# Patient Record
Sex: Female | Born: 1965 | Race: White | Hispanic: No | Marital: Married | Smoking: Current every day smoker
Health system: Southern US, Community
[De-identification: ages and names within clinical notes are randomized; demographics above are authoritative.]

## PROBLEM LIST (undated history)

## (undated) DIAGNOSIS — R52 Pain, unspecified: Secondary | ICD-10-CM

## (undated) DIAGNOSIS — F4310 Post-traumatic stress disorder, unspecified: Secondary | ICD-10-CM

## (undated) DIAGNOSIS — I444 Left anterior fascicular block: Secondary | ICD-10-CM

## (undated) DIAGNOSIS — G4730 Sleep apnea, unspecified: Secondary | ICD-10-CM

## (undated) DIAGNOSIS — I2510 Atherosclerotic heart disease of native coronary artery without angina pectoris: Secondary | ICD-10-CM

## (undated) DIAGNOSIS — F32A Depression, unspecified: Secondary | ICD-10-CM

## (undated) DIAGNOSIS — R519 Headache, unspecified: Secondary | ICD-10-CM

## (undated) DIAGNOSIS — I10 Essential (primary) hypertension: Secondary | ICD-10-CM

## (undated) DIAGNOSIS — I350 Nonrheumatic aortic (valve) stenosis: Secondary | ICD-10-CM

## (undated) DIAGNOSIS — F329 Major depressive disorder, single episode, unspecified: Secondary | ICD-10-CM

## (undated) DIAGNOSIS — R51 Headache: Secondary | ICD-10-CM

## (undated) DIAGNOSIS — E119 Type 2 diabetes mellitus without complications: Secondary | ICD-10-CM

## (undated) DIAGNOSIS — I509 Heart failure, unspecified: Secondary | ICD-10-CM

## (undated) HISTORY — DX: Post-traumatic stress disorder, unspecified: F43.10

## (undated) HISTORY — DX: Nonrheumatic aortic (valve) stenosis: I35.0

## (undated) HISTORY — DX: Sleep apnea, unspecified: G47.30

## (undated) HISTORY — PX: ADENOIDECTOMY: SUR15

## (undated) HISTORY — DX: Atherosclerotic heart disease of native coronary artery without angina pectoris: I25.10

## (undated) HISTORY — PX: TONSILLECTOMY: SUR1361

## (undated) HISTORY — DX: Left anterior fascicular block: I44.4

---

## 2004-01-01 ENCOUNTER — Emergency Department (HOSPITAL_COMMUNITY): Admission: EM | Admit: 2004-01-01 | Discharge: 2004-01-01 | Payer: Self-pay | Admitting: Family Medicine

## 2004-10-16 ENCOUNTER — Emergency Department (HOSPITAL_COMMUNITY): Admission: EM | Admit: 2004-10-16 | Discharge: 2004-10-16 | Payer: Self-pay | Admitting: Family Medicine

## 2004-12-10 ENCOUNTER — Emergency Department (HOSPITAL_COMMUNITY): Admission: EM | Admit: 2004-12-10 | Discharge: 2004-12-10 | Payer: Self-pay | Admitting: Emergency Medicine

## 2004-12-21 ENCOUNTER — Emergency Department (HOSPITAL_COMMUNITY): Admission: EM | Admit: 2004-12-21 | Discharge: 2004-12-21 | Payer: Self-pay | Admitting: Emergency Medicine

## 2004-12-21 ENCOUNTER — Emergency Department (HOSPITAL_COMMUNITY): Admission: EM | Admit: 2004-12-21 | Discharge: 2004-12-21 | Payer: Self-pay | Admitting: Family Medicine

## 2007-07-10 ENCOUNTER — Emergency Department (HOSPITAL_COMMUNITY): Admission: EM | Admit: 2007-07-10 | Discharge: 2007-07-10 | Payer: Self-pay | Admitting: Emergency Medicine

## 2008-03-20 ENCOUNTER — Emergency Department (HOSPITAL_COMMUNITY): Admission: EM | Admit: 2008-03-20 | Discharge: 2008-03-20 | Payer: Self-pay | Admitting: Emergency Medicine

## 2008-04-02 ENCOUNTER — Emergency Department (HOSPITAL_COMMUNITY): Admission: EM | Admit: 2008-04-02 | Discharge: 2008-04-02 | Payer: Self-pay | Admitting: Emergency Medicine

## 2008-06-22 ENCOUNTER — Emergency Department (HOSPITAL_COMMUNITY): Admission: EM | Admit: 2008-06-22 | Discharge: 2008-06-22 | Payer: Self-pay | Admitting: Emergency Medicine

## 2008-11-08 ENCOUNTER — Emergency Department (HOSPITAL_COMMUNITY): Admission: EM | Admit: 2008-11-08 | Discharge: 2008-11-08 | Payer: Self-pay | Admitting: Emergency Medicine

## 2010-12-08 LAB — DIFFERENTIAL
Basophils Absolute: 0.1
Basophils Relative: 0
Eosinophils Absolute: 0.4
Lymphs Abs: 1.8
Neutro Abs: 14 — ABNORMAL HIGH
Neutrophils Relative %: 82 — ABNORMAL HIGH

## 2010-12-08 LAB — CBC
HCT: 42.2
MCHC: 33.9
Platelets: 262
RDW: 13.1

## 2011-01-19 ENCOUNTER — Encounter: Payer: Self-pay | Admitting: *Deleted

## 2011-01-19 ENCOUNTER — Emergency Department (HOSPITAL_COMMUNITY)
Admission: EM | Admit: 2011-01-19 | Discharge: 2011-01-19 | Disposition: A | Discharge status: Home / Self Care | Payer: Self-pay | Attending: Emergency Medicine | Admitting: Emergency Medicine

## 2011-01-19 ENCOUNTER — Emergency Department (HOSPITAL_COMMUNITY): Payer: Self-pay

## 2011-01-19 DIAGNOSIS — R0602 Shortness of breath: Secondary | ICD-10-CM | POA: Insufficient documentation

## 2011-01-19 DIAGNOSIS — J40 Bronchitis, not specified as acute or chronic: Secondary | ICD-10-CM | POA: Insufficient documentation

## 2011-01-19 DIAGNOSIS — R059 Cough, unspecified: Secondary | ICD-10-CM | POA: Insufficient documentation

## 2011-01-19 DIAGNOSIS — R5381 Other malaise: Secondary | ICD-10-CM | POA: Insufficient documentation

## 2011-01-19 DIAGNOSIS — J45909 Unspecified asthma, uncomplicated: Secondary | ICD-10-CM | POA: Insufficient documentation

## 2011-01-19 DIAGNOSIS — R05 Cough: Secondary | ICD-10-CM | POA: Insufficient documentation

## 2011-01-19 DIAGNOSIS — R5383 Other fatigue: Secondary | ICD-10-CM | POA: Insufficient documentation

## 2011-01-19 HISTORY — DX: Pain, unspecified: R52

## 2011-01-19 MED ORDER — PREDNISONE 10 MG PO TABS: 20.0000 mg | ORAL_TABLET | Freq: Every day | ORAL | Status: AC

## 2011-01-19 MED ORDER — AZITHROMYCIN 250 MG PO TABS: 250.0000 mg | ORAL_TABLET | Freq: Every day | ORAL | Status: AC

## 2011-01-19 MED ORDER — ALBUTEROL SULFATE (5 MG/ML) 0.5% IN NEBU
5.0000 mg | INHALATION_SOLUTION | RESPIRATORY_TRACT | Status: AC
  Administered 2011-01-19: 5 mg via RESPIRATORY_TRACT
  Filled 2011-01-19 (×2): qty 0.5

## 2011-01-19 MED ORDER — IPRATROPIUM BROMIDE 0.02 % IN SOLN
0.5000 mg | RESPIRATORY_TRACT | Status: DC
  Administered 2011-01-19: 0.5 mg via RESPIRATORY_TRACT
  Filled 2011-01-19: qty 2.5

## 2011-01-19 MED ORDER — ALBUTEROL SULFATE HFA 108 (90 BASE) MCG/ACT IN AERS
2.0000 | INHALATION_SPRAY | Freq: Once | RESPIRATORY_TRACT | Status: AC
  Administered 2011-01-19: 2 via RESPIRATORY_TRACT
  Filled 2011-01-19: qty 6.7

## 2011-01-19 MED ORDER — ALBUTEROL SULFATE (5 MG/ML) 0.5% IN NEBU
2.5000 mg | INHALATION_SOLUTION | RESPIRATORY_TRACT | Status: DC
  Administered 2011-01-19: 2.5 mg via RESPIRATORY_TRACT
  Filled 2011-01-19: qty 0.5

## 2011-01-19 NOTE — ED Notes (Signed)
Patient states she noted onset of feeling like she had the flu on yesterday.  She states she noted her chest getting tight,  She is having increased shortness of breath.  Patient reports cough as well.  Patient denies fever.  Patient states she has not needed inhaler for over 1 year

## 2011-01-19 NOTE — ED Provider Notes (Signed)
History     CSN: [id number] Arrival date & time: 01/19/2011 12:54 PM   First MD Initiated Contact with Patient 01/19/11 1516      Chief Complaint  Patient presents with  . Shortness of Breath    (Consider location/radiation/quality/duration/timing/severity/associated sxs/prior treatment) Patient is a 45 y.o. female presenting with shortness of breath. The history is provided by the patient.  Shortness of Breath  The current episode started 2 days ago. The problem occurs occasionally. The problem has been gradually worsening. The problem is moderate. The symptoms are relieved by nothing. The symptoms are aggravated by activity. Associated symptoms include cough, shortness of breath and wheezing. Pertinent negatives include no chest pain, no chest pressure and no fever. She has had no prior steroid use. Her past medical history is significant for asthma and past wheezing. She has been behaving normally.    Past Medical History  Diagnosis Date  . Asthma   . Pain disorder     Past Surgical History  Procedure Date  . Tonsillectomy   . Adenoidectomy     No family history on file.  History  Substance Use Topics  . Smoking status: Current Everyday Smoker  . Smokeless tobacco: Not on file  . Alcohol Use: Yes    OB History    Grav Para Term Preterm Abortions TAB SAB Ect Mult Living                  Review of Systems  Constitutional: Positive for fatigue. Negative for fever and chills.  HENT: Negative for neck pain and neck stiffness.   Respiratory: Positive for cough, shortness of breath and wheezing.   Cardiovascular: Negative for chest pain.  Gastrointestinal: Negative for abdominal pain, diarrhea, constipation and abdominal distention.  All other systems reviewed and are negative.    Allergies  Review of patient's allergies indicates no known allergies.  Home Medications   Current Outpatient Rx  Name Route Sig Dispense Refill  . BC HEADACHE POWDER PO Oral Take  1 packet by mouth 4 (four) times daily as needed. For pain     . ALBUTEROL SULFATE HFA 108 (90 BASE) MCG/ACT IN AERS Inhalation Inhale 2 puffs into the lungs every 4 (four) hours as needed. For shortness of breath       BP 164/104  Pulse 100  Temp 98.5 F (36.9 C)  Resp 24  SpO2 95%  Physical Exam  Constitutional: She is oriented to person, place, and time. She appears well-developed and well-nourished. No distress.  HENT:  Head: Atraumatic.  Neck: Normal range of motion. Neck supple.  Cardiovascular: Normal rate and regular rhythm.  Exam reveals no gallop and no friction rub.   No murmur heard. Pulmonary/Chest: No respiratory distress. She has wheezes. She has no rales.  Abdominal: Soft. Bowel sounds are normal. She exhibits no distension. There is no tenderness.  Musculoskeletal: Normal range of motion.  Neurological: She is alert and oriented to person, place, and time.  Skin: Skin is warm and dry. She is not diaphoretic.    ED Course  Procedures (including critical care time)  Labs Reviewed - No data to display No results found.   No diagnosis found.    MDM  Bronchitis, will treat with antibiotics, mdi, steroids.        Geoffery Lyons, MD 01/19/11 [phone]

## 2011-01-19 NOTE — ED Notes (Signed)
Pt taken to xray without notifying ED staff prior to pt being marked ready for transport.  Due to that a Margo Aye ass was not generated prior to pt leaving dept.

## 2014-06-17 ENCOUNTER — Inpatient Hospital Stay (HOSPITAL_COMMUNITY): Payer: Self-pay

## 2014-06-17 ENCOUNTER — Emergency Department (HOSPITAL_COMMUNITY): Payer: Self-pay

## 2014-06-17 ENCOUNTER — Inpatient Hospital Stay (HOSPITAL_COMMUNITY)
Admission: EM | Admit: 2014-06-17 | Discharge: 2014-06-20 | DRG: 292 | Disposition: A | Discharge status: Home / Self Care | Payer: Self-pay | Attending: Internal Medicine | Admitting: Internal Medicine

## 2014-06-17 ENCOUNTER — Encounter (HOSPITAL_COMMUNITY): Payer: Self-pay

## 2014-06-17 DIAGNOSIS — I5031 Acute diastolic (congestive) heart failure: Secondary | ICD-10-CM | POA: Diagnosis present

## 2014-06-17 DIAGNOSIS — Z720 Tobacco use: Secondary | ICD-10-CM | POA: Diagnosis present

## 2014-06-17 DIAGNOSIS — G4733 Obstructive sleep apnea (adult) (pediatric): Secondary | ICD-10-CM | POA: Diagnosis present

## 2014-06-17 DIAGNOSIS — M549 Dorsalgia, unspecified: Secondary | ICD-10-CM | POA: Insufficient documentation

## 2014-06-17 DIAGNOSIS — E662 Morbid (severe) obesity with alveolar hypoventilation: Secondary | ICD-10-CM | POA: Diagnosis present

## 2014-06-17 DIAGNOSIS — Z6841 Body Mass Index (BMI) 40.0 and over, adult: Secondary | ICD-10-CM

## 2014-06-17 DIAGNOSIS — I152 Hypertension secondary to endocrine disorders: Secondary | ICD-10-CM | POA: Diagnosis present

## 2014-06-17 DIAGNOSIS — I509 Heart failure, unspecified: Secondary | ICD-10-CM

## 2014-06-17 DIAGNOSIS — I10 Essential (primary) hypertension: Secondary | ICD-10-CM | POA: Diagnosis present

## 2014-06-17 DIAGNOSIS — R0789 Other chest pain: Secondary | ICD-10-CM | POA: Diagnosis present

## 2014-06-17 DIAGNOSIS — E119 Type 2 diabetes mellitus without complications: Secondary | ICD-10-CM | POA: Diagnosis present

## 2014-06-17 DIAGNOSIS — J449 Chronic obstructive pulmonary disease, unspecified: Secondary | ICD-10-CM | POA: Diagnosis present

## 2014-06-17 DIAGNOSIS — J45909 Unspecified asthma, uncomplicated: Secondary | ICD-10-CM | POA: Diagnosis present

## 2014-06-17 DIAGNOSIS — F17210 Nicotine dependence, cigarettes, uncomplicated: Secondary | ICD-10-CM | POA: Diagnosis present

## 2014-06-17 DIAGNOSIS — M545 Low back pain: Secondary | ICD-10-CM | POA: Diagnosis present

## 2014-06-17 DIAGNOSIS — I5033 Acute on chronic diastolic (congestive) heart failure: Principal | ICD-10-CM | POA: Diagnosis present

## 2014-06-17 DIAGNOSIS — G8929 Other chronic pain: Secondary | ICD-10-CM | POA: Diagnosis present

## 2014-06-17 DIAGNOSIS — I248 Other forms of acute ischemic heart disease: Secondary | ICD-10-CM | POA: Diagnosis present

## 2014-06-17 HISTORY — DX: Headache, unspecified: R51.9

## 2014-06-17 HISTORY — DX: Heart failure, unspecified: I50.9

## 2014-06-17 HISTORY — DX: Headache: R51

## 2014-06-17 HISTORY — DX: Depression, unspecified: F32.A

## 2014-06-17 HISTORY — DX: Major depressive disorder, single episode, unspecified: F32.9

## 2014-06-17 HISTORY — DX: Essential (primary) hypertension: I10

## 2014-06-17 LAB — BASIC METABOLIC PANEL
Anion gap: 10 (ref 5–15)
BUN: 9 mg/dL (ref 6–23)
CALCIUM: 9 mg/dL (ref 8.4–10.5)
CO2: 25 mmol/L (ref 19–32)
CREATININE: 0.69 mg/dL (ref 0.50–1.10)
Chloride: 102 mmol/L (ref 96–112)
Glucose, Bld: 108 mg/dL — ABNORMAL HIGH (ref 70–99)
POTASSIUM: 4.1 mmol/L (ref 3.5–5.1)
Sodium: 137 mmol/L (ref 135–145)

## 2014-06-17 LAB — CBC
HEMATOCRIT: 46.8 % — AB (ref 36.0–46.0)
Hemoglobin: 15.1 g/dL — ABNORMAL HIGH (ref 12.0–15.0)
MCH: 31.8 pg (ref 26.0–34.0)
MCHC: 32.3 g/dL (ref 30.0–36.0)
MCV: 98.5 fL (ref 78.0–100.0)
PLATELETS: 209 10*3/uL (ref 150–400)
RBC: 4.75 MIL/uL (ref 3.87–5.11)
RDW: 14.6 % (ref 11.5–15.5)
WBC: 8.9 10*3/uL (ref 4.0–10.5)

## 2014-06-17 LAB — TROPONIN I: Troponin I: 0.06 ng/mL — ABNORMAL HIGH (ref <0.031)

## 2014-06-17 LAB — I-STAT TROPONIN, ED: TROPONIN I, POC: 0.01 ng/mL (ref 0.00–0.08)

## 2014-06-17 LAB — BRAIN NATRIURETIC PEPTIDE: B NATRIURETIC PEPTIDE 5: 212.4 pg/mL — AB (ref 0.0–100.0)

## 2014-06-17 LAB — TSH: TSH: 0.349 u[IU]/mL — ABNORMAL LOW (ref 0.350–4.500)

## 2014-06-17 MED ORDER — SODIUM CHLORIDE 0.9 % IV SOLN: 250.0000 mL | INTRAVENOUS | Status: DC | PRN

## 2014-06-17 MED ORDER — PREDNISONE 20 MG PO TABS
60.0000 mg | ORAL_TABLET | Freq: Once | ORAL | Status: AC
Start: 2014-06-17 — End: 2014-06-17
  Administered 2014-06-17: 60 mg via ORAL
  Filled 2014-06-17: qty 3

## 2014-06-17 MED ORDER — ASPIRIN EC 81 MG PO TBEC
81.0000 mg | DELAYED_RELEASE_TABLET | Freq: Every day | ORAL | Status: DC
  Administered 2014-06-18 – 2014-06-20 (×3): 81 mg via ORAL
  Filled 2014-06-17 (×4): qty 1

## 2014-06-17 MED ORDER — ACETAMINOPHEN 325 MG PO TABS: 650.0000 mg | ORAL_TABLET | ORAL | Status: DC | PRN

## 2014-06-17 MED ORDER — FUROSEMIDE 10 MG/ML IJ SOLN
20.0000 mg | Freq: Once | INTRAMUSCULAR | Status: AC
  Administered 2014-06-17: 20 mg via INTRAVENOUS
  Filled 2014-06-17: qty 2

## 2014-06-17 MED ORDER — ENOXAPARIN SODIUM 40 MG/0.4ML ~~LOC~~ SOLN
40.0000 mg | SUBCUTANEOUS | Status: DC
Start: 2014-06-17 — End: 2014-06-18
  Administered 2014-06-17: 40 mg via SUBCUTANEOUS
  Filled 2014-06-17: qty 0.4

## 2014-06-17 MED ORDER — IPRATROPIUM-ALBUTEROL 0.5-2.5 (3) MG/3ML IN SOLN
3.0000 mL | Freq: Once | RESPIRATORY_TRACT | Status: AC
  Administered 2014-06-17: 3 mL via RESPIRATORY_TRACT
  Filled 2014-06-17: qty 3

## 2014-06-17 MED ORDER — ONDANSETRON HCL 4 MG/2ML IJ SOLN: 4.0000 mg | Freq: Four times a day (QID) | INTRAMUSCULAR | Status: DC | PRN

## 2014-06-17 MED ORDER — NICOTINE 21 MG/24HR TD PT24
21.0000 mg | MEDICATED_PATCH | Freq: Every day | TRANSDERMAL | Status: DC
  Administered 2014-06-17: 21 mg via TRANSDERMAL
  Filled 2014-06-17: qty 1

## 2014-06-17 MED ORDER — SODIUM CHLORIDE 0.9 % IJ SOLN
3.0000 mL | Freq: Two times a day (BID) | INTRAMUSCULAR | Status: DC
  Administered 2014-06-17 – 2014-06-20 (×6): 3 mL via INTRAVENOUS

## 2014-06-17 MED ORDER — FUROSEMIDE 10 MG/ML IJ SOLN
40.0000 mg | Freq: Two times a day (BID) | INTRAMUSCULAR | Status: DC
  Administered 2014-06-17 – 2014-06-19 (×4): 40 mg via INTRAVENOUS
  Filled 2014-06-17 (×4): qty 4

## 2014-06-17 MED ORDER — TRAMADOL HCL 50 MG PO TABS
50.0000 mg | ORAL_TABLET | Freq: Four times a day (QID) | ORAL | Status: DC | PRN
  Administered 2014-06-18 – 2014-06-20 (×9): 50 mg via ORAL
  Filled 2014-06-17 (×9): qty 1

## 2014-06-17 MED ORDER — CARVEDILOL 3.125 MG PO TABS
3.1250 mg | ORAL_TABLET | Freq: Two times a day (BID) | ORAL | Status: DC
  Administered 2014-06-17 – 2014-06-18 (×2): 3.125 mg via ORAL
  Filled 2014-06-17 (×4): qty 1

## 2014-06-17 MED ORDER — SODIUM CHLORIDE 0.9 % IJ SOLN: 3.0000 mL | INTRAMUSCULAR | Status: DC | PRN

## 2014-06-17 MED ORDER — LISINOPRIL 5 MG PO TABS
5.0000 mg | ORAL_TABLET | Freq: Every day | ORAL | Status: DC
  Administered 2014-06-17 – 2014-06-19 (×3): 5 mg via ORAL
  Filled 2014-06-17 (×3): qty 1

## 2014-06-17 NOTE — ED Notes (Signed)
Attempted report 

## 2014-06-17 NOTE — ED Notes (Signed)
Pt. States weight gain is about 34lbs in 2 weeks.

## 2014-06-17 NOTE — ED Notes (Signed)
Pt. Reports SOB with laying flat x1 month, states SOB is progressively getting worse. Reports edema to bilateral legs.   Denise hx of CHF. Reports intermittent chest palpitations. Pt. SOB in triage. Hx of asthma, and family hx of early heart problems.

## 2014-06-17 NOTE — ED Notes (Signed)
Pt is suppose to be on blood pressure medicine for the past 4 years but she doesn't take it because she can't afford it.  She also needs a long term asthma medicine

## 2014-06-17 NOTE — ED Notes (Signed)
PT returned from scans. Monitored by pulse ox, bp cuff, and 5-lead.

## 2014-06-17 NOTE — H&P (Signed)
History and Physical  Tracy Fitzgerald YPP:[id number] DOB: 12-25-1965 DOA: 06/17/2014   PCP: No primary care provider on file.   Chief Complaint: Dyspnea  HPI:  49 year old female with a history of asthma, hypertension, continued tobacco abuse presents with one-month history of progressive shortness of breath. Over the past 2-3 days, her shortness of breath has progressively worsened to the point where she is having much difficulty walking to the bathroom. In addition, the patient has been complaining of symptoms consistent with orthopnea and PND over the past week. She has noted increasing lower extremity edema over the past 2 weeks. She has had some intermittent chest discomfort that occurs randomly without any alleviating or exacerbating factors. The patient denies any fevers, chills, nausea, vomiting, diarrhea. She has had a nonproductive cough. Unfortunately, the patient continues to smoke. She used to smoke 2 packs per day but is down to 1 pack per day at this time as is at this time. The patient states that she has had to take ibuprofen and BC powder on a daily basis for her chronic low back pain. She states that she takes 12-16 ibuprofen daily with 2-4 BC powders daily. She has done this for a number of years secondary to her chronic back pain. The patient states that she did recently have a mechanical fall approximately 2 months ago. Patient denies any dysuria, hematuria, hematochezia, melena, abdominal pain.  In the emergency department, the patient was given albuterol as well as furosemide 20 mg IV 1. She is afebrile. She was hypertensive with blood pressure 187/92. Oxygen saturation was 96-100% on room air. EKG was sinus rhythm without ST-T wave changes. Chest x-ray shows increased tone or vascularity.   Assessment/Plan: Acute CHF -Continue furosemide 40 mg IV twice a day -Echocardiogram -Pending further workup, start carvedilol and lisinopril -Daily weights -Strict I/O--patient has  requested a Foley catheter placed -Patient is clinically fluid overloaded with JVD and S3 on exam Tobacco abuse -Tobacco cessation discussed -NicoDerm patch Chronic low back pain -Discontinue NSAIDs -X-ray lumbar spine -Tramadol for moderate pain Atypical chest pain  -Cycle troponins  -EKG without any concerning ischemic changes  Lower extremity pain and edema -venous duplex HTN -expect improvement with diuresis -lisinopril and carvedilol have been started -stop NSAIDS     Past Medical History  Diagnosis Date  . Asthma   . Pain disorder   . Hypertension    Past Surgical History  Procedure Laterality Date  . Tonsillectomy    . Adenoidectomy     Social History:  reports that she has been smoking Cigarettes.  She has been smoking about 0.50 packs per day. She does not have any smokeless tobacco history on file. She reports that she drinks alcohol. She reports that she does not use illicit drugs.   No family history on file.   No Known Allergies    Prior to Admission medications   Medication Sig Start Date End Date Taking? Authorizing Provider  albuterol (PROVENTIL HFA;VENTOLIN HFA) 108 (90 BASE) MCG/ACT inhaler Inhale 2 puffs into the lungs every 4 (four) hours as needed. For shortness of breath    Yes Historical Provider, MD  Aspirin-Salicylamide-Caffeine (BC HEADACHE POWDER PO) Take 1 packet by mouth 4 (four) times daily as needed. For pain    Yes Historical Provider, MD  ibuprofen (ADVIL,MOTRIN) 200 MG tablet Take 800 mg by mouth every 6 (six) hours as needed for moderate pain.   Yes Historical Provider, MD    Review of Systems:  Constitutional:  No weight loss, night sweats, Fevers, chills, fatigue.  Head&Eyes: No headache.  No vision loss.  No eye pain or scotoma ENT:  No Difficulty swallowing,Tooth/dental problems,Sore throat,   Cardio-vascular:  NoOrthopnea, PND, swelling in lower extremities,  dizziness, palpitations  GI:  No  abdominal pain, nausea,  vomiting, diarrhea, loss of appetite, hematochezia, melena, heartburn, indigestion, Resp:   No cough. No coughing up of blood .No wheezing.No chest wall deformity  Skin:  no rash or lesions.  GU:  no dysuria, change in color of urine, no urgency or frequency. No flank pain.  Musculoskeletal:  No joint pain or swelling. No decreased range of motion.   Psych:  No change in mood or affect.  Neurologic: No headache, no dysesthesia, no focal weakness, no vision loss. No syncope  Physical Exam: Filed Vitals:   06/17/14 1248 06/17/14 1300 06/17/14 1315 06/17/14 1345  BP: 174/93 187/92 168/81 182/102  Pulse:  94 101 103  Temp:      TempSrc:      Resp:  24 19 15   Weight:      SpO2:  96% 99% 98%   General:  A&O x 3, NAD, nontoxic, pleasant/cooperative Head/Eye: No conjunctival hemorrhage, no icterus, [place]/AT, No nystagmus ENT:  No icterus,  No thrush, good dentition, no pharyngeal exudate Neck:  No masses, no lymphadenpathy, no bruits; +JVD CV:  RRR, no rub, no gallop, + S3 Lung:  Bibasilar crackles. No wheeze. Abdomen: soft/NT, +BS, nondistended, no peritoneal signs Ext: No cyanosis, No rashes, No petechiae, No lymphangitis, 2+ LE edema Neuro: CNII-XII intact, strength 4/5 in bilateral upper and lower extremities, no dysmetria  Labs on Admission:  Basic Metabolic Panel:  Recent Labs Lab 06/17/14 1155  NA 137  K 4.1  CL 102  CO2 25  GLUCOSE 108*  BUN 9  CREATININE 0.69  CALCIUM 9.0   Liver Function Tests: No results for input(s): AST, ALT, ALKPHOS, BILITOT, PROT, ALBUMIN in the last 168 hours. No results for input(s): LIPASE, AMYLASE in the last 168 hours. No results for input(s): AMMONIA in the last 168 hours. CBC:  Recent Labs Lab 06/17/14 1155  WBC 8.9  HGB 15.1*  HCT 46.8*  MCV 98.5  PLT 209   Cardiac Enzymes: No results for input(s): CKTOTAL, CKMB, CKMBINDEX, TROPONINI in the last 168 hours. BNP: Invalid input(s): POCBNP CBG: No results for input(s):  GLUCAP in the last 168 hours.  Radiological Exams on Admission: Dg Chest 2 View  06/17/2014   CLINICAL DATA:  One month history of mid chest pain and shortness of breath, lower extremity swelling, history of asthmatic bronchitis, current smoker.  EXAM: CHEST  2 VIEW  COMPARISON:  PA and lateral chest of January 19, 2011  FINDINGS: The lungs are hyperinflated with hemidiaphragm flattening. Interstitial markings are coarse bilaterally and have increased. The cardiac silhouette is normal in size. The central pulmonary vascularity is mildly prominent. There is new minimal cephalization of the vascular pattern. The mediastinum is normal in width. There is no pleural effusion or pneumothorax. The bony thorax is unremarkable.  IMPRESSION: COPD with chronic bronchitic changes. However, increased conspicuity of the pulmonary vascularity suggests low-grade CHF. Follow-up radiographs following anticipated antibiotic and possibly diuretic therapy are recommended to reassess the lung parenchyma.   Electronically Signed   By: Hoover Grewe  [place]   On: 06/17/2014 12:40    EKG: Independently reviewed. Sinus without ST-T changes    Time spent:60 minutes Code Status:  FULL Family Communication:   Husband  updated at bedside   Sevin Langenbach, DO  [facility] Pager [phone]  If 7PM-7AM, please contact night-coverage [email or web address] Password [email or web address] 06/17/2014, 2:56 PM

## 2014-06-17 NOTE — ED Provider Notes (Signed)
CSN: [id number]     Arrival date & time 06/17/14  1043 History   First MD Initiated Contact with Patient 06/17/14 1123     Chief Complaint  Patient presents with  . Shortness of Breath  . Chest Pain     (Consider location/radiation/quality/duration/timing/severity/associated sxs/prior Treatment) HPI   49 year old female with past history of asthma, hypertension, comes in with shortness of breath which has progressively been getting worse for last month. Also endorses intermittent palpitations and increased lower swelling as well as weight gain (34 lb in 2 weeks). No chest pain currently no cough, no fevers. She is a one pack-a-day smoker.  Past Medical History  Diagnosis Date  . Asthma   . Pain disorder   . Hypertension    Past Surgical History  Procedure Laterality Date  . Tonsillectomy    . Adenoidectomy     No family history on file. History  Substance Use Topics  . Smoking status: Current Every Day Smoker -- 0.50 packs/day    Types: Cigarettes  . Smokeless tobacco: Not on file  . Alcohol Use: Yes     Comment: occasionally   OB History    No data available     Review of Systems  Constitutional: Negative for fever and chills.  HENT: Negative for nosebleeds.   Eyes: Negative for visual disturbance.  Respiratory: Positive for shortness of breath. Negative for cough.   Cardiovascular: Positive for leg swelling. Negative for chest pain.  Gastrointestinal: Negative for nausea, vomiting, abdominal pain, diarrhea and constipation.  Genitourinary: Negative for dysuria.  Skin: Negative for rash.  Neurological: Negative for weakness.  All other systems reviewed and are negative.     Allergies  Review of patient's allergies indicates no known allergies.  Home Medications   Prior to Admission medications   Medication Sig Start Date End Date Taking? Authorizing Provider  albuterol (PROVENTIL HFA;VENTOLIN HFA) 108 (90 BASE) MCG/ACT inhaler Inhale 2 puffs into the lungs  every 4 (four) hours as needed. For shortness of breath     Historical Provider, MD  Aspirin-Salicylamide-Caffeine (BC HEADACHE POWDER PO) Take 1 packet by mouth 4 (four) times daily as needed. For pain     Historical Provider, MD   BP 174/112 mmHg  Pulse 103  Temp(Src) 97.7 F (36.5 C) (Oral)  Resp 22  Wt 332 lb 14.3 oz (151 kg)  SpO2 98%  LMP 05/27/2014 Physical Exam  Constitutional: She is oriented to person, place, and time. No distress.  HENT:  Head: Normocephalic and atraumatic.  Eyes: EOM are normal. Pupils are equal, round, and reactive to light.  Neck: Normal range of motion. Neck supple.  Cardiovascular: Normal rate and intact distal pulses.   Pulmonary/Chest: No respiratory distress. She has wheezes (diffuse bilat).  Abdominal: Soft. There is no tenderness.  Musculoskeletal: Normal range of motion. She exhibits tenderness (2+ on BL LE).  Neurological: She is alert and oriented to person, place, and time.  Skin: No rash noted. She is not diaphoretic.  Psychiatric: She has a normal mood and affect.    ED Course  Procedures (including critical care time) Labs Review Labs Reviewed  [place], ED    Imaging Review No results found.   EKG Interpretation None      MDM   Final diagnoses:  None    49 year old female with past history of asthma, hypertension, comes in with shortness of breath which has progressively been getting worse for  last month.  Exam as above satting well on room air, hypertensive at 863 systolic, mild tachycardia. She is diffusely wheezy on exam with pretty good air movement. Normal work of breathing. Mild tachypnea.  Will give albut  Concern for COPD versus asthma versus CHF. Doubt ACS given wheezing on exam and no current chest pain EKG without any ischemic changes will get a trop to screen. Also get a BNP to evaluate for CHF. Will get chest x-ray BASIC labs.  Patient  having improvement with albuterol will give prednisone. Doubt PE given she has improvement with albuterol. Has bilateral LE edema w/o asymmetry, no previous DVT PE, no recent surgeries, no periods of immobilization.  BNP elevated and some mild edema on CXR  Will admit for eval of possible new onset CHF    Jarome Matin, MD 06/17/14 [place], MD 06/17/14 [phone]

## 2014-06-17 NOTE — ED Notes (Signed)
Attempted to start IV in left [facility] but not successful.  Another nurse started IV.

## 2014-06-18 ENCOUNTER — Encounter (HOSPITAL_COMMUNITY): Payer: Self-pay | Admitting: General Practice

## 2014-06-18 DIAGNOSIS — M79609 Pain in unspecified limb: Secondary | ICD-10-CM

## 2014-06-18 DIAGNOSIS — I509 Heart failure, unspecified: Secondary | ICD-10-CM

## 2014-06-18 DIAGNOSIS — R0789 Other chest pain: Secondary | ICD-10-CM

## 2014-06-18 DIAGNOSIS — M7989 Other specified soft tissue disorders: Secondary | ICD-10-CM

## 2014-06-18 LAB — BASIC METABOLIC PANEL
Anion gap: 6 (ref 5–15)
BUN: 10 mg/dL (ref 6–23)
CHLORIDE: 102 mmol/L (ref 96–112)
CO2: 30 mmol/L (ref 19–32)
Calcium: 9.1 mg/dL (ref 8.4–10.5)
Creatinine, Ser: 0.7 mg/dL (ref 0.50–1.10)
GFR calc non Af Amer: >90 mL/min (ref >90)
Glucose, Bld: 108 mg/dL — ABNORMAL HIGH (ref 70–99)
POTASSIUM: 4.2 mmol/L (ref 3.5–5.1)
Sodium: 138 mmol/L (ref 135–145)

## 2014-06-18 LAB — TROPONIN I
TROPONIN I: 0.06 ng/mL — AB (ref <0.031)
Troponin I: 0.03 ng/mL (ref <0.031)

## 2014-06-18 LAB — HIV ANTIBODY (ROUTINE TESTING W REFLEX): HIV Screen 4th Generation wRfx: NONREACTIVE

## 2014-06-18 LAB — MAGNESIUM: Magnesium: 2 mg/dL (ref 1.5–2.5)

## 2014-06-18 MED ORDER — NICOTINE 21 MG/24HR TD PT24
21.0000 mg | MEDICATED_PATCH | TRANSDERMAL | Status: DC
  Administered 2014-06-18 – 2014-06-19 (×2): 21 mg via TRANSDERMAL
  Filled 2014-06-18 (×2): qty 1

## 2014-06-18 MED ORDER — ENOXAPARIN SODIUM 40 MG/0.4ML ~~LOC~~ SOLN
40.0000 mg | SUBCUTANEOUS | Status: DC
Start: 2014-06-18 — End: 2014-06-20
  Administered 2014-06-18 – 2014-06-19 (×2): 40 mg via SUBCUTANEOUS
  Filled 2014-06-18 (×2): qty 0.4

## 2014-06-18 MED ORDER — METOPROLOL TARTRATE 25 MG PO TABS
25.0000 mg | ORAL_TABLET | Freq: Two times a day (BID) | ORAL | Status: DC
  Administered 2014-06-18 – 2014-06-20 (×5): 25 mg via ORAL
  Filled 2014-06-18 (×5): qty 1

## 2014-06-18 NOTE — Progress Notes (Signed)
Heart Failure Navigator Consult Note  Presentation: Tracy Fitzgerald is a 49 year old female with a history of asthma, hypertension, continued tobacco abuse presents with one-month history of progressive shortness of breath. Over the past 2-3 days, her shortness of breath has progressively worsened to the point where she is having much difficulty walking to the bathroom. In addition, the patient has been complaining of symptoms consistent with orthopnea and PND over the past week. She has noted increasing lower extremity edema over the past 2 weeks. She has had some intermittent chest discomfort that occurs randomly without any alleviating or exacerbating factors. The patient denies any fevers, chills, nausea, vomiting, diarrhea. She has had a nonproductive cough. Unfortunately, the patient continues to smoke. She used to smoke 2 packs per day but is down to 1 pack per day at this time as is at this time. The patient states that she has had to take ibuprofen and BC powder on a daily basis for her chronic low back pain. She states that she takes 12-16 ibuprofen daily with 2-4 BC powders daily. She has done this for a number of years secondary to her chronic back pain. The patient states that she did recently have a mechanical fall approximately 2 months ago. Patient denies any dysuria, hematuria, hematochezia, melena, abdominal pain.    Past Medical History  Diagnosis Date  . Asthma   . Pain disorder   . Hypertension   . CHF (congestive heart failure)   . Depression   . Headache     History   Social History  . Marital Status: Married    Spouse Name: N/A  . Number of Children: N/A  . Years of Education: N/A   Social History Main Topics  . Smoking status: Current Every Day Smoker -- 0.50 packs/day for 33 years    Types: Cigarettes  . Smokeless tobacco: Never Used  . Alcohol Use: Yes     Comment: occasionally  . Drug Use: No  . Sexual Activity: Not on file   Other Topics Concern  . None    Social History Narrative    ECHO:results pending  BNP    Component Value Date/Time   BNP 212.4* 06/17/2014 1156    ProBNP No results found for: PROBNP   Education Assessment and Provision:  Detailed education and instructions provided on heart failure disease management including the following:  Signs and symptoms of Heart Failure When to call the physician Importance of daily weights Low sodium diet Fluid restriction Medication management Anticipated future follow-up appointments  Patient education given on each of the above topics.  Patient acknowledges understanding and acceptance of all instructions.  I spoke at length with Tracy Fitzgerald about her HF.  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  She admits that she "eats bad".  She relates that she has "back issues" which prohibit her from being active.  She has 6 children (3 from prior marriage and 3 from current husband) whom live with other caregivers because "we just can't provide for them right now".  She tells me their only form of income currently is his disability check--which "only provides basics".  I stressed the importance in getting and taking all medications going forward to help her stay out of the hospital--she acknowledges understanding.  She was able to teach back about a low sodium diet and we discussed high sodium foods to avoid.  She does not have a functioning scale--I will attempt to provide  her with one before discharge.  She understands the importance of daily weights.   Education Materials:  "Living Better With Heart Failure" Booklet, Daily Weight Tracker Tool .   High Risk Criteria for Readmission and/or Poor Patient Outcomes:  (Recommend Follow-up with [facility])--yes -this patient could potentially benefit from HF team approach and outpatient clinical SW for ongoing financial issues.   EF <30%-pending  2 or more admissions in 6  months-No  Difficult social situation- Yes --severe financial constraints  Demonstrates medication noncompliance- yes secondary to finances   Barriers of Care:  Knowledge, financial, other medical issues and compliance related to prior  Discharge Planning:   Plans to discharge to home with husband.  She will be at high risk for readmission unless she can get medication assistance on an ongoing basis and connected with an outpatient provider.  She will need transportation assistance to and from appts as she cannot walk to the bus stops or afford other transport.

## 2014-06-18 NOTE — Care Management Note (Addendum)
    Page 1 of 2   06/18/2014     1:54:36 PM CARE MANAGEMENT NOTE 06/18/2014  Patient:  Abadi,Iracema   Account Number:  [id number]  Date Initiated:  06/18/2014  Documentation initiated by:  GRAVES-BIGELOW,Janet Humphreys  Subjective/Objective Assessment:   Pt admitted for Dyspnea- Acute CHF. Pt has family support, no insurance and no PCP.     Action/Plan:   CM did speak with pt in regards to PCP at the [facility] for hospital f/u. FC did speak with pt in regards to possible Medicaid. CM to [facility]. [facility] to touch base with pt in regards to CPAP.   Anticipated DC Date:  06/20/2014   Anticipated DC Plan:  HOME/SELF CARE  In-house referral  Financial Counselor      DC Planning Services  CM consult  Follow-up appt scheduled  [facility]  Medication Assistance      [facility] Choice  DURABLE MEDICAL EQUIPMENT   Choice offered to / List presented to:  C-1 Patient   DME arranged  CPAP      DME agency  [place].        Status of service:  In process, will continue to follow Medicare Important Message given?  NO (If response is "NO", the following Medicare IM given date fields will be blank) Date Medicare IM given:   Medicare IM given by:   Date Additional Medicare IM given:   Additional Medicare IM given by:    Discharge Disposition:    Per UR Regulation:  Reviewed for med. necessity/level of care/duration of stay  If discussed at [place] of Stay Meetings, dates discussed:    Comments:   1352 06-18-14 Jacqlyn Krauss, RN,BSN [phone] Per [facility] Liaison- due to pt being self pay. Pt will have to have sleep study done before CPAP can be given to pt. [facility] no longer provides [facility] for pt's without Insurance for CPAP. CM will continue to monitor.  06-18-14 [address], [place] [phone] CM did speak with pt in regards to [facility] for CHF management. Pt declines HH Services at this time. CM did go  over some nutrition facts with pt in reference to sodium intake. CM did call CHF Navigator and she will speak with pt before d/c. Scale will hopefully be provided to pt before d/c. [place] DME Liaison to speak with pt in ref to sliding scale fee for CPAP/02 needs before d/c. Pt will need generic Rx's for medications to be as cheap as possible. Pt is aware of Pharmacy on site at the [facility]. CSW to speak with pt in regards to transportation needs.  No further needs identified by CM at this time.

## 2014-06-18 NOTE — Progress Notes (Signed)
VASCULAR LAB PRELIMINARY  PRELIMINARY  PRELIMINARY  PRELIMINARY  BLEV completed.    Preliminary report:  NEGATIVE DVT BILATERALLY.  August Albino, RVT 06/18/2014, 12:12 PM

## 2014-06-18 NOTE — Progress Notes (Signed)
Tracy Fitzgerald QJF:[id number] DOB: April 10, 1965 DOA: 06/17/2014 PCP: No primary care provider on file.  Brief narrative:  49 y/o ? htn, DM ty 2, asthma-Tob Abuse, Chr LBP admit 06/17/14 with 1 mo prog SOB and dyspneoa. Found to be found to have mildy elevated troponin, ?  BNP, ? LE edema ~ 1 mo  Past medical history-As per Problem list Chart reviewed as below- reviewed  Consultants:  Cardiology  Procedures:  none  Antibiotics:  none   Subjective   SOB better NO n v cp nno blurred or double vision, no fever nor chills Feels overall better   Objective    Interim History:   Telemetry: sinus   Objective: Filed Vitals:   06/18/14 0514 06/18/14 0819 06/18/14 0952 06/18/14 1221  BP: 145/85 154/88 146/92 138/79  Pulse: 99 88  80  Temp: 98.7 F (37.1 C)     TempSrc: Oral     Resp: 18     Height:      Weight: 147.8 kg (325 lb 13.4 oz)     SpO2: 98%       Intake/Output Summary (Last 24 hours) at 06/18/14 1547 Last data filed at 06/18/14 0827  Gross per 24 hour  Intake   1156 ml  Output   4425 ml  Net  -3269 ml    Exam:  General: eoni, ncat Cardiovascular: s1 s2 no m, distant HS, cannot app JVD, no bruit Respiratory:  Clear no added sound Abdomen:  Soft, obese NT, ND Skin no LE edema Neuro intact  Data Reviewed: Basic Metabolic Panel:  Recent Labs Lab 06/17/14 1155 06/18/14 0330  NA 137 138  K 4.1 4.2  CL 102 102  CO2 25 30  GLUCOSE 108* 108*  BUN 9 10  CREATININE 0.69 0.70  CALCIUM 9.0 9.1   Liver Function Tests: No results for input(s): AST, ALT, ALKPHOS, BILITOT, PROT, ALBUMIN in the last 168 hours. No results for input(s): LIPASE, AMYLASE in the last 168 hours. No results for input(s): AMMONIA in the last 168 hours. CBC:  Recent Labs Lab 06/17/14 1155  WBC 8.9  HGB 15.1*  HCT 46.8*  MCV 98.5  PLT 209   Cardiac Enzymes:  Recent Labs Lab 06/17/14 1715 06/17/14 2325 06/18/14 0330  TROPONINI 0.06* 0.03 0.06*   BNP: Invalid  input(s): POCBNP CBG: No results for input(s): GLUCAP in the last 168 hours.  No results found for this or any previous visit (from the past 240 hour(s)).   Studies:              All Imaging reviewed and is as per above notation   Scheduled Meds: . aspirin EC  81 mg Oral Daily  . enoxaparin (LOVENOX) injection  40 mg Subcutaneous Q24H  . furosemide  40 mg Intravenous BID  . lisinopril  5 mg Oral Daily  . metoprolol tartrate  25 mg Oral BID  . nicotine  21 mg Transdermal Q24H  . sodium chloride  3 mL Intravenous Q12H   Continuous Infusions:    Assessment/Plan: 1. Acute diastolic HF--echo poor quality-no overt CP--Wouldn't rpt for wm -Diurese Lasix Iv 40 bid, continue Lisinopril 5 daily-Continue Metoprolol 25 bid--modify risk factors-already -4.7 liters fluid so far--precipitant could also have been NSAIDs--Naprocen usually less risky>other 2. OSA/presumed OHSS-will need sleep eval-being scheduled by CM-Will need OP pulm f/u 3. Demand ty 2 ischemia-no role invasive intervention right now 4. COPD-needs OP PFT's-needs to quit smoking 5. Tob-Continue Nicotine counseling and patch 6. Chr LBP-OP follow  up-continue tramadol on d/c  Code Status: full Family Communication:  None bedside  Disposition Plan: inpatient pending resolution-appreciate Cardiology input   Verneita Griffes, MD  [facility] Pager [phone] 06/18/2014, 3:47 PM    LOS: 1 day

## 2014-06-18 NOTE — Progress Notes (Signed)
  Echocardiogram 2D Echocardiogram has been performed.  Donata Clay 06/18/2014, 1:55 PM

## 2014-06-18 NOTE — Progress Notes (Signed)
Pt and family member counseled on need for strict I/O and cardiac diet when RN noticed soft drink containers and snacks brought from home. Pt and family verbalized understanding.

## 2014-06-18 NOTE — Progress Notes (Signed)
UR Completed Andromeda Poppen Graves-Bigelow, RN,BSN [phone]  

## 2014-06-18 NOTE — Consult Note (Signed)
CARDIOLOGY CONSULT NOTE  Patient ID: Tracy Fitzgerald MRN: [id number] DOB/AGE: 49-Jul-1967 49 y.o.  Admit date: 06/17/2014 Referring Physician:  [facility] Primary Physician:  No primary care provider on file. Reason for Consultation: Shortness of breath, edema  HPI: Tracy Fitzgerald is a 49 year old Caucasian female with past medical history of hypertension and current tobacco use who presented to the emergency department on 06/16/2014 for increasing shortness of breath and lower extremity edema over the past month.  She states over the past 2-3 weeks, she has had increased shortness of breath when laying flat in bed or when doing minimal activities.  When asked specifically, she reports brief, intermittent sharp chest pain that occurs without relation to exertion or time of day, last a few seconds.  She has not experienced this since admission to the hospital.  Since hospital admission, patient also states that her dyspnea has improved significantly. Previous to that she has had episodes of orthopnea and inability to lay down flat along with worsening leg edema. No history of illicit drug use. She has also had increased lower extremity edema over the past 2 weeks.  She has a chronic, nonproductive cough.  She has an approximate 20-30 pack year history of smoking.  She takes NSAIDs daily for chronic low back pain.   Patient has not seen a physician in greater than 3-4 years, has a previous diagnosis of hypertension but discontinued all her medications. She is presently disabled due to chronic back pain and previous work-related back injury as per patient's history. Husband present at the bedside. She is unable to do any activity at home due to back injury and dyspnea on exertion which is chronic but has gotten worse in the past 1 month.  Past Medical History  Diagnosis Date  . Asthma   . Pain disorder   . Hypertension   . CHF (congestive heart failure)   . Depression   . Headache      Past  Surgical History  Procedure Laterality Date  . Tonsillectomy    . Adenoidectomy       History reviewed. No pertinent family history.   Social History: History   Social History  . Marital Status: Married    Spouse Name: N/A  . Number of Children: N/A  . Years of Education: N/A   Occupational History  . Not on file.   Social History Main Topics  . Smoking status: Current Every Day Smoker -- 0.50 packs/day for 33 years    Types: Cigarettes  . Smokeless tobacco: Never Used  . Alcohol Use: Yes     Comment: occasionally  . Drug Use: No  . Sexual Activity: Not on file   Other Topics Concern  . Not on file   Social History Narrative     Prescriptions prior to admission  Medication Sig Dispense Refill Last Dose  . albuterol (PROVENTIL HFA;VENTOLIN HFA) 108 (90 BASE) MCG/ACT inhaler Inhale 2 puffs into the lungs every 4 (four) hours as needed. For shortness of breath    unk at unk  . Aspirin-Salicylamide-Caffeine (BC HEADACHE POWDER PO) Take 1 packet by mouth 4 (four) times daily as needed. For pain    06/17/2014 at Unknown time  . ibuprofen (ADVIL,MOTRIN) 200 MG tablet Take 800 mg by mouth every 6 (six) hours as needed for moderate pain.   06/17/2014 at Unknown time    Scheduled Meds: . aspirin EC  81 mg Oral Daily  . carvedilol  3.125 mg Oral BID WC  . enoxaparin (  LOVENOX) injection  40 mg Subcutaneous Q24H  . furosemide  40 mg Intravenous BID  . lisinopril  5 mg Oral Daily  . nicotine  21 mg Transdermal Daily  . sodium chloride  3 mL Intravenous Q12H   Continuous Infusions:  PRN Meds:.sodium chloride, acetaminophen, ondansetron (ZOFRAN) IV, sodium chloride, traMADol  ROS: General: no fevers/chills/night sweats Eyes: no blurry vision, diplopia, or amaurosis ENT: no sore throat or hearing loss Resp: reports chronic, non-productive cough and wheezing. No hemoptysis CV: reports lower extremity edema, atypical chest pain, and orthopnea. No palpitations GI: no abdominal  pain, nausea, vomiting, diarrhea, or constipation GU: no dysuria, frequency, or hematuria Skin: no rash Neuro: no headache, numbness, tingling, or weakness of extremities Musculoskeletal: reports chronic low back pain Heme: no bleeding, DVT, or easy bruising Endo: no polydipsia or polyuria  Physical Exam: Blood pressure 154/88, pulse 88, temperature 98.7 F (37.1 C), temperature source Oral, resp. rate 18, height 5\' 6"  (1.676 m), weight 147.8 kg (325 lb 13.4 oz), last menstrual period 05/27/2014, SpO2 98 %.   General appearance: alert, cooperative, appears older than stated age, no distress and morbidly obese Neck: JVD - 6 cm above sternal notch and no carotid bruit Lungs: diminished breath sounds bilaterally Chest wall: no tenderness Heart: S1, S2 normal and systolic murmur: systolic ejection 1/6,   at 2nd right intercostal space Abdomen: soft, non-tender; bowel sounds normal; no masses,  no organomegaly Extremities: edema 2-3+ bilateral lower extremities, no calf tenderness. No signs of information. Pulses: difficult to exam due to edema and morbid obesity, 2+ pedal (DP) pulses bilaterally Skin: Skin color, texture, turgor normal. No rashes or lesions  Labs:   Lab Results  Component Value Date   WBC 8.9 06/17/2014   HGB 15.1* 06/17/2014   HCT 46.8* 06/17/2014   MCV 98.5 06/17/2014   PLT 209 06/17/2014    Recent Labs Lab 06/18/14 0330  NA 138  K 4.2  CL 102  CO2 30  BUN 10  CREATININE 0.70  CALCIUM 9.1  GLUCOSE 108*   Lab Results  Component Value Date   TROPONINI 0.06* 06/18/2014    Lipid Panel  No results found for: CHOL, TRIG, HDL, CHOLHDL, VLDL, LDLCALC  EKG 06/17/2014 sinus tachycardia at a rate of 106 bpm, poor R-wave progression, cannot exclude anterior infarct old.   Radiology: Dg Chest 2 View  06/17/2014   CLINICAL DATA:  One month history of mid chest pain and shortness of breath, lower extremity swelling, history of asthmatic bronchitis, current smoker.   EXAM: CHEST  2 VIEW  COMPARISON:  PA and lateral chest of January 19, 2011  FINDINGS: The lungs are hyperinflated with hemidiaphragm flattening. Interstitial markings are coarse bilaterally and have increased. The cardiac silhouette is normal in size. The central pulmonary vascularity is mildly prominent. There is new minimal cephalization of the vascular pattern. The mediastinum is normal in width. There is no pleural effusion or pneumothorax. The bony thorax is unremarkable.  IMPRESSION: COPD with chronic bronchitic changes. However, increased conspicuity of the pulmonary vascularity suggests low-grade CHF. Follow-up radiographs following anticipated antibiotic and possibly diuretic therapy are recommended to reassess the lung parenchyma.   Electronically Signed   By: David  Martinique   On: 06/17/2014 12:40   Dg Lumbar Spine 2-3 Views  06/17/2014   CLINICAL DATA:  [place] 2 months ago. Pain in lower back since the fall.  EXAM: LUMBAR SPINE - 2-3 VIEW  COMPARISON:  None.  FINDINGS: There is no evidence of lumbar  spine fracture. Alignment is normal. There is disc height loss at L4-5. Facet hypertrophy is identified in the lower levels. No suspicious lytic or blastic lesions are identified.  IMPRESSION: 1.  No evidence for acute  abnormality. 2. Mild L4-5 spondylosis.   Electronically Signed   By: Nolon Nations M.D.   On: 06/17/2014 21:56   ASSESSMENT AND PLAN:  1. Shortness of breath due to acute on chronic diastolic heart failure with mildly elevated BNP. Serum troponin leak is flat, do not suspect NSTEMI, suspect secondary from CHF. 2. Edema of the legs bilateral. 3. Atypical chest pain very unlikely to be angina pectoris. 4. HTN 5. Morbid obesity with BMI of 52.7 6. Tobacco use disorder  Recommendation: Shortness of breath likely multifactorial secondary to acute heart failure, COPD, and morbid obesity.  Symptoms have improved with diuresis.  Slight elevation in troponin likely due to demand ischemia  from fluid overload.  Echocardiogram pending. Discontinue carvedilol due to COPD, will use selective beta blocker. I do not think she needs stress testing at this time, we can manage her risk factors aggressively and unless echocardiogram reveals significant abnormalities, specifically LV systolic dysfunction, then we can consider further evaluation. Otherwise continued primary prevention and risk modification is indicated including control of hypertension. Patient appears to be motivated in making lifestyle changes. I have discussed extensively regarding salt restricted diet. Smoking cessation was also discussed.  Continue IV Lasix and lisinopril for now. Thank you for the consultation.  Laverda Page, MD 06/18/2014, 9:14 AM [facility] Pager: [phone] Office: [phone]

## 2014-06-18 NOTE — Progress Notes (Signed)
Patient complained of bilateral lower extremity cramping.  Dr. Verlon Au notified, verbal order recieved for magnesium lab draw.  Order entered into epic.

## 2014-06-19 DIAGNOSIS — M549 Dorsalgia, unspecified: Secondary | ICD-10-CM | POA: Insufficient documentation

## 2014-06-19 DIAGNOSIS — I5031 Acute diastolic (congestive) heart failure: Secondary | ICD-10-CM

## 2014-06-19 LAB — LIPID PANEL
CHOLESTEROL: 164 mg/dL (ref 0–200)
HDL: 50 mg/dL (ref >39)
LDL CALC: 84 mg/dL (ref 0–99)
Total CHOL/HDL Ratio: 3.3 RATIO
Triglycerides: 148 mg/dL (ref <150)
VLDL: 30 mg/dL (ref 0–40)

## 2014-06-19 LAB — BASIC METABOLIC PANEL
Anion gap: 11 (ref 5–15)
BUN: 11 mg/dL (ref 6–23)
CO2: 30 mmol/L (ref 19–32)
Calcium: 9.3 mg/dL (ref 8.4–10.5)
Chloride: 96 mmol/L (ref 96–112)
Creatinine, Ser: 0.71 mg/dL (ref 0.50–1.10)
GFR calc Af Amer: >90 mL/min (ref >90)
GFR calc non Af Amer: >90 mL/min (ref >90)
GLUCOSE: 94 mg/dL (ref 70–99)
Potassium: 4.1 mmol/L (ref 3.5–5.1)
SODIUM: 137 mmol/L (ref 135–145)

## 2014-06-19 LAB — BRAIN NATRIURETIC PEPTIDE: B Natriuretic Peptide: 54.2 pg/mL (ref 0.0–100.0)

## 2014-06-19 MED ORDER — LISINOPRIL 10 MG PO TABS
10.0000 mg | ORAL_TABLET | Freq: Every day | ORAL | Status: DC
  Administered 2014-06-20: 10 mg via ORAL
  Filled 2014-06-19: qty 1

## 2014-06-19 MED ORDER — PNEUMOCOCCAL VAC POLYVALENT 25 MCG/0.5ML IJ INJ
0.5000 mL | INJECTION | INTRAMUSCULAR | Status: AC
  Administered 2014-06-20: 0.5 mL via INTRAMUSCULAR
  Filled 2014-06-19: qty 0.5

## 2014-06-19 MED ORDER — CHLORTHALIDONE 25 MG PO TABS
25.0000 mg | ORAL_TABLET | ORAL | Status: DC
  Administered 2014-06-20: 25 mg via ORAL
  Filled 2014-06-19 (×2): qty 1

## 2014-06-19 NOTE — Progress Notes (Signed)
[facility] PROGRESS NOTE  Tracy Fitzgerald UYQ:[id number] DOB: 12-24-1965 DOA: 06/17/2014 PCP: No primary care provider on file.  Assessment/Plan: #1 acute diastolic heart failure Questionable etiology. Clinical improvement. BNP has trended down. Cardiac enzymes minimally elevated however per cardiology not likely secondary to ACS. Fasting lipid panel with LDL of 84. 2-D echo with a EF of 50-55%. Patient denies any active chest pain. Patient is negative 8.629 L throughout the hospitalization. IV Lasix has been changed to oral chlorthalidone and lisinopril increased to 10 mg daily per cardiology. Continue metoprolol. Cardiology following appreciate input and recommendations.  #2 hypertension Blood pressure improvement on current regimen. Continue metoprolol. Continue diuretics. Lisinopril has been increased to 10 mg daily per cardiology. Outpatient follow-up.  #3 obstructive sleep apnea Patient to be evaluated for sleep study as outpatient.  #4 COPD Stable.  #5 tobacco abuse Tobacco cessation. Continue nicotine patch.  #6 chronic low back pain Continue current pain management. Outpatient follow-up.  #7 elevated troponin Likely secondary to demand ischemia secondary to problem #1. No need for intervention at this time per cardiology. Outpatient follow-up.  #8 prophylaxis Lovenox for DVT prophylaxis.   Code Status: Full Family Communication: Updated patient no family at bedside. Disposition Plan: Hopefully home tomorrow if medically stable.   Consultants:  Cardiology: Dr. Einar Gip  06/18/2014  Procedures:  2-D echo 06/18/2014  X-ray the lumbar spine 06/17/2014  Chest x-ray 06/17/2014  Antibiotics:  None  HPI/Subjective: Patient states shortness of breath has improved. Lower extremity edema has improved.  Objective: Filed Vitals:   06/19/14 0537  BP: 141/81  Pulse: 86  Temp: 98 F (36.7 C)  Resp: 18    Intake/Output Summary (Last 24 hours) at 06/19/14  1042 Last data filed at 06/19/14 0856  Gross per 24 hour  Intake    500 ml  Output   4600 ml  Net  -4100 ml   Filed Weights   06/17/14 1817 06/18/14 0514 06/19/14 0537  Weight: 147.9 kg (326 lb 1 oz) 147.8 kg (325 lb 13.4 oz) 146.648 kg (323 lb 4.8 oz)    Exam:   General:  NAD  Cardiovascular: RRR  Respiratory: Bibasilar crackles.  Abdomen: Soft, nontender, nondistended, positive bowel sounds.  Musculoskeletal: No clubbing cyanosis. 1-2+ bilateral lower extremity edema.  Data Reviewed: Basic Metabolic Panel:  Recent Labs Lab 06/17/14 1155 06/18/14 0330 06/18/14 1817 06/19/14 0321  NA 137 138  --  137  K 4.1 4.2  --  4.1  CL 102 102  --  96  CO2 25 30  --  30  GLUCOSE 108* 108*  --  94  BUN 9 10  --  11  CREATININE 0.69 0.70  --  0.71  CALCIUM 9.0 9.1  --  9.3  MG  --   --  2.0  --    Liver Function Tests: No results for input(s): AST, ALT, ALKPHOS, BILITOT, PROT, ALBUMIN in the last 168 hours. No results for input(s): LIPASE, AMYLASE in the last 168 hours. No results for input(s): AMMONIA in the last 168 hours. CBC:  Recent Labs Lab 06/17/14 1155  WBC 8.9  HGB 15.1*  HCT 46.8*  MCV 98.5  PLT 209   Cardiac Enzymes:  Recent Labs Lab 06/17/14 1715 06/17/14 2325 06/18/14 0330  TROPONINI 0.06* 0.03 0.06*   BNP (last 3 results)  Recent Labs  06/17/14 1156 06/19/14 0321  BNP 212.4* 54.2    ProBNP (last 3 results) No results for input(s): PROBNP in the last 8760 hours.  CBG: No results for input(s): GLUCAP in the last 168 hours.  No results found for this or any previous visit (from the past 240 hour(s)).   Studies: Dg Chest 2 View  06/17/2014   CLINICAL DATA:  One month history of mid chest pain and shortness of breath, lower extremity swelling, history of asthmatic bronchitis, current smoker.  EXAM: CHEST  2 VIEW  COMPARISON:  PA and lateral chest of January 19, 2011  FINDINGS: The lungs are hyperinflated with hemidiaphragm flattening.  Interstitial markings are coarse bilaterally and have increased. The cardiac silhouette is normal in size. The central pulmonary vascularity is mildly prominent. There is new minimal cephalization of the vascular pattern. The mediastinum is normal in width. There is no pleural effusion or pneumothorax. The bony thorax is unremarkable.  IMPRESSION: COPD with chronic bronchitic changes. However, increased conspicuity of the pulmonary vascularity suggests low-grade CHF. Follow-up radiographs following anticipated antibiotic and possibly diuretic therapy are recommended to reassess the lung parenchyma.   Electronically Signed   By: David  Martinique   On: 06/17/2014 12:40   Dg Lumbar Spine 2-3 Views  06/17/2014   CLINICAL DATA:  [place] 2 months ago. Pain in lower back since the fall.  EXAM: LUMBAR SPINE - 2-3 VIEW  COMPARISON:  None.  FINDINGS: There is no evidence of lumbar spine fracture. Alignment is normal. There is disc height loss at L4-5. Facet hypertrophy is identified in the lower levels. No suspicious lytic or blastic lesions are identified.  IMPRESSION: 1.  No evidence for acute  abnormality. 2. Mild L4-5 spondylosis.   Electronically Signed   By: Nolon Nations M.D.   On: 06/17/2014 21:56    Scheduled Meds: . aspirin EC  81 mg Oral Daily  . enoxaparin (LOVENOX) injection  40 mg Subcutaneous Q24H  . furosemide  40 mg Intravenous BID  . lisinopril  5 mg Oral Daily  . metoprolol tartrate  25 mg Oral BID  . nicotine  21 mg Transdermal Q24H  . sodium chloride  3 mL Intravenous Q12H   Continuous Infusions:   Principal Problem:   Acute diastolic CHF (congestive heart failure) Active Problems:   Atypical chest pain   Tobacco abuse   Essential hypertension   Morbid obesity    Time spent: 56 minutes    THOMPSON,DANIEL M.D. [facility] Pager [phone]. If 7PM-7AM, please contact night-coverage at [email or web address], password [facility] 06/19/2014, 10:42 AM  LOS: 2 days

## 2014-06-19 NOTE — Progress Notes (Signed)
Subjective:  Feels much improved and dyspnea and leg edema improved  Objective:  Vital Signs in the last 24 hours: Temp:  [98 F (36.7 C)-98.8 F (37.1 C)] 98.8 F (37.1 C) (04/06 1340) Pulse Rate:  [82-86] 82 (04/06 1340) Resp:  [15-18] 15 (04/06 1340) BP: (122-144)/(66-81) 122/66 mmHg (04/06 1340) SpO2:  [94 %-98 %] 94 % (04/06 1340) Weight:  [146.648 kg (323 lb 4.8 oz)] 146.648 kg (323 lb 4.8 oz) (04/06 0537)  Intake/Output from previous day: 04/05 0701 - 04/06 0700 In: 256 [P.O.:240; I.V.:16] Out: 4800 [Urine:4800]  Physical Exam:   General appearance: alert, cooperative, appears older than stated age, no distress and morbidly obese Eyes: negative findings: lids and lashes normal Neck: no adenopathy, no carotid bruit, no JVD, supple, symmetrical, trachea midline and Short neck Neck: JVP - normal, carotids 2+= without bruits Resp: with occasional scattered ronchi Chest wall: no tenderness Cardio: regular rate and rhythm, S1, S2 normal, no murmur, click, rub or gallop and distant heart sounds GI: soft, non-tender; bowel sounds normal; no masses,  no organomegaly and pannus present. Obese Extremities: extremities normal, atraumatic, no cyanosis or edema and large adipose tissue present    Lab Results: BMP  Recent Labs  06/17/14 1155 06/18/14 0330 06/19/14 0321  NA 137 138 137  K 4.1 4.2 4.1  CL 102 102 96  CO2 25 30 30   GLUCOSE 108* 108* 94  BUN 9 10 11   CREATININE 0.69 0.70 0.71  CALCIUM 9.0 9.1 9.3  GFRNONAA >90 >90 >90  GFRAA >90 >90 >90    CBC  Recent Labs Lab 06/17/14 1155  WBC 8.9  RBC 4.75  HGB 15.1*  HCT 46.8*  PLT 209  MCV 98.5  MCH 31.8  MCHC 32.3  RDW 14.6    HEMOGLOBIN A1C No results found for: HGBA1C, MPG  Cardiac Panel (last 3 results)  Recent Labs  06/17/14 1715 06/17/14 2325 06/18/14 0330  TROPONINI 0.06* 0.03 0.06*    BNP (last 3 results) No results for input(s): PROBNP in the last 8760 hours.  TSH  Recent  Labs  06/17/14 1715  TSH 0.349*   Lipid Panel     Component Value Date/Time   CHOL 164 06/19/2014 0321   TRIG 148 06/19/2014 0321   HDL 50 06/19/2014 0321   CHOLHDL 3.3 06/19/2014 0321   VLDL 30 06/19/2014 0321   LDLCALC 84 06/19/2014 0321     CHOLESTEROL  Recent Labs  06/19/14 0321  CHOL 164    Hepatic Function Panel No results for input(s): PROT, ALBUMIN, AST, ALT, ALKPHOS, BILITOT, BILIDIR, IBILI in the last 8760 hours.  Imaging: Imaging results have been reviewed  Cardiac Studies:  EKG: normal EKG, normal sinus rhythm. Echocardiogram 24/07/8097: Grade 2 diastolic dysfunction, grossly preserved LV systolic function, poor echo window. Diastolic function suggest elevated LVEDP and LA pressure.  Assessment/Plan:  1. Acute diastolic heart failure, now improved. BNP reduced. 2. Hypertension 3. Morbid obesity with obesity hypoventilation 4. Tobacco use disorder  Recommendation: Patient is stable from cardiac standpoint. I have had a long discussion with the patient regarding making lifestyle changes and smoking cessation. Low glycemic diet was discussed extensively along with low-salt diet. Patient appears to be motivated. I have discontinued furosemide and switch to chlorthalidone and increased lisinopril to 10 mg by mouth daily. Continue metoprolol. Potentially discharge home in the morning.   Laverda Page, M.D. 06/19/2014, 2:06 PM [facility] Pager: [phone] Office: [phone] If no answer: [phone]

## 2014-06-20 LAB — BASIC METABOLIC PANEL
ANION GAP: 11 (ref 5–15)
BUN: 10 mg/dL (ref 6–23)
CO2: 25 mmol/L (ref 19–32)
Calcium: 9.3 mg/dL (ref 8.4–10.5)
Chloride: 99 mmol/L (ref 96–112)
Creatinine, Ser: 0.65 mg/dL (ref 0.50–1.10)
GFR calc non Af Amer: >90 mL/min (ref >90)
Glucose, Bld: 96 mg/dL (ref 70–99)
POTASSIUM: 4.4 mmol/L (ref 3.5–5.1)
SODIUM: 135 mmol/L (ref 135–145)

## 2014-06-20 LAB — BRAIN NATRIURETIC PEPTIDE: B Natriuretic Peptide: 37 pg/mL (ref 0.0–100.0)

## 2014-06-20 MED ORDER — METOPROLOL TARTRATE 25 MG PO TABS: 25.0000 mg | ORAL_TABLET | Freq: Two times a day (BID) | ORAL | Status: DC

## 2014-06-20 MED ORDER — NICOTINE 21 MG/24HR TD PT24: 21.0000 mg | MEDICATED_PATCH | TRANSDERMAL | Status: DC

## 2014-06-20 MED ORDER — CHLORTHALIDONE 25 MG PO TABS: 25.0000 mg | ORAL_TABLET | ORAL | Status: DC

## 2014-06-20 MED ORDER — ALBUTEROL SULFATE HFA 108 (90 BASE) MCG/ACT IN AERS: 2.0000 | INHALATION_SPRAY | RESPIRATORY_TRACT | Status: DC | PRN

## 2014-06-20 MED ORDER — TRAMADOL HCL 50 MG PO TABS: 50.0000 mg | ORAL_TABLET | Freq: Four times a day (QID) | ORAL | Status: DC | PRN

## 2014-06-20 MED ORDER — LISINOPRIL 10 MG PO TABS: 10.0000 mg | ORAL_TABLET | Freq: Every day | ORAL | Status: DC

## 2014-06-20 MED ORDER — ASPIRIN 81 MG PO TBEC: 81.0000 mg | DELAYED_RELEASE_TABLET | Freq: Every day | ORAL | Status: DC

## 2014-06-20 NOTE — Discharge Summary (Signed)
Physician Discharge Summary  Tracy Fitzgerald EZM:[id number] DOB: May 09, 1965 DOA: 06/17/2014  PCP: No primary care provider on file.  Admit date: 06/17/2014 Discharge date: 06/20/2014  Time spent: 65 minutes  Recommendations for Outpatient Follow-up:  1. Patient is to follow-up at [facility]. On follow-up patient will need a basic metabolic profile done to follow-up on electrolytes and renal function. 2. Patient is to follow-up with Dr. Einar Gip 1-2 weeks. On follow-up it will be determined as to whether patient needs outpatient ischemic workup.  Discharge Diagnoses:  Principal Problem:   Acute diastolic CHF (congestive heart failure) Active Problems:   Atypical chest pain   Tobacco abuse   Essential hypertension   Morbid obesity   Back pain   Discharge Condition: Stable and improved  Diet recommendation: Heart healthy.  Filed Weights   06/18/14 0514 06/19/14 0537 06/20/14 0458  Weight: 147.8 kg (325 lb 13.4 oz) 146.648 kg (323 lb 4.8 oz) 141.794 kg (312 lb 9.6 oz)    History of present illness:  49 year old female with a history of asthma, hypertension, continued tobacco abuse presented with one-month history of progressive shortness of breath. Over the past 2-3 days prior to admission, her shortness of breath had progressively worsened to the point where she was having much difficulty walking to the bathroom. In addition, the patient had been complaining of symptoms consistent with orthopnea and PND over the past week. She had noted increasing lower extremity edema over the past 2 weeks. She has had some intermittent chest discomfort that occurs randomly without any alleviating or exacerbating factors. The patient denied any fevers, chills, nausea, vomiting, diarrhea. She has had a nonproductive cough. Unfortunately, the patient continued to smoke. She used to smoke 2 packs per day but is down to 1 pack per day at this time as is at this time. The patient stated that she has had  to take ibuprofen and BC powder on a daily basis for her chronic low back pain. She stated that she takes 12-16 ibuprofen daily with 2-4 BC powders daily. She has done this for a number of years secondary to her chronic back pain. The patient stated that she did recently have a mechanical fall approximately 2 months ago. Patient denied any dysuria, hematuria, hematochezia, melena, abdominal pain.  In the emergency department, the patient was given albuterol as well as furosemide 20 mg IV 1. She was afebrile. She was hypertensive with blood pressure 187/92. Oxygen saturation was 96-100% on room air. EKG was sinus rhythm without ST-T wave changes. Chest x-ray showed increased tone or vascularity.   Hospital Course:  #1 acute diastolic heart failure Questionable etiology. Patient was noted to have elevated BNP on admission. Cardiac enzymes minimally elevated however per cardiology not likely secondary to ACS. Cardiology consultation was obtained and he followed the patient throughout the hospitalization. Fasting lipid panel with LDL of 84. 2-D echo with a EF of 50-55%. Patient denied any active chest pain. Patient is negative 9.789 L throughout the hospitalization. IV Lasix was  changed to oral chlorthalidone and lisinopril increased to 10 mg daily per cardiology. Patient was continued on metoprolol. Patient improved clinically discharged home in stable and improved condition and is to follow-up with cardiology as a outpatient.   #2 hypertension On admission patient was maintained on metoprolol. Patient was placed on IV Lasix as well as lisinopril. Patient was seen in consultation by cardiology. As patient's acute CHF exacerbation was diuresed patient's blood pressure improved. IV Lasix was subsequently transitioned to oral  chlorthalidone which patient tolerated. Patient's lisinopril was changed to 10 mg daily. Patient's blood pressure improved and was well controlled on this regimen. Outpatient  follow-up.  #3 obstructive sleep apnea Patient to be evaluated for sleep study as outpatient. Patient was sent for outpatient sleep study.  #4 COPD Stable.  #5 tobacco abuse Tobacco cessation. Patient placed on nicotine patch.  #6 chronic low back pain Continued on pain management, with ultram. Outpatient follow-up.  #7 elevated troponin Likely secondary to demand ischemia secondary to problem #1. No need for intervention at this time per cardiology. Outpatient follow-up.   Procedures:  2-D echo 06/18/2014  X-ray the lumbar spine 06/17/2014  Chest x-ray 06/17/2014    Consultations:  Cardiology: Dr. Einar Gip 06/18/2014  Discharge Exam: Filed Vitals:   06/20/14 0458  BP: 120/53  Pulse: 85  Temp: 98.5 F (36.9 C)  Resp:     General: NAD Cardiovascular: RRR Respiratory: CTAB  Discharge Instructions   Discharge Instructions    Diet - low sodium heart healthy    Complete by:  As directed      Discharge instructions    Complete by:  As directed   Follow up with Dr Einar Gip, cardiology in 2-3 weeks.     Increase activity slowly    Complete by:  As directed           Current Discharge Medication List    START taking these medications   Details  aspirin EC 81 MG EC tablet Take 1 tablet (81 mg total) by mouth daily.    chlorthalidone (HYGROTON) 25 MG tablet Take 1 tablet (25 mg total) by mouth every morning. Qty: 31 tablet, Refills: 0    lisinopril (PRINIVIL,ZESTRIL) 10 MG tablet Take 1 tablet (10 mg total) by mouth daily. Qty: 31 tablet, Refills: 0    metoprolol tartrate (LOPRESSOR) 25 MG tablet Take 1 tablet (25 mg total) by mouth 2 (two) times daily. Qty: 62 tablet, Refills: 0    nicotine (NICODERM CQ - DOSED IN MG/24 HOURS) 21 mg/24hr patch Place 1 patch (21 mg total) onto the skin daily. Qty: 28 patch, Refills: 0    traMADol (ULTRAM) 50 MG tablet Take 1 tablet (50 mg total) by mouth every 6 (six) hours as needed for moderate pain. Qty: 30 tablet,  Refills: 0      CONTINUE these medications which have NOT CHANGED   Details  albuterol (PROVENTIL HFA;VENTOLIN HFA) 108 (90 BASE) MCG/ACT inhaler Inhale 2 puffs into the lungs every 4 (four) hours as needed. For shortness of breath     ibuprofen (ADVIL,MOTRIN) 200 MG tablet Take 800 mg by mouth every 6 (six) hours as needed for moderate pain.      STOP taking these medications     Aspirin-Salicylamide-Caffeine (BC HEADACHE POWDER PO)        No Known Allergies Follow-up Information    Follow up with [facility]     On 06/24/2014.   Why:  @ 9:00 am for Primary Care to be established.    Contact information:   [address] [place] [address] [phone]      Follow up with [facility] On 06/25/2014.   Why:  Sleep Study @ 8:00 pm.    Contact information:   [address] [place] [phone]      Follow up with Rachel Bo, NP On 07/08/2014.   Specialty:  Nurse Practitioner   Why:  10:30 AM. Come 15 minutes prior and bring all medicatinos   Contact information:   [place] [address] [phone]       Follow up with Laverda Page, MD. Schedule an appointment as soon as possible for a visit in 2 weeks.   Specialty:  Cardiology   Contact information:   [address] [place] [address] [phone]        The results of significant diagnostics from this hospitalization (including imaging, microbiology, ancillary and laboratory) are listed below for reference.    Significant Diagnostic Studies: Dg Chest 2 View  06/17/2014   CLINICAL DATA:  One month history of mid chest pain and shortness of breath, lower extremity swelling, history of asthmatic bronchitis, current smoker.  EXAM: CHEST  2 VIEW  COMPARISON:  PA and lateral chest of January 19, 2011  FINDINGS: The lungs are hyperinflated with hemidiaphragm  flattening. Interstitial markings are coarse bilaterally and have increased. The cardiac silhouette is normal in size. The central pulmonary vascularity is mildly prominent. There is new minimal cephalization of the vascular pattern. The mediastinum is normal in width. There is no pleural effusion or pneumothorax. The bony thorax is unremarkable.  IMPRESSION: COPD with chronic bronchitic changes. However, increased conspicuity of the pulmonary vascularity suggests low-grade CHF. Follow-up radiographs following anticipated antibiotic and possibly diuretic therapy are recommended to reassess the lung parenchyma.   Electronically Signed   By: David  Martinique   On: 06/17/2014 12:40   Dg Lumbar Spine 2-3 Views  06/17/2014   CLINICAL DATA:  [place] 2 months ago. Pain in lower back since the fall.  EXAM: LUMBAR SPINE - 2-3 VIEW  COMPARISON:  None.  FINDINGS: There is no evidence of lumbar spine fracture. Alignment is normal. There is disc height loss at L4-5. Facet hypertrophy is identified in the lower levels. No suspicious lytic or blastic lesions are identified.  IMPRESSION: 1.  No evidence for acute  abnormality. 2. Mild L4-5 spondylosis.   Electronically Signed   By: Nolon Nations M.D.   On: 06/17/2014 21:56    Microbiology: No results found for this or any previous visit (from the past 240 hour(s)).   Labs: Basic Metabolic Panel:  Recent Labs Lab 06/17/14 1155 06/18/14 0330 06/18/14 1817 06/19/14 0321 06/20/14 0339  NA 137 138  --  137 135  K 4.1 4.2  --  4.1 4.4  CL 102 102  --  96 99  CO2 25 30  --  30 25  GLUCOSE 108* 108*  --  94 96  BUN 9 10  --  11 10  CREATININE 0.69 0.70  --  0.71 0.65  CALCIUM 9.0 9.1  --  9.3 9.3  MG  --   --  2.0  --   --    Liver Function Tests: No results for input(s): AST, ALT, ALKPHOS, BILITOT, PROT, ALBUMIN in the last 168 hours. No results for input(s): LIPASE, AMYLASE in the last 168 hours. No results for input(s): AMMONIA in the last 168  hours. CBC:  Recent Labs Lab 06/17/14 1155  WBC 8.9  HGB 15.1*  HCT 46.8*  MCV 98.5  PLT 209   Cardiac Enzymes:  Recent Labs Lab 06/17/14 1715 06/17/14 2325 06/18/14 0330  TROPONINI 0.06* 0.03 0.06*   BNP: BNP (last 3 results)  Recent Labs  06/17/14 1156 06/19/14 0321 06/20/14 0339  BNP 212.4* 54.2 37.0    ProBNP (last 3 results) No results  for input(s): PROBNP in the last 8760 hours.  CBG: No results for input(s): GLUCAP in the last 168 hours.     SignedIrine Seal MD [facility] 06/20/2014, 10:58 AM

## 2014-06-20 NOTE — Progress Notes (Signed)
Long discussion with patient about follow up medical care; patient has a post hospitalization apt at the [facility] for June 24, 2014 at 9 am; patient can also get her prescriptions filled there also. Patient stated that she is going to try to quit smoking to save money but she has anxiety issues. Lots of encouragement givenAneta Fitzgerald [phone]

## 2014-06-24 ENCOUNTER — Ambulatory Visit: Payer: Self-pay | Attending: Family Medicine | Admitting: Family Medicine

## 2014-06-24 ENCOUNTER — Encounter: Payer: Self-pay | Admitting: Family Medicine

## 2014-06-24 VITALS — BP 111/75 | HR 97 | Temp 97.8°F | Resp 18 | Ht 66.0 in | Wt 310.0 lb

## 2014-06-24 DIAGNOSIS — M25559 Pain in unspecified hip: Secondary | ICD-10-CM | POA: Insufficient documentation

## 2014-06-24 DIAGNOSIS — I10 Essential (primary) hypertension: Secondary | ICD-10-CM | POA: Insufficient documentation

## 2014-06-24 DIAGNOSIS — M25552 Pain in left hip: Secondary | ICD-10-CM

## 2014-06-24 DIAGNOSIS — Z720 Tobacco use: Secondary | ICD-10-CM | POA: Insufficient documentation

## 2014-06-24 DIAGNOSIS — M545 Low back pain, unspecified: Secondary | ICD-10-CM

## 2014-06-24 DIAGNOSIS — I5031 Acute diastolic (congestive) heart failure: Secondary | ICD-10-CM | POA: Insufficient documentation

## 2014-06-24 LAB — BASIC METABOLIC PANEL
BUN: 22 mg/dL (ref 6–23)
CHLORIDE: 94 meq/L — AB (ref 96–112)
CO2: 29 meq/L (ref 19–32)
CREATININE: 1.39 mg/dL — AB (ref 0.50–1.10)
Calcium: 10.2 mg/dL (ref 8.4–10.5)
Glucose, Bld: 118 mg/dL — ABNORMAL HIGH (ref 70–99)
Potassium: 4.8 mEq/L (ref 3.5–5.3)
Sodium: 134 mEq/L — ABNORMAL LOW (ref 135–145)

## 2014-06-24 MED ORDER — TRAMADOL HCL 50 MG PO TABS: 50.0000 mg | ORAL_TABLET | Freq: Four times a day (QID) | ORAL | Status: DC | PRN

## 2014-06-24 MED ORDER — KETOROLAC TROMETHAMINE 60 MG/2ML IM SOLN
60.0000 mg | Freq: Once | INTRAMUSCULAR | Status: AC
  Administered 2014-06-24: 60 mg via INTRAMUSCULAR

## 2014-06-24 NOTE — Progress Notes (Signed)
Patient hospitalized Monday-Thursday, last week, for congestive heart failure. Patient reports fluid still coming off and breathing has improved. No chest pain and patient feels better.  Patient reports pain in lower back, shoots to left hip, at level 7, described as shooting, "rip your leg off, just make me cry".

## 2014-06-24 NOTE — Progress Notes (Signed)
Subjective:    Patient ID: Tracy Fitzgerald, female    DOB: 12/25/1965, 49 y.o.   MRN: [id number]  HPI  Sri Clegg is here for hospital follow-up after being admitted at [facility] between 4//16 and 06/20/14.   She had presented with progressively worsening shortness of breath and increasing lower extremity edema; she had also endorsed taking a lot of NSAIDs and BC powders for her lower back pain. Her blood pressure was 187/92 at presentation and she had normal oxygen saturation on room air. She had an elevated BNP of 212.4 mildly elevated troponins at 0.06, 0.03, 0.06. But her EKG showed normal sinus rhythm with no ST changes. She received IV Lasix with subsequent improvement in her blood pressure and reduction in BNP;  had a 2-D echo which revealed an EF of 50-55%, chest x-ray showed COPD with chronic bronchitic changes and evidence of low CHF. She was seen by cardiology during her hospital course and once stable was discharged and scheduled for an outpatient follow-up.  Interval history: She reports she has no shortness of breath and has an upcoming appointment with cardiology for a stress test on 07/08/14, she is also scheduled for a sleep study on 06/25/14 to evaluate for sleep apnea as etiology of dyspnea which she presented with on admission. However she complains of severe lower back pain which does not radiate to extremities but then makes her cry when at its worst and Tramadol helps her sleep. She also has left hip pain and pain at both sides feel like a stabbing pain and she almost feels like her joint is about to be ripped off Denies loss of sphincteric functions or weakness in legs..   Review of Systems  General: negative for fever, weight loss, appetite change Eyes: no visual symptoms. ENT: no ear symptoms, no sinus tenderness, no nasal congestion or sore throat. Neck: no pain  Respiratory: no wheezing, shortness of breath, cough Cardiovascular: no chest pain, no dyspnea  on exertion, no pedal edema, no orthopnea. Gastrointestinal: no abdominal pain, no diarrhea, no constipation Genito-Urinary: no urinary frequency, no dysuria, no polyuria. Hematologic: no bruising Endocrine: no cold or heat intolerance Neurological: no headaches, no seizures, no tremors Musculoskeletal: no joint pains, no joint swelling Skin: no pruritus, no rash. Psychological: no depression, no anxiety,       Objective:  Filed Vitals:   06/24/14 0907  BP: 111/75  Pulse: 97  Temp: 97.8 F (36.6 C)  Resp: 18      Physical Exam  Constitutional: obese Eyes: PERRLA HENT: Head is atraumatic, normal sinuses, normal oropharynx, normal appearing tonsils and palate Neck: normal range of motion, no thyromegaly, no JVD cardiovascular: normal rate and rhythm, normal heart sounds, no murmurs, rub or gallop, no pedal edema Respiratory: clear to auscultation bilaterally, no wheezes, no rales, no rhonchi Abdomen: soft, not tender to palpation, normal bowel sounds, no enlarged organs Musculoskeletal: mild tenderness in lumbar region, negative straight leg raise Skin: warm and dry, no lesions. Neurological: alert, oriented x3, cranial nerves I-XII grossly intact Psychological: normal mood.  BNP (last 3 results)  Recent Labs  06/17/14 1156 06/19/14 0321 06/20/14 0339  BNP 212.4* 54.2 37.0    ProBNP (last 3 results) No results for input(s): PROBNP in the last 8760 hours.   EXAM: CHEST 2 VIEW  COMPARISON: PA and lateral chest of January 19, 2011  FINDINGS: The lungs are hyperinflated with hemidiaphragm flattening. Interstitial markings are coarse bilaterally and have increased. The cardiac silhouette is normal  in size. The central pulmonary vascularity is mildly prominent. There is new minimal cephalization of the vascular pattern. The mediastinum is normal in width. There is no pleural effusion or pneumothorax. The bony thorax is unremarkable.  IMPRESSION: COPD with  chronic bronchitic changes. However, increased conspicuity of the pulmonary vascularity suggests low-grade CHF. Follow-up radiographs following anticipated antibiotic and possibly diuretic therapy are recommended to reassess the lung parenchyma.   Electronically Signed  By: David Martinique  On: 06/17/2014 12:40       Assessment & Plan:  49 year old female patient with a recent hospitalization for acute on chronic diastolic congestive heart failure likely precipitated by excessive NSAID use currently doing well but does have some low back pain which is chronic  Acute diastolic CHF: Normal systolic function with EF of 50-55% from 2-D echo Asymptomatic at this time Not on Lasix at this time but remains on a beta blocker and is scheduled to see cardiology.  Hypertension: Controlled on current medication.  Low back pain: She be getting a Toradol shot today and I have advised her to continue tramadol: In the event that symptoms persist she may need further imaging in the form of MRI lumbar spine as her x-ray just revealed spondylosis.. Advised to apply a heating pad.  Hip pain: It is possible that her low back pain could be radiating to the hip. She will need additional imaging in the near future.  Morbid obesity: Educated on cutting portion sizes, increasing physical activity at this could also help reduce the lower back pain she is having.  Tobacco abuse:Smoking cessation support: smoking cessation hotline: 1-800-QUIT-NOW.  Smoking cessation classes are available through [facility]. Call [phone] or visit our website at [email or web address]  Spent 3 counseling on smoking cessations and patient is not ready to quit.

## 2014-06-24 NOTE — Patient Instructions (Addendum)
Heart Failure Heart failure is a condition in which the heart has trouble pumping blood. This means your heart does not pump blood efficiently for your body to work well. In some cases of heart failure, fluid may back up into your lungs or you may have swelling (edema) in your lower legs. Heart failure is usually a long-term (chronic) condition. It is important for you to take good care of yourself and follow your health care provider's treatment plan. CAUSES  Some health conditions can cause heart failure. Those health conditions include:  High blood pressure (hypertension). Hypertension causes the heart muscle to work harder than normal. When pressure in the blood vessels is high, the heart needs to pump (contract) with more force in order to circulate blood throughout the body. High blood pressure eventually causes the heart to become stiff and weak.  Coronary artery disease (CAD). CAD is the buildup of cholesterol and fat (plaque) in the arteries of the heart. The blockage in the arteries deprives the heart muscle of oxygen and blood. This can cause chest pain and may lead to a heart attack. High blood pressure can also contribute to CAD.  Heart attack (myocardial infarction). A heart attack occurs when one or more arteries in the heart become blocked. The loss of oxygen damages the muscle tissue of the heart. When this happens, part of the heart muscle dies. The injured tissue does not contract as well and weakens the heart's ability to pump blood.  Abnormal heart valves. When the heart valves do not open and close properly, it can cause heart failure. This makes the heart muscle pump harder to keep the blood flowing.  Heart muscle disease (cardiomyopathy or myocarditis). Heart muscle disease is damage to the heart muscle from a variety of causes. These can include drug or alcohol abuse, infections, or unknown reasons. These can increase the risk of heart failure.  Lung disease. Lung disease  makes the heart work harder because the lungs do not work properly. This can cause a strain on the heart, leading it to fail.  Diabetes. Diabetes increases the risk of heart failure. High blood sugar contributes to high fat (lipid) levels in the blood. Diabetes can also cause slow damage to tiny blood vessels that carry important nutrients to the heart muscle. When the heart does not get enough oxygen and food, it can cause the heart to become weak and stiff. This leads to a heart that does not contract efficiently.  Other conditions can contribute to heart failure. These include abnormal heart rhythms, thyroid problems, and low blood counts (anemia). Certain unhealthy behaviors can increase the risk of heart failure, including:  Being overweight.  Smoking or chewing tobacco.  Eating foods high in fat and cholesterol.  Abusing illicit drugs or alcohol.  Lacking physical activity. SYMPTOMS  Heart failure symptoms may vary and can be hard to detect. Symptoms may include:  Shortness of breath with activity, such as climbing stairs.  Persistent cough.  Swelling of the feet, ankles, legs, or abdomen.  Unexplained weight gain.  Difficulty breathing when lying flat (orthopnea).  Waking from sleep because of the need to sit up and get more air.  Rapid heartbeat.  Fatigue and loss of energy.  Feeling light-headed, dizzy, or close to fainting.  Loss of appetite.  Nausea.  Increased urination during the night (nocturia). DIAGNOSIS  A diagnosis of heart failure is based on your history, symptoms, physical examination, and diagnostic tests. Diagnostic tests for heart failure may include:  Echocardiography. °· Electrocardiography. °· Chest X-ray. °· Blood tests. °· Exercise stress test. °· Cardiac angiography. °· Radionuclide scans. °TREATMENT  °Treatment is aimed at managing the symptoms of heart failure. Medicines, behavioral changes, or surgical intervention may be necessary to  treat heart failure. °· Medicines to help treat heart failure may include: °¨ Angiotensin-converting enzyme (ACE) inhibitors. This type of medicine blocks the effects of a blood protein called angiotensin-converting enzyme. ACE inhibitors relax (dilate) the blood vessels and help lower blood pressure. °¨ Angiotensin receptor blockers (ARBs). This type of medicine blocks the actions of a blood protein called angiotensin. Angiotensin receptor blockers dilate the blood vessels and help lower blood pressure. °¨ Water pills (diuretics). Diuretics cause the kidneys to remove salt and water from the blood. The extra fluid is removed through urination. This loss of extra fluid lowers the volume of blood the heart pumps. °¨ Beta blockers. These prevent the heart from beating too fast and improve heart muscle strength. °¨ Digitalis. This increases the force of the heartbeat. °· Healthy behavior changes include: °¨ Obtaining and maintaining a healthy weight. °¨ Stopping smoking or chewing tobacco. °¨ Eating heart-healthy foods. °¨ Limiting or avoiding alcohol. °¨ Stopping illicit drug use. °¨ Physical activity as directed by your health care provider. °· Surgical treatment for heart failure may include: °¨ A procedure to open blocked arteries, repair damaged heart valves, or remove damaged heart muscle tissue. °¨ A pacemaker to improve heart muscle function and control certain abnormal heart rhythms. °¨ An internal cardioverter defibrillator to treat certain serious abnormal heart rhythms. °¨ A left ventricular assist device (LVAD) to assist the pumping ability of the heart. °HOME CARE INSTRUCTIONS  °· Take medicines only as directed by your health care provider. Medicines are important in reducing the workload of your heart, slowing the progression of heart failure, and improving your symptoms. °¨ Do not stop taking your medicine unless directed by your health care provider. °¨ Do not skip any dose of medicine. °¨ Refill your  prescriptions before you run out of medicine. Your medicines are needed every day. °· Engage in moderate physical activity if directed by your health care provider. Moderate physical activity can benefit some people. The elderly and people with severe heart failure should consult with a health care provider for physical activity recommendations. °· Eat heart-healthy foods. Food choices should be free of trans fat and low in saturated fat, cholesterol, and salt (sodium). Healthy choices include fresh or frozen fruits and vegetables, fish, lean meats, legumes, fat-free or low-fat dairy products, and whole grain or high fiber foods. Talk to a dietitian to learn more about heart-healthy foods. °· Limit sodium if directed by your health care provider. Sodium restriction may reduce symptoms of heart failure in some people. Talk to a dietitian to learn more about heart-healthy seasonings. °· Use healthy cooking methods. Healthy cooking methods include roasting, grilling, broiling, baking, poaching, steaming, or stir-frying. Talk to a dietitian to learn more about healthy cooking methods. °· Limit fluids if directed by your health care provider. Fluid restriction may reduce symptoms of heart failure in some people. °· Weigh yourself every day. Daily weights are important in the early recognition of excess fluid. You should weigh yourself every morning after you urinate and before you eat breakfast. Wear the same amount of clothing each time you weigh yourself. Record your daily weight. Provide your health care provider with your weight record. °· Monitor and record your blood pressure if directed by your health care   provider.  Check your pulse if directed by your health care provider.  Lose weight if directed by your health care provider. Weight loss may reduce symptoms of heart failure in some people.  Stop smoking or chewing tobacco. Nicotine makes your heart work harder by causing your blood vessels to constrict.  Do not use nicotine gum or patches before talking to your health care provider.  Keep all follow-up visits as directed by your health care provider. This is important.  Limit alcohol intake to no more than 1 drink per day for nonpregnant women and 2 drinks per day for men. One drink equals 12 ounces of beer, 5 ounces of wine, or 1 ounces of hard liquor. Drinking more than that is harmful to your heart. Tell your health care provider if you drink alcohol several times a week. Talk with your health care provider about whether alcohol is safe for you. If your heart has already been damaged by alcohol or you have severe heart failure, drinking alcohol should be stopped completely.  Stop illicit drug use.  Stay up-to-date with immunizations. It is especially important to prevent respiratory infections through current pneumococcal and influenza immunizations.  Manage other health conditions such as hypertension, diabetes, thyroid disease, or abnormal heart rhythms as directed by your health care provider.  Learn to manage stress.  Plan rest periods when fatigued.  Learn strategies to manage high temperatures. If the weather is extremely hot:  Avoid vigorous physical activity.  Use air conditioning or fans or seek a cooler location.  Avoid caffeine and alcohol.  Wear loose-fitting, lightweight, and light-colored clothing.  Learn strategies to manage cold temperatures. If the weather is extremely cold:  Avoid vigorous physical activity.  Layer clothes.  Wear mittens or gloves, a hat, and a scarf when going outside.  Avoid alcohol.  Obtain ongoing education and support as needed.  Participate in or seek rehabilitation as needed to maintain or improve independence and quality of life. SEEK MEDICAL CARE IF:   Your weight increases by 03 lb/1.4 kg in 1 day or 05 lb/2.3 kg in a week.  You have increasing shortness of breath that is unusual for you.  You are unable to participate in  your usual physical activities.  You tire easily.  You cough more than normal, especially with physical activity.  You have any or more swelling in areas such as your hands, feet, ankles, or abdomen.  You are unable to sleep because it is hard to breathe.  You feel like your heart is beating fast (palpitations).  You become dizzy or light-headed upon standing up. SEEK IMMEDIATE MEDICAL CARE IF:   You have difficulty breathing.  There is a change in mental status such as decreased alertness or difficulty with concentration.  You have a pain or discomfort in your chest.  You have an episode of fainting (syncope). MAKE SURE YOU:   Understand these instructions.  Will watch your condition.  Will get help right away if you are not doing well or get worse. Document Released: 03/01/2005 Document Revised: 07/16/2013 Document Reviewed: 03/31/2012 [facility] Patient Information 2015 [place], [place]. This information is not intended to replace advice given to you by your health care provider. Make sure you discuss any questions you have with your health care provider. Back Pain, Adult Low back pain is very common. About 1 in 5 people have back pain.The cause of low back pain is rarely dangerous. The pain often gets better over time.About half of people with a  sudden onset of back pain feel better in just 2 weeks. About 8 in 10 people feel better by 6 weeks.  CAUSES Some common causes of back pain include:  Strain of the muscles or ligaments supporting the spine.  Wear and tear (degeneration) of the spinal discs.  Arthritis.  Direct injury to the back. DIAGNOSIS Most of the time, the direct cause of low back pain is not known.However, back pain can be treated effectively even when the exact cause of the pain is unknown.Answering your caregiver's questions about your overall health and symptoms is one of the most accurate ways to make sure the cause of your pain is not dangerous. If  your caregiver needs more information, he or she may order lab work or imaging tests (X-rays or MRIs).However, even if imaging tests show changes in your back, this usually does not require surgery. HOME CARE INSTRUCTIONS For many people, back pain returns.Since low back pain is rarely dangerous, it is often a condition that people can learn to [facility] their own.   Remain active. It is stressful on the back to sit or stand in one place. Do not sit, drive, or stand in one place for more than 30 minutes at a time. Take short walks on level surfaces as soon as pain allows.Try to increase the length of time you walk each day.  Do not stay in bed.Resting more than 1 or 2 days can delay your recovery.  Do not avoid exercise or work.Your body is made to move.It is not dangerous to be active, even though your back may hurt.Your back will likely heal faster if you return to being active before your pain is gone.  Pay attention to your body when you bend and lift. Many people have less discomfortwhen lifting if they bend their knees, keep the load close to their bodies,and avoid twisting. Often, the most comfortable positions are those that put less stress on your recovering back.  Find a comfortable position to sleep. Use a firm mattress and lie on your side with your knees slightly bent. If you lie on your back, put a pillow under your knees.  Only take over-the-counter or prescription medicines as directed by your caregiver. Over-the-counter medicines to reduce pain and inflammation are often the most helpful.Your caregiver may prescribe muscle relaxant drugs.These medicines help dull your pain so you can more quickly return to your normal activities and healthy exercise.  Put ice on the injured area.  Put ice in a plastic bag.  Place a towel between your skin and the bag.  Leave the ice on for 15-20 minutes, 03-04 times a day for the first 2 to 3 days. After that, ice and heat may be  alternated to reduce pain and spasms.  Ask your caregiver about trying back exercises and gentle massage. This may be of some benefit.  Avoid feeling anxious or stressed.Stress increases muscle tension and can worsen back pain.It is important to recognize when you are anxious or stressed and learn ways to manage it.Exercise is a great option. SEEK MEDICAL CARE IF:  You have pain that is not relieved with rest or medicine.  You have pain that does not improve in 1 week.  You have new symptoms.  You are generally not feeling well. SEEK IMMEDIATE MEDICAL CARE IF:   You have pain that radiates from your back into your legs.  You develop new bowel or bladder control problems.  You have unusual weakness or numbness in your arms  or legs.  You develop nausea or vomiting.  You develop abdominal pain.  You feel faint. Document Released: 03/01/2005 Document Revised: 08/31/2011 Document Reviewed: 07/03/2013 [facility] Patient Information 2015 [place], [place]. This information is not intended to replace advice given to you by your health care provider. Make sure you discuss any questions you have with your health care provider.

## 2014-06-25 ENCOUNTER — Telehealth: Payer: Self-pay

## 2014-06-25 ENCOUNTER — Ambulatory Visit (HOSPITAL_BASED_OUTPATIENT_CLINIC_OR_DEPARTMENT_OTHER): Payer: Self-pay | Attending: Family Medicine | Admitting: Radiology

## 2014-06-25 VITALS — Ht 66.0 in | Wt 305.0 lb

## 2014-06-25 DIAGNOSIS — R0683 Snoring: Secondary | ICD-10-CM | POA: Insufficient documentation

## 2014-06-25 DIAGNOSIS — G4761 Periodic limb movement disorder: Secondary | ICD-10-CM | POA: Insufficient documentation

## 2014-06-25 DIAGNOSIS — G4733 Obstructive sleep apnea (adult) (pediatric): Secondary | ICD-10-CM | POA: Insufficient documentation

## 2014-06-25 NOTE — Telephone Encounter (Signed)
Nurse called patient to give lab results. Phone busy. Nurse will return call at later time.

## 2014-06-25 NOTE — Telephone Encounter (Signed)
-----   Message from Arnoldo Morale, MD sent at 06/25/2014  8:32 AM EDT ----- Please inform her that her creatinine has increased from baseline and this could be due to the Lasix which she received for her CHF and so she would need to have a repeat set of labs when she goes to see her cardiologist.

## 2014-06-27 ENCOUNTER — Telehealth: Payer: Self-pay

## 2014-06-27 NOTE — Telephone Encounter (Signed)
Nurse called patient at number provided, reached an automated answering service for a hotel, [facility]. Can not reach patient at number provided.

## 2014-06-29 DIAGNOSIS — G4733 Obstructive sleep apnea (adult) (pediatric): Secondary | ICD-10-CM

## 2014-06-29 NOTE — Sleep Study (Signed)
   NAME: Tracy Fitzgerald DATE OF BIRTH:  Dec 08, 1965 MEDICAL RECORD NUMBER [id number]  LOCATION: [facility]  PHYSICIAN: YOUNG,CLINTON D  DATE OF STUDY: 06/25/2014  SLEEP STUDY TYPE: Nocturnal Polysomnogram               REFERRING PHYSICIAN: Nita Sells, MD  INDICATION FOR STUDY: Hypersomnia with sleep apnea  EPWORTH SLEEPINESS SCORE:   6/24 HEIGHT: 5\' 6"  (167.6 cm)  WEIGHT: (!) 305 lb (138.347 kg)    Body mass index is 49.25 kg/(m^2).  NECK SIZE: 16 in.  MEDICATIONS: Charted for review  SLEEP ARCHITECTURE: Total sleep time 351 minutes with sleep efficiency 86.3%. Stage I was 6.4%, stage II 65.2%, stage III 1%, REM 27.4% of total sleep time. Sleep latency 8 minutes, REM latency 102.5 minutes, awake after sleep onset 47.5 minutes, arousal index 16.4, bedtime medication: Tramadol  RESPIRATORY DATA: Split protocol study. Apnea hypopnea index (AHI) 19.5 per hour. 40 total events scored including 4 obstructive apneas, 1 mixed apnea, 35 hypopneas. Non-positional events. REM AHI 23.8 per hour. CPAP titration to 14 CWP, AHI 0 per hour. She wore a medium fullface mask.  OXYGEN DATA: Moderately loud snoring before CPAP with oxygen desaturation to a nadir of 68% on room air. Because of persistent desaturation, supplemental oxygen was added to CPAP, 1 L/m at 3:52 AM and 2 L/m after 4:09 AM.  CARDIAC DATA: Sinus rhythm with PVCs  MOVEMENT/PARASOMNIA: 64 limb jerks were counted of which 12 were associated with arousal or awakening for periodic limb movement with arousal index of 2.1 per hour. Bathroom 1  IMPRESSION/ RECOMMENDATION:   1) Moderate obstructive sleep apnea/hypopnea syndrome, AHI 19.5 per hour positional events. Moderately loud snoring with oxygen desaturation to a nadir of 68% on room air. 2) Successful CPAP titration to 14 CWP, AHI 0 per hour. She wore a medium ResMedAirFit F-10 fullface mask with heated humidifier. Snoring was prevented. 3) Room air  oxygen saturation on arrival while awake was 95%. With sleep she desaturated to a nadir of 68%. A total of 108.3 minutes were recorded during this study with room air saturation less than 88%. Because of persistent desaturation, supplemental oxygen was added to CPAP at 2 L/m with final oxygen saturation at 2 L/m while wearing CPAP 90-91%. 4) Mild periodic limb movement with a total of 64 limb jerks associated with 12 arousals for a periodic limb movement with arousal index of 2.1 per hour. This pattern may resolved with adjustment to CPAP in the home environment.    Deneise Lever Diplomate, American Board of Sleep Medicine  ELECTRONICALLY SIGNED ON:  06/29/2014, 10:51 AM [place] PH: [phone]   FX: [phone] Lane

## 2014-07-09 ENCOUNTER — Encounter: Payer: Self-pay | Admitting: Internal Medicine

## 2014-07-09 ENCOUNTER — Ambulatory Visit: Payer: Self-pay | Attending: Internal Medicine | Admitting: Internal Medicine

## 2014-07-09 VITALS — BP 126/87 | HR 96 | Temp 98.0°F | Resp 16 | Ht 66.0 in | Wt 298.8 lb

## 2014-07-09 DIAGNOSIS — Z1231 Encounter for screening mammogram for malignant neoplasm of breast: Secondary | ICD-10-CM

## 2014-07-09 DIAGNOSIS — G894 Chronic pain syndrome: Secondary | ICD-10-CM

## 2014-07-09 DIAGNOSIS — M2550 Pain in unspecified joint: Secondary | ICD-10-CM

## 2014-07-09 DIAGNOSIS — G4733 Obstructive sleep apnea (adult) (pediatric): Secondary | ICD-10-CM

## 2014-07-09 DIAGNOSIS — Z720 Tobacco use: Secondary | ICD-10-CM

## 2014-07-09 DIAGNOSIS — I10 Essential (primary) hypertension: Secondary | ICD-10-CM

## 2014-07-09 DIAGNOSIS — F418 Other specified anxiety disorders: Secondary | ICD-10-CM | POA: Insufficient documentation

## 2014-07-09 DIAGNOSIS — F329 Major depressive disorder, single episode, unspecified: Secondary | ICD-10-CM

## 2014-07-09 DIAGNOSIS — F32A Depression, unspecified: Secondary | ICD-10-CM

## 2014-07-09 DIAGNOSIS — I5031 Acute diastolic (congestive) heart failure: Secondary | ICD-10-CM

## 2014-07-09 DIAGNOSIS — F419 Anxiety disorder, unspecified: Secondary | ICD-10-CM

## 2014-07-09 DIAGNOSIS — J449 Chronic obstructive pulmonary disease, unspecified: Secondary | ICD-10-CM

## 2014-07-09 LAB — COMPLETE METABOLIC PANEL WITH GFR
ALT: 32 U/L (ref 0–35)
AST: 21 U/L (ref 0–37)
Albumin: 3.8 g/dL (ref 3.5–5.2)
Alkaline Phosphatase: 84 U/L (ref 39–117)
BILIRUBIN TOTAL: 0.5 mg/dL (ref 0.2–1.2)
BUN: 14 mg/dL (ref 6–23)
CALCIUM: 9.8 mg/dL (ref 8.4–10.5)
CO2: 24 mEq/L (ref 19–32)
CREATININE: 0.88 mg/dL (ref 0.50–1.10)
Chloride: 97 mEq/L (ref 96–112)
GFR, Est African American: 89 mL/min
GFR, Est Non African American: 77 mL/min
GLUCOSE: 147 mg/dL — AB (ref 70–99)
Potassium: 3.9 mEq/L (ref 3.5–5.3)
Sodium: 135 mEq/L (ref 135–145)
TOTAL PROTEIN: 6.7 g/dL (ref 6.0–8.3)

## 2014-07-09 LAB — URIC ACID: URIC ACID, SERUM: 6.3 mg/dL (ref 2.4–7.0)

## 2014-07-09 LAB — RHEUMATOID FACTOR

## 2014-07-09 NOTE — Progress Notes (Signed)
MRN: [id number] Name: Tracy Fitzgerald  Sex: female Age: 49 y.o. DOB: 02/19/1966  Allergies: Review of patient's allergies indicates no known allergies.  Chief Complaint  Patient presents with  . Back Pain  . Congestive Heart Failure  . COPD    HPI: Patient is 49 y.o. female who Patient has history of hypertension, asthma, tobacco abuse recently hospitalized with shortness of breath, EMR reviewed patient was treated for acute diastolic heart failure was started on IV Lasix, she had elevated BNP and minimally productive phlegm elevation cardiology on board her echo showed EF of 50-55%, her blood pressure medications were optimized including chlorthalidone and lisinopril, she was advised to quit smoking was prescribed nicotine patch, has chronic pain syndrome, patient subsequently followed up with positional care clinic Dr. Jarold Song , she was prescribed tramadol for pain her lower back x-ray reported spondylosis, she's complaining of pain in multiple joints, also requesting referral to see a general health/psychiatrist since she has been having symptoms of anxiety/depression, she also followed up with her cardiologist as per patient she was advised to increase the fluid intake since she was dehydrated. Patient also had sleep study done which reported sleep apnea and patient is to be on CPAP.  Past Medical History  Diagnosis Date  . Asthma   . Pain disorder   . Hypertension   . CHF (congestive heart failure)   . Depression   . Headache     Past Surgical History  Procedure Laterality Date  . Tonsillectomy    . Adenoidectomy        Medication List       This list is accurate as of: 07/09/14 11:18 AM.  Always use your most recent med list.               albuterol 108 (90 BASE) MCG/ACT inhaler  Commonly known as:  PROVENTIL HFA;VENTOLIN HFA  Inhale 2 puffs into the lungs every 4 (four) hours as needed for wheezing or shortness of breath. For shortness of breath     aspirin 81 MG  EC tablet  Take 1 tablet (81 mg total) by mouth daily.     chlorthalidone 25 MG tablet  Commonly known as:  HYGROTON  Take 1 tablet (25 mg total) by mouth every morning.     ibuprofen 200 MG tablet  Commonly known as:  ADVIL,MOTRIN  Take 800 mg by mouth every 6 (six) hours as needed for moderate pain.     lisinopril 10 MG tablet  Commonly known as:  PRINIVIL,ZESTRIL  Take 1 tablet (10 mg total) by mouth daily.     metoprolol tartrate 25 MG tablet  Commonly known as:  LOPRESSOR  Take 1 tablet (25 mg total) by mouth 2 (two) times daily.     nicotine 21 mg/24hr patch  Commonly known as:  NICODERM CQ - dosed in mg/24 hours  Place 1 patch (21 mg total) onto the skin daily.     traMADol 50 MG tablet  Commonly known as:  ULTRAM  Take 1 tablet (50 mg total) by mouth every 6 (six) hours as needed for moderate pain.        No orders of the defined types were placed in this encounter.    Immunization History  Administered Date(s) Administered  . Pneumococcal Polysaccharide-23 06/20/2014    Family History  Problem Relation Age of Onset  . Hypertension Mother   . Diabetes Mother   . Uterine cancer Mother   . Heart disease Mother   .  Cancer Mother     UTERINE CANCER   . Hypertension Father   . Kidney disease Father     History  Substance Use Topics  . Smoking status: Current Every Day Smoker -- 0.25 packs/day for 33 years    Types: Cigarettes  . Smokeless tobacco: Never Used  . Alcohol Use: Yes     Comment: occasionally    Review of Systems   As noted in HPI  Filed Vitals:   07/09/14 0951  BP: 126/87  Pulse: 96  Temp: 98 F (36.7 C)  Resp: 16    Physical Exam  Physical Exam  Constitutional:  Obese female sitting comfortably not in acute distress  Eyes: EOM are normal. Pupils are equal, round, and reactive to light.  Neck: Neck supple.  Cardiovascular: Normal rate and regular rhythm.   Pulmonary/Chest: No respiratory distress. She has no wheezes. She  has no rales.  Musculoskeletal: She exhibits no edema.  Psychiatric:  anxious    CBC    Component Value Date/Time   WBC 8.9 06/17/2014 1155   RBC 4.75 06/17/2014 1155   HGB 15.1* 06/17/2014 1155   HCT 46.8* 06/17/2014 1155   PLT 209 06/17/2014 1155   MCV 98.5 06/17/2014 1155   LYMPHSABS 1.8 07/10/2007 1525   MONOABS 0.9 07/10/2007 1525   EOSABS 0.4 07/10/2007 1525   BASOSABS 0.1 07/10/2007 1525    CMP     Component Value Date/Time   NA 134* 06/24/2014 0945   K 4.8 06/24/2014 0945   CL 94* 06/24/2014 0945   CO2 29 06/24/2014 0945   GLUCOSE 118* 06/24/2014 0945   BUN 22 06/24/2014 0945   CREATININE 1.39* 06/24/2014 0945   CREATININE 0.65 06/20/2014 0339   CALCIUM 10.2 06/24/2014 0945   GFRNONAA >90 06/20/2014 0339   GFRAA >90 06/20/2014 0339    Lab Results  Component Value Date/Time   CHOL 164 06/19/2014 03:21 AM    No results found for: HGBA1C  No results found for: AST  Assessment and Plan  Essential hypertension - Plan: blood pressure is well controlled, advised for DASH diet,, currently patient is on metoprolol, lisinopril, chlorthalidone.COMPLETE METABOLIC PANEL WITH GFR  Tobacco abuse Consultation to quit smoking  Acute diastolic CHF (congestive heart failure) - Plan:currently patient is on aspirin, ACE inhibitor, beta blocker, currently following her with her cardiologist, recheck blood chemistry COMPLETE METABOLIC PANEL WITH GFR  Chronic pain syndrome - Plan: Ambulatory referral to Pain Clinic  Anxiety and depression - Plan: Ambulatory referral to Psychiatry  Chronic obstructive pulmonary disease, unspecified COPD, unspecified chronic bronchitis type Currently stable, she is using albuterol when necessary, reinforced smoking cessation  Morbid obesity Diet and exercise  OSA (obstructive sleep apnea) Patient requires CPAP.  Multiple joint pain - Plan: Rheumatoid factor, ANA, Uric acid, Cyclic citrul peptide antibody, IgG, Ambulatory referral to  Pain Clinic  Encounter for screening mammogram for breast cancer - Plan: [facility] [place] Maintenance  -Pap Smear:will be scheduled  -Mammogram:ordered   Return in about 3 months (around 10/08/2014), or if symptoms worsen or fail to improve, for hypertension, Schedule Appt with Dr Burman Freestone for PAP.   This note has been created with Surveyor, quantity. Any transcriptional errors are unintentional.    Lorayne Marek, MD

## 2014-07-09 NOTE — Patient Instructions (Signed)
Smoking Cessation Quitting smoking is important to your health and has many advantages. However, it is not always easy to quit since nicotine is a very addictive drug. Oftentimes, people try 3 times or more before being able to quit. This document explains the best ways for you to prepare to quit smoking. Quitting takes hard work and a lot of effort, but you can do it. ADVANTAGES OF QUITTING SMOKING  You will live longer, feel better, and live better.  Your body will feel the impact of quitting smoking almost immediately.  Within 20 minutes, blood pressure decreases. Your pulse returns to its normal level.  After 8 hours, carbon monoxide levels in the blood return to normal. Your oxygen level increases.  After 24 hours, the chance of having a heart attack starts to decrease. Your breath, hair, and body stop smelling like smoke.  After 48 hours, damaged nerve endings begin to recover. Your sense of taste and smell improve.  After 72 hours, the body is virtually free of nicotine. Your bronchial tubes relax and breathing becomes easier.  After 2 to 12 weeks, lungs can hold more air. Exercise becomes easier and circulation improves.  The risk of having a heart attack, stroke, cancer, or lung disease is greatly reduced.  After 1 year, the risk of coronary heart disease is cut in half.  After 5 years, the risk of stroke falls to the same as a nonsmoker.  After 10 years, the risk of lung cancer is cut in half and the risk of other cancers decreases significantly.  After 15 years, the risk of coronary heart disease drops, usually to the level of a nonsmoker.  If you are pregnant, quitting smoking will improve your chances of having a healthy baby.  The people you live with, especially any children, will be healthier.  You will have extra money to spend on things other than cigarettes. QUESTIONS TO THINK ABOUT BEFORE ATTEMPTING TO QUIT You may want to talk about your answers with your  health care provider.  Why do you want to quit?  If you tried to quit in the past, what helped and what did not?  What will be the most difficult situations for you after you quit? How will you plan to handle them?  Who can help you through the tough times? Your family? Friends? A health care provider?  What pleasures do you get from smoking? What ways can you still get pleasure if you quit? Here are some questions to ask your health care provider:  How can you help me to be successful at quitting?  What medicine do you think would be best for me and how should I take it?  What should I do if I need more help?  What is smoking withdrawal like? How can I get information on withdrawal? GET READY  Set a quit date.  Change your environment by getting rid of all cigarettes, ashtrays, matches, and lighters in your home, car, or work. Do not let people smoke in your home.  Review your past attempts to quit. Think about what worked and what did not. GET SUPPORT AND ENCOURAGEMENT You have a better chance of being successful if you have help. You can get support in many ways.  Tell your family, friends, and coworkers that you are going to quit and need their support. Ask them not to smoke around you.  Get individual, group, or telephone counseling and support. Programs are available at local hospitals and health centers. Call   your local health department for information about programs in your area.  Spiritual beliefs and practices may help some smokers quit.  Download a "quit meter" on your computer to keep track of quit statistics, such as how long you have gone without smoking, cigarettes not smoked, and money saved.  Get a self-help book about quitting smoking and staying off tobacco. LEARN NEW SKILLS AND BEHAVIORS  Distract yourself from urges to smoke. Talk to someone, go for a walk, or occupy your time with a task.  Change your normal routine. Take a different route to work.  Drink tea instead of coffee. Eat breakfast in a different place.  Reduce your stress. Take a hot bath, exercise, or read a book.  Plan something enjoyable to do every day. Reward yourself for not smoking.  Explore interactive web-based programs that specialize in helping you quit. GET MEDICINE AND USE IT CORRECTLY Medicines can help you stop smoking and decrease the urge to smoke. Combining medicine with the above behavioral methods and support can greatly increase your chances of successfully quitting smoking.  Nicotine replacement therapy helps deliver nicotine to your body without the negative effects and risks of smoking. Nicotine replacement therapy includes nicotine gum, lozenges, inhalers, nasal sprays, and skin patches. Some may be available over-the-counter and others require a prescription.  Antidepressant medicine helps people abstain from smoking, but how this works is unknown. This medicine is available by prescription.  Nicotinic receptor partial agonist medicine simulates the effect of nicotine in your brain. This medicine is available by prescription. Ask your health care provider for advice about which medicines to use and how to use them based on your health history. Your health care provider will tell you what side effects to look out for if you choose to be on a medicine or therapy. Carefully read the information on the package. Do not use any other product containing nicotine while using a nicotine replacement product.  RELAPSE OR DIFFICULT SITUATIONS Most relapses occur within the first 3 months after quitting. Do not be discouraged if you start smoking again. Remember, most people try several times before finally quitting. You may have symptoms of withdrawal because your body is used to nicotine. You may crave cigarettes, be irritable, feel very hungry, cough often, get headaches, or have difficulty concentrating. The withdrawal symptoms are only temporary. They are strongest  when you first quit, but they will go away within 10-14 days. To reduce the chances of relapse, try to:  Avoid drinking alcohol. Drinking lowers your chances of successfully quitting.  Reduce the amount of caffeine you consume. Once you quit smoking, the amount of caffeine in your body increases and can give you symptoms, such as a rapid heartbeat, sweating, and anxiety.  Avoid smokers because they can make you want to smoke.  Do not let weight gain distract you. Many smokers will gain weight when they quit, usually less than 10 pounds. Eat a healthy diet and stay active. You can always lose the weight gained after you quit.  Find ways to improve your mood other than smoking. FOR MORE INFORMATION  www.smokefree.gov  Document Released: 02/23/2001 Document Revised: 07/16/2013 Document Reviewed: 06/10/2011 Exit[employer] Patient Information 2015 [employer]. This information is not intended to replace advice given to you by your health care provider. Make sure you discuss any questions you have with your health care provider.  

## 2014-07-09 NOTE — Progress Notes (Signed)
Patient here for follow up Patient present with multiple issues Has history of chronic pain-hip pain,neck and back pain,rib pain from a recent fall Also suffers from CHF and COPD Patient is concerned with her weight gain and inability to do daily things such as walking Bathing etc. Has some SOB and walks with a cane to prevent falls

## 2014-07-10 ENCOUNTER — Telehealth: Payer: Self-pay

## 2014-07-10 LAB — ANA: ANA: NEGATIVE

## 2014-07-10 LAB — CYCLIC CITRUL PEPTIDE ANTIBODY, IGG

## 2014-07-10 NOTE — Telephone Encounter (Signed)
Patient was called at number and extension [phone] Patient not available Unable to leave message-no machine

## 2014-07-10 NOTE — Telephone Encounter (Signed)
-----   Message from Lorayne Marek, MD sent at 07/10/2014 11:47 AM EDT ----- Blood work reviewed noticed impaired fasting glucose, call and advise patient for low carbohydrate diet.will repeat blood chemistry as well as check hemoglobin A1c on the following visit Her blood test for lupus and rheumatoid arthritis is negative.

## 2014-07-12 ENCOUNTER — Telehealth: Payer: Self-pay

## 2014-07-12 MED ORDER — LISINOPRIL 10 MG PO TABS: 10.0000 mg | ORAL_TABLET | Freq: Every day | ORAL | Status: DC

## 2014-07-12 MED ORDER — METOPROLOL TARTRATE 25 MG PO TABS: 25.0000 mg | ORAL_TABLET | Freq: Two times a day (BID) | ORAL | Status: DC

## 2014-07-12 MED ORDER — TRAMADOL HCL 50 MG PO TABS: 50.0000 mg | ORAL_TABLET | Freq: Four times a day (QID) | ORAL | Status: DC | PRN

## 2014-07-12 MED ORDER — CHLORTHALIDONE 25 MG PO TABS: 25.0000 mg | ORAL_TABLET | ORAL | Status: DC

## 2014-07-12 NOTE — Telephone Encounter (Signed)
Patient called requesting refills on her blood pressure medication and tramadol Blood pressure medications sent to the pharmacy and tramadol is at the front desk

## 2014-07-15 ENCOUNTER — Telehealth: Payer: Self-pay | Admitting: Clinical

## 2014-07-15 NOTE — Telephone Encounter (Signed)
Did not leave message- first attempt at F/U for referral

## 2014-07-16 ENCOUNTER — Other Ambulatory Visit: Payer: Self-pay | Admitting: Family Medicine

## 2014-08-20 ENCOUNTER — Other Ambulatory Visit: Payer: Self-pay | Admitting: Internal Medicine

## 2014-08-23 ENCOUNTER — Other Ambulatory Visit: Payer: Self-pay

## 2014-08-23 MED ORDER — METOPROLOL TARTRATE 25 MG PO TABS: 25.0000 mg | ORAL_TABLET | Freq: Two times a day (BID) | ORAL | Status: DC

## 2014-09-24 ENCOUNTER — Other Ambulatory Visit: Payer: Self-pay | Admitting: Internal Medicine

## 2014-10-25 ENCOUNTER — Encounter: Payer: Self-pay | Admitting: Physical Medicine & Rehabilitation

## 2014-10-25 ENCOUNTER — Telehealth: Payer: Self-pay | Admitting: Internal Medicine

## 2014-10-25 NOTE — Telephone Encounter (Signed)
Patient called requesting medication refill for chlorthalidone (HYGROTON) 25 MG tablet, lisinopril (PRINIVIL,ZESTRIL) 10 MG tablet and metoprolol tartrate (LOPRESSOR) 25 MG tablet. Patient has an appt scheduled for 08/30 but will run out of medication next week. Please f/u

## 2014-10-29 ENCOUNTER — Encounter: Payer: Self-pay | Admitting: Pharmacist

## 2014-10-29 ENCOUNTER — Telehealth: Payer: Self-pay | Admitting: Family Medicine

## 2014-10-29 ENCOUNTER — Other Ambulatory Visit: Payer: Self-pay | Admitting: *Deleted

## 2014-10-29 MED ORDER — METOPROLOL TARTRATE 25 MG PO TABS: 25.0000 mg | ORAL_TABLET | Freq: Two times a day (BID) | ORAL | Status: DC

## 2014-10-29 MED ORDER — CHLORTHALIDONE 25 MG PO TABS: 25.0000 mg | ORAL_TABLET | ORAL | Status: DC

## 2014-10-29 MED ORDER — LISINOPRIL 10 MG PO TABS: 10.0000 mg | ORAL_TABLET | Freq: Every day | ORAL | Status: DC

## 2014-10-29 NOTE — Telephone Encounter (Signed)
Patient called requesting medication refill for chlorthalidone (HYGROTON) 25 MG tablet, lisinopril (PRINIVIL,ZESTRIL) 10 MG tablet and metoprolol tartrate (LOPRESSOR) 25 MG tablet. Patient has an appt scheduled for 08/30 but will run out of medication next week. Please f/u

## 2014-10-30 ENCOUNTER — Telehealth: Payer: Self-pay

## 2014-10-30 NOTE — Telephone Encounter (Signed)
Attempted to call patient to make tham aware of their scheduled appointment With pain management Appointment is scheduled for 12/05/2014 @ 12:45pm and patient  Will be evaluated by Dr Alysia Penna Keys [phone] Patient was not available at this time Message left on voice mail to return our call

## 2014-10-31 NOTE — Telephone Encounter (Signed)
Patient called states pain management has confirmed appointment with patient

## 2014-11-12 ENCOUNTER — Encounter: Payer: Self-pay | Admitting: Family Medicine

## 2014-11-12 ENCOUNTER — Telehealth: Payer: Self-pay | Admitting: Family Medicine

## 2014-11-12 ENCOUNTER — Ambulatory Visit: Payer: Self-pay | Attending: Family Medicine | Admitting: Family Medicine

## 2014-11-12 VITALS — BP 114/76 | HR 95 | Temp 98.5°F | Resp 16 | Ht 66.0 in | Wt 297.0 lb

## 2014-11-12 DIAGNOSIS — R200 Anesthesia of skin: Secondary | ICD-10-CM | POA: Insufficient documentation

## 2014-11-12 DIAGNOSIS — R0602 Shortness of breath: Secondary | ICD-10-CM | POA: Insufficient documentation

## 2014-11-12 DIAGNOSIS — M549 Dorsalgia, unspecified: Secondary | ICD-10-CM

## 2014-11-12 DIAGNOSIS — G4733 Obstructive sleep apnea (adult) (pediatric): Secondary | ICD-10-CM | POA: Insufficient documentation

## 2014-11-12 DIAGNOSIS — R0789 Other chest pain: Secondary | ICD-10-CM | POA: Insufficient documentation

## 2014-11-12 DIAGNOSIS — G8929 Other chronic pain: Secondary | ICD-10-CM | POA: Insufficient documentation

## 2014-11-12 DIAGNOSIS — F17210 Nicotine dependence, cigarettes, uncomplicated: Secondary | ICD-10-CM | POA: Insufficient documentation

## 2014-11-12 DIAGNOSIS — R0609 Other forms of dyspnea: Secondary | ICD-10-CM | POA: Insufficient documentation

## 2014-11-12 DIAGNOSIS — I5030 Unspecified diastolic (congestive) heart failure: Secondary | ICD-10-CM | POA: Insufficient documentation

## 2014-11-12 DIAGNOSIS — R208 Other disturbances of skin sensation: Secondary | ICD-10-CM

## 2014-11-12 DIAGNOSIS — E559 Vitamin D deficiency, unspecified: Secondary | ICD-10-CM | POA: Insufficient documentation

## 2014-11-12 DIAGNOSIS — I5032 Chronic diastolic (congestive) heart failure: Secondary | ICD-10-CM | POA: Insufficient documentation

## 2014-11-12 DIAGNOSIS — M545 Low back pain: Secondary | ICD-10-CM | POA: Insufficient documentation

## 2014-11-12 DIAGNOSIS — I10 Essential (primary) hypertension: Secondary | ICD-10-CM | POA: Insufficient documentation

## 2014-11-12 MED ORDER — DOCUSATE SODIUM 100 MG PO CAPS: 100.0000 mg | ORAL_CAPSULE | Freq: Two times a day (BID) | ORAL | Status: DC

## 2014-11-12 MED ORDER — ALBUTEROL SULFATE HFA 108 (90 BASE) MCG/ACT IN AERS: 2.0000 | INHALATION_SPRAY | RESPIRATORY_TRACT | Status: DC | PRN

## 2014-11-12 MED ORDER — METOPROLOL TARTRATE 25 MG PO TABS: 25.0000 mg | ORAL_TABLET | Freq: Two times a day (BID) | ORAL | Status: DC

## 2014-11-12 MED ORDER — TRAMADOL HCL 50 MG PO TABS: 50.0000 mg | ORAL_TABLET | Freq: Two times a day (BID) | ORAL | Status: DC | PRN

## 2014-11-12 MED ORDER — LISINOPRIL 10 MG PO TABS: 10.0000 mg | ORAL_TABLET | Freq: Every day | ORAL | Status: DC

## 2014-11-12 MED ORDER — IBUPROFEN 800 MG PO TABS: 800.0000 mg | ORAL_TABLET | Freq: Three times a day (TID) | ORAL | Status: DC | PRN

## 2014-11-12 MED ORDER — ASPIRIN 81 MG PO TBEC: 81.0000 mg | DELAYED_RELEASE_TABLET | Freq: Every day | ORAL | Status: DC

## 2014-11-12 MED ORDER — GABAPENTIN 300 MG PO CAPS: 300.0000 mg | ORAL_CAPSULE | Freq: Three times a day (TID) | ORAL | Status: DC

## 2014-11-12 MED ORDER — CHLORTHALIDONE 25 MG PO TABS
25.0000 mg | ORAL_TABLET | ORAL | Status: DC
Start: 2014-11-12 — End: 2015-11-25

## 2014-11-12 NOTE — Patient Instructions (Signed)
Tracy Fitzgerald, Tracy Fitzgerald was seen today for follow-up.  Diagnoses and all orders for this visit:  Chronic diastolic congestive heart failure  Bilateral back pain, unspecified location -     traMADol (ULTRAM) 50 MG tablet; Take 1 tablet (50 mg total) by mouth every 12 (twelve) hours as needed for moderate pain. -     docusate sodium (COLACE) 100 MG capsule; Take 1 capsule (100 mg total) by mouth 2 (two) times daily. -     ibuprofen (ADVIL,MOTRIN) 800 MG tablet; Take 1 tablet (800 mg total) by mouth every 8 (eight) hours as needed for moderate pain.  Essential hypertension -     chlorthalidone (HYGROTON) 25 MG tablet; Take 1 tablet (25 mg total) by mouth every morning. -     lisinopril (PRINIVIL,ZESTRIL) 10 MG tablet; Take 1 tablet (10 mg total) by mouth daily. -     metoprolol tartrate (LOPRESSOR) 25 MG tablet; Take 1 tablet (25 mg total) by mouth 2 (two) times daily.  Shortness of breath -     albuterol (PROVENTIL HFA;VENTOLIN HFA) 108 (90 BASE) MCG/ACT inhaler; Inhale 2 puffs into the lungs every 4 (four) hours as needed for wheezing or shortness of breath. For shortness of breath  Numbness of foot -     Vitamin D, 25-hydroxy -     Vitamin B12 -     gabapentin (NEURONTIN) 300 MG capsule; Take 1 capsule (300 mg total) by mouth 3 (three) times daily.  OSA (obstructive sleep apnea) -     Ambulatory referral to Social Work  Atypical chest pain -     aspirin 81 MG EC tablet; Take 1 tablet (81 mg total) by mouth daily.

## 2014-11-12 NOTE — Telephone Encounter (Signed)
Patient is requesting a disability letter so patient can qualify for [employer] transportation, patient currently is taking taxi service but is not affordable for her. Please f/u

## 2014-11-12 NOTE — Progress Notes (Signed)
Subjective:    Patient ID: Tracy Fitzgerald, female    DOB: 09/21/1965, 49 y.o.   MRN: [id number] CC: chronic back pain. HTN f/u  HPI 49 yo F presents for f/u   1. CHRONIC HYPERTENSION  Disease Monitoring  Blood pressure range: not checking   Chest pain: no   Dyspnea: yes   Claudication: no   Medication compliance: yes  Medication Side Effects  Lightheadedness: no   Urinary frequency: no   Edema: yes     2. DOE: has shortness of breath with exertion. Has OSA. Uninsured and does not have CPAP machine. Has a recent sleep study.  3. Chronic pain: in low back. L hip. With numbness in feet. Awaiting pain management appt. Pain limits mobility.   Social History  Substance Use Topics  . Smoking status: Current Every Day Smoker -- 0.25 packs/day for 33 years    Types: Cigarettes  . Smokeless tobacco: Never Used  . Alcohol Use: Yes     Comment: occasionally   Review of Systems  Constitutional: Negative for fever and chills.  Eyes: Negative for visual disturbance.  Respiratory: Positive for shortness of breath.   Cardiovascular: Negative for chest pain.  Gastrointestinal: Negative for abdominal pain and blood in stool.  Musculoskeletal: Positive for myalgias, back pain and arthralgias.  Skin: Negative for rash.  Allergic/Immunologic: Negative for immunocompromised state.  Neurological: Positive for numbness.  Hematological: Negative for adenopathy. Does not bruise/bleed easily.  Psychiatric/Behavioral: Negative for suicidal ideas and dysphoric mood.  GAD-7: 2. 1-1, 4      Objective:   Physical Exam  Constitutional: She is oriented to person, place, and time. She appears well-developed and well-nourished. No distress.  HENT:  Mouth/Throat: Oropharynx is clear and moist.  Eyes: Conjunctivae are normal. Pupils are equal, round, and reactive to light.  Cardiovascular: Normal rate, regular rhythm, normal heart sounds and intact distal pulses.   Pulmonary/Chest: Effort normal  and breath sounds normal.  Musculoskeletal: She exhibits edema and tenderness (in back and L hip ).  Neurological: She is alert and oriented to person, place, and time.  Skin: Skin is warm and dry. No rash noted.  Psychiatric: She has a normal mood and affect.  BP 114/76 mmHg  Pulse 95  Temp(Src) 98.5 F (36.9 C) (Oral)  Resp 16  Ht 5\' 6"  (1.676 m)  Wt 297 lb (134.718 kg)  BMI 47.96 kg/m2  SpO2 97%  LMP 11/01/2014 Wt Readings from Last 3 Encounters:  11/12/14 297 lb (134.718 kg)  07/09/14 298 lb 12.8 oz (135.535 kg)  06/25/14 305 lb (138.347 kg)       Assessment & Plan:  Tracy Fitzgerald was seen today for follow-up.  Diagnoses and all orders for this visit:  Chronic diastolic congestive heart failure  Bilateral back pain, unspecified location -     Discontinue: traMADol (ULTRAM) 50 MG tablet; Take 1 tablet (50 mg total) by mouth every 12 (twelve) hours as needed for moderate pain. -     docusate sodium (COLACE) 100 MG capsule; Take 1 capsule (100 mg total) by mouth 2 (two) times daily. -     ibuprofen (ADVIL,MOTRIN) 800 MG tablet; Take 1 tablet (800 mg total) by mouth every 8 (eight) hours as needed for moderate pain. -     traMADol (ULTRAM) 50 MG tablet; Take 1 tablet (50 mg total) by mouth every 12 (twelve) hours as needed for moderate pain.  Essential hypertension -     chlorthalidone (HYGROTON) 25 MG tablet; Take 1  tablet (25 mg total) by mouth every morning. -     lisinopril (PRINIVIL,ZESTRIL) 10 MG tablet; Take 1 tablet (10 mg total) by mouth daily. -     metoprolol tartrate (LOPRESSOR) 25 MG tablet; Take 1 tablet (25 mg total) by mouth 2 (two) times daily.  Shortness of breath -     albuterol (PROVENTIL HFA;VENTOLIN HFA) 108 (90 BASE) MCG/ACT inhaler; Inhale 2 puffs into the lungs every 4 (four) hours as needed for wheezing or shortness of breath. For shortness of breath  Numbness of foot -     Vitamin D, 25-hydroxy -     Vitamin B12 -     gabapentin (NEURONTIN) 300 MG  capsule; Take 1 capsule (300 mg total) by mouth 3 (three) times daily.  OSA (obstructive sleep apnea) -     Ambulatory referral to Social Work  Atypical chest pain -     aspirin 81 MG EC tablet; Take 1 tablet (81 mg total) by mouth daily.  Vitamin D insufficiency -     Cholecalciferol (VITAMIN D3) 2000 UNITS TABS; Take 2,000 Units by mouth daily.

## 2014-11-12 NOTE — Progress Notes (Signed)
Establish Care with new pcp F/U HTN Medicine refill, requesting 3 month refill  Pain scale #8 Pain in multiple area, pt has pain management appointment Sep 22 Tobacco user 2 pack per week

## 2014-11-13 ENCOUNTER — Telehealth: Payer: Self-pay | Admitting: Clinical

## 2014-11-13 DIAGNOSIS — E559 Vitamin D deficiency, unspecified: Secondary | ICD-10-CM | POA: Insufficient documentation

## 2014-11-13 LAB — VITAMIN B12: Vitamin B-12: 470 pg/mL (ref 211–911)

## 2014-11-13 LAB — VITAMIN D 25 HYDROXY (VIT D DEFICIENCY, FRACTURES): Vit D, 25-Hydroxy: 22 ng/mL — ABNORMAL LOW (ref 30–100)

## 2014-11-13 MED ORDER — VITAMIN D3 50 MCG (2000 UT) PO TABS: 2000.0000 [IU] | ORAL_TABLET | Freq: Every day | ORAL | Status: DC

## 2014-11-13 NOTE — Telephone Encounter (Signed)
[employer] form B, physician section filled out Patient to fill out form A and B fill out patient sections and turn for into [employer] eligibility office

## 2014-11-13 NOTE — Telephone Encounter (Signed)
Pt notified for at front office ready to be pickup Advised to fill part A and B

## 2014-11-13 NOTE — Telephone Encounter (Signed)
Introduction to [facility] services.

## 2014-11-27 ENCOUNTER — Telehealth: Payer: Self-pay | Admitting: *Deleted

## 2014-11-27 NOTE — Telephone Encounter (Signed)
Date of birth verified by pt Lab results given to pt  Advised pt to get Vit D3 2000 unit per day

## 2014-11-27 NOTE — Telephone Encounter (Signed)
-----   Message from Boykin Nearing, MD sent at 11/13/2014  9:12 AM EDT ----- Vit D insufficiency, take 2000 IU daily  Normal B12

## 2014-12-05 ENCOUNTER — Encounter: Payer: Self-pay | Admitting: Physical Medicine & Rehabilitation

## 2014-12-05 ENCOUNTER — Encounter: Payer: Self-pay | Attending: Physical Medicine & Rehabilitation

## 2014-12-05 ENCOUNTER — Ambulatory Visit (HOSPITAL_BASED_OUTPATIENT_CLINIC_OR_DEPARTMENT_OTHER): Payer: Self-pay | Admitting: Physical Medicine & Rehabilitation

## 2014-12-05 VITALS — BP 132/80 | HR 97

## 2014-12-05 DIAGNOSIS — R200 Anesthesia of skin: Secondary | ICD-10-CM

## 2014-12-05 DIAGNOSIS — Z5181 Encounter for therapeutic drug level monitoring: Secondary | ICD-10-CM

## 2014-12-05 DIAGNOSIS — M25552 Pain in left hip: Secondary | ICD-10-CM | POA: Insufficient documentation

## 2014-12-05 DIAGNOSIS — R208 Other disturbances of skin sensation: Secondary | ICD-10-CM

## 2014-12-05 DIAGNOSIS — Z79899 Other long term (current) drug therapy: Secondary | ICD-10-CM

## 2014-12-05 DIAGNOSIS — G894 Chronic pain syndrome: Secondary | ICD-10-CM

## 2014-12-05 DIAGNOSIS — F17210 Nicotine dependence, cigarettes, uncomplicated: Secondary | ICD-10-CM | POA: Insufficient documentation

## 2014-12-05 DIAGNOSIS — M549 Dorsalgia, unspecified: Secondary | ICD-10-CM

## 2014-12-05 MED ORDER — TRAMADOL HCL 50 MG PO TABS: 50.0000 mg | ORAL_TABLET | Freq: Four times a day (QID) | ORAL | Status: DC | PRN

## 2014-12-05 NOTE — Patient Instructions (Addendum)
Take  3 stool softeners per day Try to drink about 2 quarts of fluid per day Senokot 2 tablets twice a day
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
## 2014-12-18 ENCOUNTER — Telehealth: Payer: Self-pay | Admitting: Internal Medicine

## 2014-12-18 NOTE — Telephone Encounter (Signed)
Patients husband picked up SCAT application, patient gave consent for husband to pick up form.

## 2014-12-30 ENCOUNTER — Ambulatory Visit: Payer: Self-pay | Attending: Family Medicine

## 2015-01-07 ENCOUNTER — Ambulatory Visit: Payer: Self-pay | Admitting: Physical Medicine & Rehabilitation

## 2015-01-17 ENCOUNTER — Encounter: Payer: No Typology Code available for payment source | Attending: Physical Medicine & Rehabilitation

## 2015-01-17 ENCOUNTER — Ambulatory Visit (HOSPITAL_COMMUNITY)
Admission: RE | Admit: 2015-01-17 | Discharge: 2015-01-17 | Disposition: A | Discharge status: Home / Self Care | Payer: No Typology Code available for payment source | Source: Ambulatory Visit | Attending: Physical Medicine & Rehabilitation | Admitting: Physical Medicine & Rehabilitation

## 2015-01-17 ENCOUNTER — Encounter: Payer: Self-pay | Admitting: Physical Medicine & Rehabilitation

## 2015-01-17 ENCOUNTER — Ambulatory Visit (HOSPITAL_BASED_OUTPATIENT_CLINIC_OR_DEPARTMENT_OTHER): Payer: No Typology Code available for payment source | Admitting: Physical Medicine & Rehabilitation

## 2015-01-17 VITALS — BP 113/61 | HR 88 | Resp 14

## 2015-01-17 DIAGNOSIS — M25552 Pain in left hip: Secondary | ICD-10-CM | POA: Insufficient documentation

## 2015-01-17 DIAGNOSIS — M549 Dorsalgia, unspecified: Secondary | ICD-10-CM | POA: Insufficient documentation

## 2015-01-17 DIAGNOSIS — R208 Other disturbances of skin sensation: Secondary | ICD-10-CM | POA: Insufficient documentation

## 2015-01-17 DIAGNOSIS — G894 Chronic pain syndrome: Secondary | ICD-10-CM | POA: Insufficient documentation

## 2015-01-17 DIAGNOSIS — Z5181 Encounter for therapeutic drug level monitoring: Secondary | ICD-10-CM | POA: Insufficient documentation

## 2015-01-17 DIAGNOSIS — Z79899 Other long term (current) drug therapy: Secondary | ICD-10-CM | POA: Insufficient documentation

## 2015-01-17 DIAGNOSIS — F17210 Nicotine dependence, cigarettes, uncomplicated: Secondary | ICD-10-CM | POA: Insufficient documentation

## 2015-01-17 NOTE — Progress Notes (Signed)
Aspiration/Injection Procedure Note Tracy Fitzgerald [id number] [birth year]  Procedure: Injection Indications: Left hip osteoarthritis with severe pain  Procedure Details Consent: Risks of procedure as well as the alternatives and risks of each were explained to the (patient/caregiver).  Consent for procedure obtained. Time Out: Verified patient identification, verified procedure, site/side was marked, verified correct patient position, special equipment/implants available, medications/allergies/relevent history reviewed, required imaging and test results available.  Performed   Local Anesthesia Used:Lidocaine 1% plain; 7mL Amount of Fluid Aspirated: minimal amount Character of Fluid: clear Fluid was sent for:N/A  Celestone 6 mg per mL 1.5 mL +4 mL 1% lidocaine A sterile dressing was applied.  Patient did tolerate procedure well. Estimated blood loss: 0  Tracy Fitzgerald 01/17/2015, 2:27 PM

## 2015-01-17 NOTE — Patient Instructions (Signed)
Left hip x-ray shows some mild osteoarthritis. We'll see how you do with the injection. If not particularly helpful would do back injections next time

## 2015-01-17 NOTE — Progress Notes (Signed)
  PROCEDURE RECORD [facility]   Name: Tracy Fitzgerald DOB:29-Jul-1965 MRN: [id number]  Date:01/17/2015  Physician: Alysia Penna, MD    Nurse/CMA: Mancel Parsons  Allergies: No Known Allergies  Consent Signed: Yes.    Is patient diabetic? No.  CBG today?   Pregnant: No. LMP: Patient's last menstrual period was 12/08/2014 (approximate). (age 50-55)  Anticoagulants: yes (aspirin 325 mg, 12:45 pm) Anti-inflammatory: yes (advil 200 mg 9:00am) Antibiotics: no  Procedure: intra articular hip injection under fluoro Position: Supine Start Time: 2:00pm      End Time: 2:12pm  Fluoro Time: 44  RN/CMA Rolan Bucco Quantisha Marsicano    Time 1:40pm @:15pm    BP 113/61 128/69    Pulse 88 97    Respirations 14 14    O2 Sat 96 98    S/S 6 6    Pain Level 8/10 0/10     D/C home with cab, [name] & O X 3, D/C instructions reviewed, and sits independently.

## 2015-02-14 ENCOUNTER — Telehealth: Payer: Self-pay | Admitting: Clinical

## 2015-02-14 NOTE — Telephone Encounter (Signed)
Left HIPPA-compliant message to contact Jamie at [facility] at [phone]. Second attempt to make appointment for CPAP machine pickup and education.

## 2015-02-18 ENCOUNTER — Encounter: Payer: No Typology Code available for payment source | Attending: Physical Medicine & Rehabilitation

## 2015-02-18 ENCOUNTER — Encounter: Payer: Self-pay | Admitting: Physical Medicine & Rehabilitation

## 2015-02-18 ENCOUNTER — Telehealth: Payer: Self-pay | Admitting: Clinical

## 2015-02-18 ENCOUNTER — Ambulatory Visit (HOSPITAL_BASED_OUTPATIENT_CLINIC_OR_DEPARTMENT_OTHER): Payer: No Typology Code available for payment source | Admitting: Physical Medicine & Rehabilitation

## 2015-02-18 VITALS — BP 126/75 | HR 97 | Resp 14

## 2015-02-18 DIAGNOSIS — F17210 Nicotine dependence, cigarettes, uncomplicated: Secondary | ICD-10-CM | POA: Insufficient documentation

## 2015-02-18 DIAGNOSIS — M47816 Spondylosis without myelopathy or radiculopathy, lumbar region: Secondary | ICD-10-CM

## 2015-02-18 DIAGNOSIS — M25552 Pain in left hip: Secondary | ICD-10-CM | POA: Insufficient documentation

## 2015-02-18 DIAGNOSIS — R208 Other disturbances of skin sensation: Secondary | ICD-10-CM | POA: Insufficient documentation

## 2015-02-18 DIAGNOSIS — M549 Dorsalgia, unspecified: Secondary | ICD-10-CM | POA: Insufficient documentation

## 2015-02-18 DIAGNOSIS — G894 Chronic pain syndrome: Secondary | ICD-10-CM | POA: Insufficient documentation

## 2015-02-18 DIAGNOSIS — Z79899 Other long term (current) drug therapy: Secondary | ICD-10-CM | POA: Insufficient documentation

## 2015-02-18 DIAGNOSIS — Z5181 Encounter for therapeutic drug level monitoring: Secondary | ICD-10-CM | POA: Insufficient documentation

## 2015-02-18 NOTE — Progress Notes (Signed)
  PROCEDURE RECORD [facility]   Name: Tracy Fitzgerald DOB:September 23, 1965 MRN: [id number]  Date:02/18/2015  Physician: Alysia Penna, MD    Nurse/CMA: Mancel Parsons  Allergies: No Known Allergies  Consent Signed: No.  Is patient diabetic? No.  CBG today? na  Pregnant: No. LMP: No LMP recorded. (age 49-55)  Anticoagulants: no Anti-inflammatory: yes (advil, 12:00pm) Antibiotics: no  Procedure: medial branch block   Position: Prone Start Time: 2:54pm  End Time: 3:06 pm  Fluoro Time: 1:05  RN/CMA Rolan Bucco Daneli Butkiewicz    Time 2:30pm 3:12pm    BP 126/75 138/65    Pulse 97 106    Respirations 14 14    O2 Sat 95 97    S/S 6 6    Pain Level 5/10 3/10     D/C home with husband, [name] & O X 3, D/C instructions reviewed, and sits independently.

## 2015-02-18 NOTE — Patient Instructions (Signed)

## 2015-02-18 NOTE — Progress Notes (Signed)
Bilateral Lumbar L3, L4  medial branch blocks and L 5 dorsal ramus injection under fluoroscopic guidance  Indication: Lumbar pain which is not relieved by medication management or other conservative care and interfering with self-care and mobility.  Informed consent was obtained after describing risks and benefits of the procedure with the patient, this includes bleeding, infection, paralysis and medication side effects.  The patient wishes to proceed and has given written consent.  The patient was placed in prone position.  The lumbar area was marked and prepped with Betadine.  One mL of 1% lidocaine was injected into each of 6 areas into the skin and subcutaneous tissue.  Then a 22-gauge 5in spinal needle was inserted targeting the junction of the left S1 superior articular process and sacral ala junction. Needle was advanced under fluoroscopic guidance.  Bone contact was made.  Omnipaque 180 was injected x 0.5 mL demonstrating no intravascular uptake.  Then a solution containing one mL of 4 mg per mL dexamethasone and 3 mL of 2% MPF lidocaine was injected x 0.5 mL.  Then the left L5 superior articular process in transverse process junction was targeted.  Bone contact was made.  Omnipaque 180 was injected x 0.5 mL demonstrating no intravascular uptake. Then a solution containing one mL of 4 mg per mL dexamethasone and 3 mL of 2% MPF lidocaine was injected x 0.5 mL.  Then the left L4 superior articular process in transverse process junction was targeted.  Bone contact was made.  Omnipaque 180 was injected x 0.5 mL demonstrating no intravascular uptake.  Then a solution containing one mL of 4 mg per mL dexamethasone and 3 mL if 2% MPF lidocaine was injected x 0.5 mL.  This same procedure was performed on the right side using the same needle, technique and injectate.  Patient tolerated procedure well.  Post procedure instructions were given. 

## 2015-02-18 NOTE — Telephone Encounter (Signed)
Left HIPPA-compliant message to contact Roselyn Reef from [facility] at [phone], attempt to contact patient about obtaining CPAP machine.

## 2015-02-26 ENCOUNTER — Telehealth: Payer: Self-pay | Admitting: Clinical

## 2015-02-26 NOTE — Telephone Encounter (Signed)
Left HIPPA-compliant to return call to Roselyn Reef at [facility] at [phone], third attempt to set up appointment for Ms. Siglin to pick up CPAP machine.

## 2015-03-20 MED FILL — ?LISINOPRIL 10 MG TABLET: 10 | 30 days supply | Qty: 30 | Fill #4

## 2015-03-20 MED FILL — VENTOLIN HFA 90 MCG INHALER: 108 (90 BAS | 25 days supply | Qty: 18 | Fill #2

## 2015-03-20 MED FILL — ?CHLORTHALIDONE 25 MG TABLE: 25 | 30 days supply | Qty: 30 | Fill #4

## 2015-03-20 MED FILL — METOPROLOL TARTRATE 25 MG T: 25 | 30 days supply | Qty: 60 | Fill #4

## 2015-03-28 ENCOUNTER — Encounter: Payer: Self-pay | Admitting: Physical Medicine & Rehabilitation

## 2015-03-28 ENCOUNTER — Ambulatory Visit (HOSPITAL_BASED_OUTPATIENT_CLINIC_OR_DEPARTMENT_OTHER): Payer: No Typology Code available for payment source | Admitting: Physical Medicine & Rehabilitation

## 2015-03-28 ENCOUNTER — Encounter: Payer: No Typology Code available for payment source | Attending: Physical Medicine & Rehabilitation

## 2015-03-28 VITALS — BP 136/59 | HR 90 | Resp 14

## 2015-03-28 DIAGNOSIS — F17210 Nicotine dependence, cigarettes, uncomplicated: Secondary | ICD-10-CM | POA: Insufficient documentation

## 2015-03-28 DIAGNOSIS — M1612 Unilateral primary osteoarthritis, left hip: Secondary | ICD-10-CM

## 2015-03-28 DIAGNOSIS — G894 Chronic pain syndrome: Secondary | ICD-10-CM | POA: Insufficient documentation

## 2015-03-28 DIAGNOSIS — R200 Anesthesia of skin: Secondary | ICD-10-CM

## 2015-03-28 DIAGNOSIS — M549 Dorsalgia, unspecified: Secondary | ICD-10-CM | POA: Insufficient documentation

## 2015-03-28 DIAGNOSIS — R208 Other disturbances of skin sensation: Secondary | ICD-10-CM | POA: Insufficient documentation

## 2015-03-28 DIAGNOSIS — Z79899 Other long term (current) drug therapy: Secondary | ICD-10-CM | POA: Insufficient documentation

## 2015-03-28 DIAGNOSIS — M25552 Pain in left hip: Secondary | ICD-10-CM | POA: Insufficient documentation

## 2015-03-28 DIAGNOSIS — Z5181 Encounter for therapeutic drug level monitoring: Secondary | ICD-10-CM | POA: Insufficient documentation

## 2015-03-28 MED ORDER — TRAMADOL HCL 50 MG PO TABS: 50.0000 mg | ORAL_TABLET | Freq: Four times a day (QID) | ORAL | Status: DC | PRN

## 2015-03-28 MED ORDER — GABAPENTIN 300 MG PO CAPS: 300.0000 mg | ORAL_CAPSULE | Freq: Three times a day (TID) | ORAL | Status: DC

## 2015-03-28 MED FILL — GABAPENTIN 300 MG CAPSULE: 300 | 30 days supply | Qty: 90 | Fill #0

## 2015-03-28 NOTE — Progress Notes (Signed)
  PROCEDURE RECORD [facility]   Name: Tracy Fitzgerald DOB:03/03/1966 MRN: [id number]  Date:03/28/2015  Physician: Alysia Penna, MD    Nurse/CMA: Gara Kroner  Allergies: No Known Allergies  Consent Signed: Yes.    Is patient diabetic? No.  CBG today? NA  Pregnant: No. LMP: Patient's last menstrual period was 03/10/2015. (age 50-55)  Anticoagulants: no Anti-inflammatory: no Antibiotics: no  Procedure: Intra Articular Hip Injection Under FluoroPosition: Prone Start Time: 2:24pm End Time: 2:31pm Fluoro Time: 36  RN/CMA Amber [facility]	[place]    Time 1:50pm 2:38pm    BP 136/59 139/80    Pulse 90 87    Respirations 14 14    O2 Sat 97 100    S/S 6 6    Pain Level 10/10 10/10     D/C home with SCAT, [name] & O X 3, D/C instructions reviewed, and sits independently.

## 2015-03-28 NOTE — Patient Instructions (Addendum)
Intra-articular hip injection. This can repeated in 3 months as needed  Will check MRI of the lumbar spine to see if there is any other area that can be injected

## 2015-03-28 NOTE — Progress Notes (Signed)
Aspiration/Injection Procedure Note Tracy Fitzgerald [id number] [birth year]  Procedure: Injection Indications: Left hip osteoarthritis with severe pain  Procedure Details Consent: Risks of procedure as well as the alternatives and risks of each were explained to the (patient/caregiver).  Consent for procedure obtained. Time Out: Verified patient identification, verified procedure, site/side was marked, verified correct patient position, special equipment/implants available, medications/allergies/relevent history reviewed, required imaging and test results available.  Performed   Local Anesthesia Used:Lidocaine 1% plain; 11mL Amount of Fluid Aspirated: minimal amount Character of Fluid: clear Fluid was sent for:N/A  Celestone 6 mg per mL 1.5 mL +4 mL 1% lidocaine A sterile dressing was applied.  Patient did tolerate procedure well. Estimated blood loss: 0  Tracy Fitzgerald E 03/28/2015, 2:35 PM  Addendum discussed her low back pain. She had no relief with medial branch blocks. As per my initial evaluation recommend MRI to further delineate pathology. Has had chronic pain which is rated as severe. She has difficulty laying flat during the procedure. No radicular signs unless some of her left hip pain is actually back related

## 2015-04-28 ENCOUNTER — Encounter: Payer: No Typology Code available for payment source | Attending: Physical Medicine & Rehabilitation

## 2015-04-28 ENCOUNTER — Encounter: Payer: Self-pay | Admitting: Physical Medicine & Rehabilitation

## 2015-04-28 ENCOUNTER — Ambulatory Visit (HOSPITAL_BASED_OUTPATIENT_CLINIC_OR_DEPARTMENT_OTHER): Payer: No Typology Code available for payment source | Admitting: Physical Medicine & Rehabilitation

## 2015-04-28 VITALS — BP 139/74 | HR 97 | Resp 14

## 2015-04-28 DIAGNOSIS — G894 Chronic pain syndrome: Secondary | ICD-10-CM | POA: Insufficient documentation

## 2015-04-28 DIAGNOSIS — M549 Dorsalgia, unspecified: Secondary | ICD-10-CM | POA: Insufficient documentation

## 2015-04-28 DIAGNOSIS — M25552 Pain in left hip: Secondary | ICD-10-CM | POA: Insufficient documentation

## 2015-04-28 DIAGNOSIS — F17210 Nicotine dependence, cigarettes, uncomplicated: Secondary | ICD-10-CM | POA: Insufficient documentation

## 2015-04-28 DIAGNOSIS — M533 Sacrococcygeal disorders, not elsewhere classified: Secondary | ICD-10-CM

## 2015-04-28 DIAGNOSIS — R208 Other disturbances of skin sensation: Secondary | ICD-10-CM | POA: Insufficient documentation

## 2015-04-28 DIAGNOSIS — Z79899 Other long term (current) drug therapy: Secondary | ICD-10-CM | POA: Insufficient documentation

## 2015-04-28 DIAGNOSIS — Z5181 Encounter for therapeutic drug level monitoring: Secondary | ICD-10-CM | POA: Insufficient documentation

## 2015-04-28 DIAGNOSIS — G8929 Other chronic pain: Secondary | ICD-10-CM

## 2015-04-28 NOTE — Progress Notes (Signed)

## 2015-04-28 NOTE — Progress Notes (Signed)
  PROCEDURE RECORD [facility]   Name: Tracy Fitzgerald DOB:1965/06/07 MRN: [id number]  Date:04/28/2015  Physician: Alysia Penna, MD    Nurse/CMA: Mancel Parsons  Allergies: No Known Allergies  Consent Signed: Yes.    Is patient diabetic? No.  CBG today? N/A Pregnant: No. LMP: No LMP recorded. (age 50-55)  Anticoagulants: no Anti-inflammatory: no Antibiotics: no  Procedure: left sacroiliac steroid injection Position: Prone Start Time: 2:57pm  End Time: 3:01 pm  Fluoro Time: 17   RN/CMA Rolan Bucco Kirstein Baxley    Time 2:40 pm 3:05pm    BP 139/74 146/71    Pulse 97 97    Respirations 14 14    O2 Sat 97 96    S/S 6 6    Pain Level 7/10      D/C home with SCAT, [name] & O X 3, D/C instructions reviewed, and sits independently.

## 2015-04-28 NOTE — Patient Instructions (Signed)
Sacroiliac injection was performed today. A combination of a naming medicine plus a cortisone medicine was injected. The injection was done under x-ray guidance. This procedure has been performed to help reduce low back and buttocks pain as well as potentially hip pain. The duration of this injection is variable lasting from hours to  Months. It may repeated if needed. 

## 2015-05-01 MED FILL — VENTOLIN HFA 90 MCG INHALER: 108 (90 BAS | 25 days supply | Qty: 18 | Fill #3

## 2015-05-01 MED FILL — ?LISINOPRIL 10 MG TABLET: 10 | 30 days supply | Qty: 30 | Fill #5

## 2015-05-01 MED FILL — ?CHLORTHALIDONE 25 MG TABLE: 25 | 30 days supply | Qty: 30 | Fill #5

## 2015-05-01 MED FILL — METOPROLOL TARTRATE 25 MG T: 25 | 30 days supply | Qty: 60 | Fill #5

## 2015-05-01 MED FILL — GABAPENTIN 300 MG CAPSULE: 300 | 30 days supply | Qty: 90 | Fill #1

## 2015-05-23 ENCOUNTER — Telehealth: Payer: Self-pay | Admitting: Clinical

## 2015-05-23 NOTE — Telephone Encounter (Signed)
Tracy Fitzgerald agrees to come in to [facility] to pick up CPAP machine on 06-10-15 at 10am, followed by [facility] visit at 10:30am.

## 2015-05-26 ENCOUNTER — Ambulatory Visit: Payer: No Typology Code available for payment source | Admitting: Physical Medicine & Rehabilitation

## 2015-05-28 ENCOUNTER — Ambulatory Visit (HOSPITAL_COMMUNITY)
Admission: RE | Admit: 2015-05-28 | Discharge: 2015-05-28 | Disposition: A | Discharge status: Home / Self Care | Payer: Self-pay | Source: Ambulatory Visit | Attending: Physical Medicine & Rehabilitation | Admitting: Physical Medicine & Rehabilitation

## 2015-05-28 DIAGNOSIS — M549 Dorsalgia, unspecified: Secondary | ICD-10-CM | POA: Insufficient documentation

## 2015-05-28 DIAGNOSIS — M5125 Other intervertebral disc displacement, thoracolumbar region: Secondary | ICD-10-CM | POA: Insufficient documentation

## 2015-05-28 DIAGNOSIS — M4690 Unspecified inflammatory spondylopathy, site unspecified: Secondary | ICD-10-CM | POA: Insufficient documentation

## 2015-05-28 DIAGNOSIS — M5127 Other intervertebral disc displacement, lumbosacral region: Secondary | ICD-10-CM | POA: Insufficient documentation

## 2015-05-29 ENCOUNTER — Encounter: Payer: No Typology Code available for payment source | Attending: Physical Medicine & Rehabilitation

## 2015-05-29 ENCOUNTER — Encounter: Payer: Self-pay | Admitting: Physical Medicine & Rehabilitation

## 2015-05-29 ENCOUNTER — Ambulatory Visit (HOSPITAL_BASED_OUTPATIENT_CLINIC_OR_DEPARTMENT_OTHER): Payer: No Typology Code available for payment source | Admitting: Physical Medicine & Rehabilitation

## 2015-05-29 VITALS — BP 115/62 | HR 103

## 2015-05-29 DIAGNOSIS — R208 Other disturbances of skin sensation: Secondary | ICD-10-CM

## 2015-05-29 DIAGNOSIS — Z79899 Other long term (current) drug therapy: Secondary | ICD-10-CM

## 2015-05-29 DIAGNOSIS — M25552 Pain in left hip: Secondary | ICD-10-CM | POA: Insufficient documentation

## 2015-05-29 DIAGNOSIS — M1612 Unilateral primary osteoarthritis, left hip: Secondary | ICD-10-CM

## 2015-05-29 DIAGNOSIS — Z5181 Encounter for therapeutic drug level monitoring: Secondary | ICD-10-CM

## 2015-05-29 DIAGNOSIS — M4726 Other spondylosis with radiculopathy, lumbar region: Secondary | ICD-10-CM

## 2015-05-29 DIAGNOSIS — G894 Chronic pain syndrome: Secondary | ICD-10-CM | POA: Insufficient documentation

## 2015-05-29 DIAGNOSIS — M549 Dorsalgia, unspecified: Secondary | ICD-10-CM | POA: Insufficient documentation

## 2015-05-29 DIAGNOSIS — R200 Anesthesia of skin: Secondary | ICD-10-CM

## 2015-05-29 DIAGNOSIS — F17210 Nicotine dependence, cigarettes, uncomplicated: Secondary | ICD-10-CM | POA: Insufficient documentation

## 2015-05-29 MED ORDER — GABAPENTIN 300 MG PO CAPS: 300.0000 mg | ORAL_CAPSULE | Freq: Four times a day (QID) | ORAL | Status: DC

## 2015-05-29 MED FILL — METOPROLOL TARTRATE 25 MG T: 25 | 30 days supply | Qty: 60 | Fill #6

## 2015-05-29 MED FILL — !VENTOLIN HFA INHALER: 108 (90 BAS | 25 days supply | Qty: 18 | Fill #4

## 2015-05-29 MED FILL — ?CHLORTHALIDONE 25 MG TABLE: 25 | 30 days supply | Qty: 30 | Fill #6

## 2015-05-29 MED FILL — GABAPENTIN 300 MG CAPSULE: 300 | 30 days supply | Qty: 90 | Fill #2

## 2015-05-29 MED FILL — ?LISINOPRIL 10 MG TABLET: 10 | 30 days supply | Qty: 30 | Fill #6

## 2015-05-29 NOTE — Progress Notes (Signed)
Subjective:    Patient ID: Tracy Fitzgerald, female    DOB: 1965/05/23, 50 y.o.   MRN: [id number]  HPI 50 year old female with chronic low back as well as left hip pain. Initially seen in clinic in September 2016. Back injection was "useless" Left hip injection 01/19/2015 was helpful for the sharp shooting pain in the hip area allowed her to sleep better on that side.December 2016 performed bilateral L3-L4 medial branch L5 dorsal ramus injection which produced less than 50% relief.Repeat left hip intra-articular injection under fluoroscopic guidance in January 2017 which was once again helpful with reducing sharp shooting pain in the left hip area for approximate 5 weeks. In February 2017 underwent left sacroiliac injection which was not helpful.  Patient states that she gets pains that shoot down to her left great toe. These are intermittent mainly when she is standing for per to time once this gets aggravated it can last up to 5 hours with severe pain. She gets partial relief from gabapentin 300 mg 3 times per day. She can tell when she misses a dose.  She underwent MRI of the lumbar spine On 05/28/2015. Results as below Pain Inventory Average Pain 7 Pain Right Now 10 My pain is intermittent, constant, sharp, burning, stabbing and tingling  In the last 24 hours, has pain interfered with the following? General activity 10 Relation with others 10 Enjoyment of life 10 What TIME of day is your pain at its worst? all Sleep (in general) Poor  Pain is worse with: walking, standing and some activites Pain improves with: heat/ice Relief from Meds: 1  Mobility use a cane how many minutes can you walk? 1-2 ability to climb steps?  no do you drive?  no  Function employed # of hrs/week NEED TO WORK disabled: date disabled . I need assistance with the following:  meal prep, household duties and shopping  Neuro/Psych bladder control problems weakness numbness tingling trouble  walking spasms depression anxiety  Prior Studies Any changes since last visit?  no CLINICAL DATA: Bilateral back pain, left hip and leg pain  EXAM: MRI LUMBAR SPINE WITHOUT CONTRAST  TECHNIQUE: Multiplanar, multisequence MR imaging of the lumbar spine was performed. No intravenous contrast was administered.  COMPARISON: None.  FINDINGS: Segmentation: Conventional anatomy assistant, with the last open disc space designated L5-S1.  Alignment: The vertebral bodies of the lumbar spine are normal in alignment.  Bones: The vertebral bodies of the lumbar spine are normal in size. There is normal bone marrow signal demonstrated throughout the vertebra. The visualized portions of the SI joints are unremarkable.  Conus medullaris: Extends to the L1 level and appears normal. The nerve roots of the cauda equina and the filum terminale are normal.  Paraspinal and other soft tissues: There is a 5.6 cm left renal cyst. There is no paraspinal abnormality.  Disc levels:  Disc spaces: Disc desiccation throughout the lumbar spine. Disc heights are relatively well maintained.  T12-L1: Small central disc protrusion. No evidence of neural foraminal stenosis. No central canal stenosis.  L1-L2: No significant disc bulge. No evidence of neural foraminal stenosis. No central canal stenosis.  L2-L3: No significant disc bulge. No evidence of neural foraminal stenosis. No central canal stenosis.  L3-L4: No significant disc bulge. No evidence of neural foraminal stenosis. No central canal stenosis.  L4-L5: No significant disc bulge. No evidence of neural foraminal stenosis. No central canal stenosis. Mild bilateral facet arthropathy.  L5-S1: Left foraminal/lateral disc bulge in mild left facet arthropathy resulting  in moderate left foraminal stenosis. No right foraminal stenosis. No central canal stenosis.  IMPRESSION: 1. At L5-S1 there is a left foraminal/lateral  disc bulge in mild left facet arthropathy resulting in moderate left foraminal stenosis. No right foraminal stenosis. 2. Small central disc protrusion at T12-L1.   Electronically Signed  By: Kathreen Devoid  On: 05/28/2015 17:48 Physicians involved in your care Any changes since last visit?  no   Family History  Problem Relation Age of Onset  . Hypertension Mother   . Diabetes Mother   . Uterine cancer Mother   . Heart disease Mother   . Cancer Mother     UTERINE CANCER   . Hypertension Father   . Kidney disease Father    Social History   Social History  . Marital Status: Married    Spouse Name: N/A  . Number of Children: N/A  . Years of Education: N/A   Social History Main Topics  . Smoking status: Current Every Day Smoker -- 0.25 packs/day for 33 years    Types: Cigarettes  . Smokeless tobacco: Never Used  . Alcohol Use: Yes     Comment: occasionally  . Drug Use: No  . Sexual Activity: Not Asked   Other Topics Concern  . None   Social History Narrative   Past Surgical History  Procedure Laterality Date  . Tonsillectomy    . Adenoidectomy     Past Medical History  Diagnosis Date  . Asthma   . Pain disorder   . Hypertension   . CHF (congestive heart failure) ([place])   . Depression   . Headache    BP 115/62 mmHg  Pulse 103  SpO2 96%  Opioid Risk Score:   Fall Risk Score:  `1  Depression screen PHQ 2/9  Depression screen [facility] 2/9 05/29/2015 04/28/2015 12/05/2014 12/05/2014 06/24/2014  Decreased Interest 3 3 3 3 3   Down, Depressed, Hopeless 3 3 3 3 3   PHQ - 2 Score 6 6 6 6 6   Altered sleeping - 3 3 - 3  Tired, decreased energy - 3 3 - 3  Change in appetite - 3 3 - 3  Feeling bad or failure about yourself  - 3 0 - 0  Trouble concentrating - 2 2 - 3  Moving slowly or fidgety/restless - 0 0 - 1  Suicidal thoughts - 0 0 - 0  PHQ-9 Score - 20 17 - 19     Review of Systems  All other systems reviewed and are negative.      Objective:    Physical Exam  Constitutional: She is oriented to person, place, and time. She appears well-developed and well-nourished.  HENT:  Head: Normocephalic and atraumatic.  Eyes: Conjunctivae and EOM are normal. Pupils are equal, round, and reactive to light.  Neck: Normal range of motion.  Musculoskeletal:       Left hip: She exhibits decreased range of motion.       Left ankle: She exhibits normal range of motion. No tenderness. Achilles tendon normal.       Lumbar back: She exhibits decreased range of motion.       Left foot: There is normal range of motion and no deformity.  Neurological: She is alert and oriented to person, place, and time. A sensory deficit is present. She exhibits normal muscle tone.  Reflex Scores:      Patellar reflexes are 1+ on the right side and 1+ on the left side.  Achilles reflexes are 1+ on the right side and 1+ on the left side. Psychiatric: She has a normal mood and affect.  Nursing note and vitals reviewed.    EHL left 4/5 EHL right 5/5 Sensation reduced left L5-S1 Sensation reduced right L5     Assessment & Plan:  1. Moderate foraminal stenosis L5-S1 could potentially cause intermittent L5 radicular symptoms. Would recommend Left L5-S1 transforaminal epidural steroid injection up to 3 and if not helpful surgical referral. As not had physical therapy for several years will make referral.  Went over the results of the MRI, reviewed images, reviewed spine model.Reviewed treatment options conservative care. Patient will increase gabapentin To 300 mg 4 times per day  Checked UDS Opioid risk low moderate If UDS is negative for illicit drugs or nondisclosed opiates may call in Tylenol #3 with Codeine 1 tablet twice a day  ESI in 2 weeks

## 2015-05-29 NOTE — Patient Instructions (Signed)
We'll do an epidural injection this is to help with shooting pains down your leg.  Will check urine drug screen. If this is negative we can prescribe, call-in Tylenol with Codeine 1 tablet twice a day as needed  I have increased her gabapentin to 300 g 4 times per day

## 2015-06-04 LAB — TOXASSURE SELECT,+ANTIDEPR,UR: PDF: 0

## 2015-06-05 ENCOUNTER — Telehealth: Payer: Self-pay | Admitting: *Deleted

## 2015-06-05 MED ORDER — ACETAMINOPHEN-CODEINE #3 300-30 MG PO TABS: 1.0000 | ORAL_TABLET | Freq: Two times a day (BID) | ORAL | Status: DC | PRN

## 2015-06-05 NOTE — Telephone Encounter (Signed)
Pt called stating that [facility] does not rx narcotics. She will call around for price checks and let us know where to call it in.

## 2015-06-05 NOTE — Progress Notes (Signed)
Urine drug screen for this encounter is consistent for no prescribed medication. 

## 2015-06-05 NOTE — Telephone Encounter (Signed)
UDS consistent for no medications. Called Tylenol #3 (#60) 1 bid to pharmacy and Ms Emmerich notified.

## 2015-06-10 ENCOUNTER — Ambulatory Visit: Payer: Self-pay | Attending: Family Medicine

## 2015-06-10 ENCOUNTER — Ambulatory Visit (HOSPITAL_BASED_OUTPATIENT_CLINIC_OR_DEPARTMENT_OTHER): Payer: Self-pay | Admitting: Clinical

## 2015-06-10 DIAGNOSIS — F418 Other specified anxiety disorders: Secondary | ICD-10-CM

## 2015-06-10 DIAGNOSIS — F32A Depression, unspecified: Secondary | ICD-10-CM

## 2015-06-10 DIAGNOSIS — F419 Anxiety disorder, unspecified: Principal | ICD-10-CM

## 2015-06-10 DIAGNOSIS — F329 Major depressive disorder, single episode, unspecified: Secondary | ICD-10-CM

## 2015-06-10 NOTE — Progress Notes (Signed)
ASSESSMENT: Pt currently experiencing symptoms of anxiety and depression that are likely contributing factors in conjunction with chronic pain, weight gain, and obstructive sleep apnea.  Pt needs to f/u with PCP and [facility]; would benefit from psychoeducation and brief therapeutic interventions regarding coping with symptoms of anxiety and depression, as well as psychoeducation regarding coping with chronic pain, healthy weight loss, and obstructive sleep apnea. Pt would also benefit from community resources.  Stage of Change: contemplative  PLAN: 1. F/U with behavioral health consultant in one month 2. Psychiatric Medications: none. 3. Behavioral recommendation(s):   -Begin using CPAP tonight, as shown by pulmonology nurse in office today, for improved sleep -As resources allow, continue shopping at [facility] for low-cost produce and spices -Apply for [employer] upcoming food resource program  Read educational materials regarding coping with chronic pain, obstructive sleep apnea, depression, anxiety, healthy weight loss -Consider community resources, as discussed in office visit, including utilizing disability lawyers for advice at [place]: Pt. referred by Dr Adrian Blackwater for symptoms of anxiety and depression:  Pt. reports the following symptoms/concerns: Pt states she received CPAP today, and will begin using it tonight; hopes it will improve quality of sleep. Pt prime concern today is chronic pain, along with issues related to obstructive sleep apnea, depression, anxiety, weight gain, being denied by disability, and lack of healthy food resources. Pt would like referral to dentist, has been going to pain clinic with no improvement. Pt inspired by trip to [employer] to begin preparing healthier meals, and would like to read more about increasing overall health and wellbeing.  Duration of problem: Pain issues over 30 years Severity: severe  OBJECTIVE: Orientation & Cognition: Oriented x3. Thought  processes normal and appropriate to situation. Mood: appropriate. Affect: appropriate Appearance: appropriate Risk of harm to self or others: no known risk of harm to self or others Substance use: alcohol, tobacco Assessments administered: PHQ9: 18/ GAD7: 11  Diagnosis: Anxiety and depression CPT Code: F41.8 -------------------------------------------- Other(s) present in the room: none  Time spent with patient in exam room: 60 minutes, 11:00am-12:00pm   Depression screen [facility] 2/9 06/10/2015 05/29/2015 04/28/2015 12/05/2014 12/05/2014  Decreased Interest 2 3 3 3 3   Down, Depressed, Hopeless 3 3 3 3 3   PHQ - 2 Score 5 6 6 6 6   Altered sleeping 3 - 3 3 -  Tired, decreased energy 3 - 3 3 -  Change in appetite 3 - 3 3 -  Feeling bad or failure about yourself  1 - 3 0 -  Trouble concentrating 3 - 2 2 -  Moving slowly or fidgety/restless 0 - 0 0 -  Suicidal thoughts 0 - 0 0 -  PHQ-9 Score 18 - 20 17 -   GAD 7 : Generalized Anxiety Score 06/10/2015  Nervous, Anxious, on Edge 3  Control/stop worrying 2  Worry too much - different things 2  Trouble relaxing 3  Restless 0  Easily annoyed or irritable 1  Afraid - awful might happen 0  Total GAD 7 Score 11

## 2015-06-12 ENCOUNTER — Ambulatory Visit (HOSPITAL_BASED_OUTPATIENT_CLINIC_OR_DEPARTMENT_OTHER): Payer: No Typology Code available for payment source | Admitting: Physical Medicine & Rehabilitation

## 2015-06-12 ENCOUNTER — Ambulatory Visit: Payer: No Typology Code available for payment source | Admitting: Physical Medicine & Rehabilitation

## 2015-06-12 ENCOUNTER — Encounter: Payer: Self-pay | Admitting: Physical Medicine & Rehabilitation

## 2015-06-12 VITALS — BP 117/75 | HR 92 | Resp 14

## 2015-06-12 DIAGNOSIS — M4726 Other spondylosis with radiculopathy, lumbar region: Secondary | ICD-10-CM

## 2015-06-12 MED ORDER — ACETAMINOPHEN-CODEINE #4 300-60 MG PO TABS
1.0000 | ORAL_TABLET | ORAL | Status: DC | PRN
Start: 2015-06-12 — End: 2015-06-12

## 2015-06-12 MED ORDER — ACETAMINOPHEN-CODEINE #4 300-60 MG PO TABS: 1.0000 | ORAL_TABLET | Freq: Two times a day (BID) | ORAL | Status: DC

## 2015-06-12 NOTE — Progress Notes (Signed)
  PROCEDURE RECORD [facility]   Name: Tracy Fitzgerald DOB:1965-10-13 MRN: [id number]  Date:06/12/2015  Physician: Alysia Penna, MD    Nurse/CMA: Gara Kroner  Allergies: No Known Allergies  Consent Signed: Yes.    Is patient diabetic? No.  CBG today? NA  Pregnant: No. LMP: No LMP recorded (within months). (age 50-55)  Anticoagulants: no Anti-inflammatory: no Antibiotics: no  Procedure: Transforaminal Epidural Steroid Injection Position: Prone Start Time: 1305 End Time: 1314 Fluoro Time: 28  RN/CMA Amber [facility] Home	[place]    Time 1246 1320    BP 117/75 131/63    Pulse 92 100    Respirations 14 14    O2 Sat 99 97    S/S 6 6    Pain Level 7/10 7/10     D/C home with [facility], [name] & O X 3, D/C instructions reviewed, and sits independently.

## 2015-06-12 NOTE — Patient Instructions (Signed)

## 2015-06-12 NOTE — Progress Notes (Signed)
Lumbar Left L5-S1  transforaminal epidural steroid injection under fluoroscopic guidance  Indication: Lumbosacral radiculitis is not relieved by medication management or other conservative care and interfering with self-care and mobility.   Informed consent was obtained after describing risk and benefits of the procedure with the patient, this includes bleeding, bruising, infection, paralysis and medication side effects.  The patient wishes to proceed and has given written consent.  Patient was placed in prone position.  The lumbar area was marked and prepped with Betadine.  It was entered with a 25-gauge 1-1/2 inch needle and one mL of 1% lidocaine was injected into the skin and subcutaneous tissue.  Then a 22-gauge 7in spinal needle was inserted into the Left L5-S1 intervertebral foramen under AP, lateral, and oblique view.  Then a solution containing one mL of 10 mg per mL dexamethasone and 2 mL of 1% lidocaine was injected.  The patient tolerated procedure well.  Post procedure instructions were given.  Please see post procedure form.  Instructed patient to double up on Tylenol 3 2 tablets twice a day, finish this before starting Tylenol No. 4 one tablet twice a day Continue current gabapentin dose  Left hip intra-articular injection versus repeat left L5 transforaminal 1 Month

## 2015-06-20 ENCOUNTER — Other Ambulatory Visit (HOSPITAL_COMMUNITY)
Admission: RE | Admit: 2015-06-20 | Discharge: 2015-06-20 | Disposition: A | Discharge status: Home / Self Care | Payer: No Typology Code available for payment source | Source: Ambulatory Visit | Attending: Family Medicine | Admitting: Family Medicine

## 2015-06-20 ENCOUNTER — Ambulatory Visit: Payer: No Typology Code available for payment source | Attending: Family Medicine | Admitting: Family Medicine

## 2015-06-20 ENCOUNTER — Encounter: Payer: Self-pay | Admitting: Family Medicine

## 2015-06-20 ENCOUNTER — Encounter: Payer: Self-pay | Admitting: Clinical

## 2015-06-20 VITALS — BP 103/68 | HR 83 | Temp 97.8°F | Resp 16 | Ht 66.0 in | Wt 310.0 lb

## 2015-06-20 DIAGNOSIS — A499 Bacterial infection, unspecified: Secondary | ICD-10-CM

## 2015-06-20 DIAGNOSIS — Z124 Encounter for screening for malignant neoplasm of cervix: Secondary | ICD-10-CM

## 2015-06-20 DIAGNOSIS — R87613 High grade squamous intraepithelial lesion on cytologic smear of cervix (HGSIL): Secondary | ICD-10-CM

## 2015-06-20 DIAGNOSIS — Z1231 Encounter for screening mammogram for malignant neoplasm of breast: Secondary | ICD-10-CM

## 2015-06-20 DIAGNOSIS — N760 Acute vaginitis: Secondary | ICD-10-CM | POA: Insufficient documentation

## 2015-06-20 DIAGNOSIS — Z6841 Body Mass Index (BMI) 40.0 and over, adult: Secondary | ICD-10-CM | POA: Insufficient documentation

## 2015-06-20 DIAGNOSIS — Z113 Encounter for screening for infections with a predominantly sexual mode of transmission: Secondary | ICD-10-CM | POA: Insufficient documentation

## 2015-06-20 DIAGNOSIS — Z79899 Other long term (current) drug therapy: Secondary | ICD-10-CM | POA: Insufficient documentation

## 2015-06-20 DIAGNOSIS — Z7982 Long term (current) use of aspirin: Secondary | ICD-10-CM | POA: Insufficient documentation

## 2015-06-20 DIAGNOSIS — Z1151 Encounter for screening for human papillomavirus (HPV): Secondary | ICD-10-CM | POA: Insufficient documentation

## 2015-06-20 DIAGNOSIS — Z0000 Encounter for general adult medical examination without abnormal findings: Secondary | ICD-10-CM

## 2015-06-20 DIAGNOSIS — B9689 Other specified bacterial agents as the cause of diseases classified elsewhere: Secondary | ICD-10-CM

## 2015-06-20 DIAGNOSIS — N951 Menopausal and female climacteric states: Secondary | ICD-10-CM

## 2015-06-20 DIAGNOSIS — R87810 Cervical high risk human papillomavirus (HPV) DNA test positive: Secondary | ICD-10-CM

## 2015-06-20 DIAGNOSIS — Z01411 Encounter for gynecological examination (general) (routine) with abnormal findings: Secondary | ICD-10-CM | POA: Insufficient documentation

## 2015-06-20 DIAGNOSIS — I10 Essential (primary) hypertension: Secondary | ICD-10-CM

## 2015-06-20 DIAGNOSIS — F17210 Nicotine dependence, cigarettes, uncomplicated: Secondary | ICD-10-CM | POA: Insufficient documentation

## 2015-06-20 DIAGNOSIS — M7989 Other specified soft tissue disorders: Secondary | ICD-10-CM

## 2015-06-20 DIAGNOSIS — I7300 Raynaud's syndrome without gangrene: Secondary | ICD-10-CM | POA: Insufficient documentation

## 2015-06-20 DIAGNOSIS — R229 Localized swelling, mass and lump, unspecified: Secondary | ICD-10-CM | POA: Insufficient documentation

## 2015-06-20 DIAGNOSIS — K649 Unspecified hemorrhoids: Secondary | ICD-10-CM | POA: Insufficient documentation

## 2015-06-20 LAB — POCT GLYCOSYLATED HEMOGLOBIN (HGB A1C): Hemoglobin A1C: 6.6

## 2015-06-20 MED ORDER — METOPROLOL TARTRATE 25 MG PO TABS: 12.5000 mg | ORAL_TABLET | Freq: Two times a day (BID) | ORAL | Status: DC

## 2015-06-20 MED ORDER — CLINDAMYCIN HCL 300 MG PO CAPS
300.0000 mg | ORAL_CAPSULE | Freq: Two times a day (BID) | ORAL | Status: DC
Start: 2015-06-20 — End: 2015-11-25

## 2015-06-20 MED FILL — CLINDAMYCIN HCL 300 MG CAP: 300 | 7 days supply | Qty: 14 | Fill #0

## 2015-06-20 NOTE — Progress Notes (Signed)
SUBJECTIVE:  50 y.o. female for annual routine Pap.   She complains of irregular menses for one year. No hot flashes. Hemorrhoids 2 months ago that has now resolved.   She request diabetes screening. She reports to thirst, fatigue, urinating frequency, muscle aches.   Raynauds in hands. She would like to taper off metoprolol if possible.   Bump under R axilla. Started acutely was painful. Went away. No back and deep. Not painful.   Social History  Substance Use Topics  . Smoking status: Current Every Day Smoker -- 0.25 packs/day for 33 years    Types: Cigarettes  . Smokeless tobacco: Never Used  . Alcohol Use: Yes     Comment: occasionally   Current Outpatient Prescriptions  Medication Sig Dispense Refill  . acetaminophen-codeine (TYLENOL #4) 300-60 MG tablet Take 1 tablet by mouth 2 (two) times daily. 60 tablet 0  . albuterol (PROVENTIL HFA;VENTOLIN HFA) 108 (90 BASE) MCG/ACT inhaler Inhale 2 puffs into the lungs every 4 (four) hours as needed for wheezing or shortness of breath. For shortness of breath 3 Inhaler 3  . aspirin 81 MG EC tablet Take 1 tablet (81 mg total) by mouth daily. 30 tablet   . chlorthalidone (HYGROTON) 25 MG tablet Take 1 tablet (25 mg total) by mouth every morning. 90 tablet 2  . Cholecalciferol (VITAMIN D3) 2000 UNITS TABS Take 2,000 Units by mouth daily. 30 tablet 11  . gabapentin (NEURONTIN) 300 MG capsule Take 1 capsule (300 mg total) by mouth 4 (four) times daily. 120 capsule 3  . ibuprofen (ADVIL,MOTRIN) 800 MG tablet Take 1 tablet (800 mg total) by mouth every 8 (eight) hours as needed for moderate pain. 30 tablet 1  . lisinopril (PRINIVIL,ZESTRIL) 10 MG tablet Take 1 tablet (10 mg total) by mouth daily. 90 tablet 2  . metoprolol tartrate (LOPRESSOR) 25 MG tablet Take 1 tablet (25 mg total) by mouth 2 (two) times daily. 180 tablet 2   No current facility-administered medications for this visit.   Allergies: Review of patient's allergies indicates no  known allergies.  No LMP recorded (within months).  ROS:  Feeling well. No dyspnea or chest pain on exertion.  No abdominal pain, change in bowel habits, black or bloody stools.  No urinary tract symptoms. GYN ROS: normal menses, no abnormal bleeding, pelvic pain or discharge, no breast pain or new or enlarging lumps on self exam. No neurological complaints.  OBJECTIVE:  The patient appears well, alert, oriented x 3, in no distress. BP 103/68 mmHg  Pulse 83  Temp(Src) 97.8 F (36.6 C) (Oral)  Resp 16  Ht 5\' 6"  (1.676 m)  Wt 310 lb (140.615 kg)  BMI 50.06 kg/m2  SpO2 96%  LMP  (Within Months) She is morbidly obese. Walking with a cane.   BREAST EXAM: breasts appear normal, no suspicious masses, no skin or nipple changes. There is R axilla firmness without tenderness. There is evidence of previous axillary boils   PELVIC EXAM: normal external genitalia, vulva, vagina, cervix, uterus and adnexa, RECTAL: no external hemorrhoids   ASSESSMENT:  well woman  PLAN:  mammogram pap smear

## 2015-06-20 NOTE — Patient Instructions (Addendum)
Marvella was seen today for gynecologic exam.  Diagnoses and all orders for this visit:  Pap smear for cervical cancer screening -     Cytology - PAP  Healthcare maintenance -     POCT glycosylated hemoglobin (Hb A1C)  Visit for screening mammogram -     MM DIGITAL SCREENING BILATERAL; Future  Hemorrhoids, unspecified hemorrhoid type  Perimenopausal  Essential hypertension -     metoprolol tartrate (LOPRESSOR) 25 MG tablet; Take 0.5 tablets (12.5 mg total) by mouth 2 (two) times daily. Tapering  Right axillary swelling -     clindamycin (CLEOCIN) 300 MG capsule; Take 1 capsule (300 mg total) by mouth 2 (two) times daily.  decrease metoprolol from 25 to 12.5 mg BID for two weeks, then 6.25 mg BID for two weeks   Please apply for medicaid and disability.   Your pap results will be sent to your mychart.   F/u in 4 weeks for BP and pulse check since tapering off metoprolol   Dr. Adrian Blackwater

## 2015-06-20 NOTE — Progress Notes (Signed)
Annual pap smear, Hemorrhoids  Irregular menses possible menopausal  No vaginal discharge no odor Sexually active, no pain with intercourse  Requesting to be test for DM

## 2015-06-20 NOTE — Progress Notes (Signed)
Depression screen [facility] 2/9 06/20/2015 06/10/2015 05/29/2015 04/28/2015 12/05/2014  Decreased Interest 3 2 3 3 3   Down, Depressed, Hopeless 3 3 3 3 3   PHQ - 2 Score 6 5 6 6 6   Altered sleeping 3 3 - 3 3  Tired, decreased energy 3 3 - 3 3  Change in appetite 3 3 - 3 3  Feeling bad or failure about yourself  1 1 - 3 0  Trouble concentrating 3 3 - 2 2  Moving slowly or fidgety/restless 0 0 - 0 0  Suicidal thoughts 0 0 - 0 0  PHQ-9 Score 19 18 - 20 17    GAD 7 : Generalized Anxiety Score 06/20/2015 06/10/2015  Nervous, Anxious, on Edge 3 3  Control/stop worrying 0 2  Worry too much - different things 0 2  Trouble relaxing 2 3  Restless 2 0  Easily annoyed or irritable 1 1  Afraid - awful might happen 0 0  Total GAD 7 Score 8 11

## 2015-06-20 NOTE — Assessment & Plan Note (Signed)
Recent painful hemorrhoids Pain has resolved

## 2015-06-20 NOTE — Assessment & Plan Note (Signed)
A: HTN with diastolic CHF endorse Raynaud's  P: Taper off metoprolol Tight BP control Low salt diet

## 2015-06-20 NOTE — Assessment & Plan Note (Signed)
R axillary swelling following what sounds like recent boil. Exam consistent with area of induration Clindamycin ordered Screening mammogram ordered

## 2015-06-23 LAB — CERVICOVAGINAL ANCILLARY ONLY
Chlamydia: NEGATIVE
NEISSERIA GONORRHEA: NEGATIVE
Wet Prep (BD Affirm): POSITIVE — AB

## 2015-06-23 LAB — CYTOLOGY - PAP

## 2015-06-23 MED ORDER — FLUCONAZOLE 150 MG PO TABS: 150.0000 mg | ORAL_TABLET | ORAL | Status: DC

## 2015-06-23 MED ORDER — METRONIDAZOLE 0.75 % VA GEL: 1.0000 | Freq: Every day | VAGINAL | Status: DC

## 2015-06-23 MED FILL — VANDAZOLE VAGINAL 0.75% GEL: 0.75 | 5 days supply | Qty: 70 | Fill #0

## 2015-06-23 MED FILL — FLUCONAZOLE 150 MG TABLET: 150 | 7 days supply | Qty: 2 | Fill #0

## 2015-06-23 NOTE — Addendum Note (Signed)
Addended by: Boykin Nearing on: 06/23/2015 09:14 AM   Modules accepted: Orders, SmartSet

## 2015-06-24 MED FILL — ?LISINOPRIL 10 MG TABLET: 10 | 30 days supply | Qty: 30 | Fill #7

## 2015-06-24 MED FILL — ?CHLORTHALIDONE 25 MG TABLE: 25 | 30 days supply | Qty: 30 | Fill #7

## 2015-06-25 DIAGNOSIS — D069 Carcinoma in situ of cervix, unspecified: Secondary | ICD-10-CM | POA: Insufficient documentation

## 2015-06-25 DIAGNOSIS — R87613 High grade squamous intraepithelial lesion on cytologic smear of cervix (HGSIL): Secondary | ICD-10-CM | POA: Insufficient documentation

## 2015-06-25 NOTE — Addendum Note (Signed)
Addended by: Boykin Nearing on: 06/25/2015 12:12 PM   Modules accepted: Orders, SmartSet

## 2015-06-26 ENCOUNTER — Telehealth: Payer: Self-pay | Admitting: *Deleted

## 2015-06-26 NOTE — Telephone Encounter (Signed)
Date of birth verified by pt  Results given  Pt notified Referral for GYN placed  For colposcopy  Pt verbalized understanding

## 2015-06-26 NOTE — Telephone Encounter (Signed)
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

## 2015-06-30 ENCOUNTER — Telehealth: Payer: Self-pay | Admitting: Clinical

## 2015-06-30 NOTE — Telephone Encounter (Signed)
Termination with pt; pt aware she may make an appointment with [facility] [name] until the end of April, as needed.   Tracy Fitzgerald reports that she is sleeping better with CPAP machine, and is using it every night; thankful for [employer] help in obtaining CPAP.

## 2015-07-15 ENCOUNTER — Ambulatory Visit: Payer: Self-pay | Admitting: Physical Medicine & Rehabilitation

## 2015-07-15 ENCOUNTER — Encounter: Payer: Self-pay | Attending: Physical Medicine & Rehabilitation

## 2015-07-15 DIAGNOSIS — M25552 Pain in left hip: Secondary | ICD-10-CM | POA: Insufficient documentation

## 2015-07-15 DIAGNOSIS — R208 Other disturbances of skin sensation: Secondary | ICD-10-CM | POA: Insufficient documentation

## 2015-07-15 DIAGNOSIS — G894 Chronic pain syndrome: Secondary | ICD-10-CM | POA: Insufficient documentation

## 2015-07-15 DIAGNOSIS — Z79899 Other long term (current) drug therapy: Secondary | ICD-10-CM | POA: Insufficient documentation

## 2015-07-15 DIAGNOSIS — M549 Dorsalgia, unspecified: Secondary | ICD-10-CM | POA: Insufficient documentation

## 2015-07-15 DIAGNOSIS — Z5181 Encounter for therapeutic drug level monitoring: Secondary | ICD-10-CM | POA: Insufficient documentation

## 2015-07-15 DIAGNOSIS — F17210 Nicotine dependence, cigarettes, uncomplicated: Secondary | ICD-10-CM | POA: Insufficient documentation

## 2015-07-18 ENCOUNTER — Ambulatory Visit: Payer: Self-pay

## 2015-08-01 MED FILL — LISINOPRIL 10 MG TABLET: 10 | 30 days supply | Qty: 30 | Fill #8

## 2015-08-01 MED FILL — CHLORTHALIDONE 25 MG TABLET: 25 | 30 days supply | Qty: 30 | Fill #8

## 2015-08-01 MED FILL — METOPROLOL TARTRATE 25 MG T: 25 | 30 days supply | Qty: 60 | Fill #7

## 2015-08-01 MED FILL — GABAPENTIN 300 MG CAPSULE: 300 | 30 days supply | Qty: 90 | Fill #3

## 2015-08-07 ENCOUNTER — Encounter: Payer: Self-pay | Admitting: Obstetrics and Gynecology

## 2015-08-08 ENCOUNTER — Ambulatory Visit: Payer: Self-pay | Admitting: Physical Medicine & Rehabilitation

## 2015-09-01 MED FILL — ?CHLORTHALIDONE 25 MG TABLE: 25 | 30 days supply | Qty: 30 | Fill #1

## 2015-09-01 MED FILL — ?METOPROLOL 25 MG TABLET: 25 | 30 days supply | Qty: 60 | Fill #8

## 2015-09-01 MED FILL — GABAPENTIN 300 MG CAPSULE: 300 | 30 days supply | Qty: 120 | Fill #0

## 2015-09-01 MED FILL — ?LISINOPRIL 10 MG TABLET: 10 | 30 days supply | Qty: 30 | Fill #1

## 2015-09-01 MED FILL — !VENTOLIN HFA INHALER: 108 (90 BAS | 25 days supply | Qty: 18 | Fill #5

## 2015-09-04 ENCOUNTER — Encounter: Payer: Self-pay | Admitting: Obstetrics and Gynecology

## 2015-09-05 ENCOUNTER — Ambulatory Visit: Payer: Self-pay | Admitting: Physical Medicine & Rehabilitation

## 2015-09-15 ENCOUNTER — Telehealth (HOSPITAL_COMMUNITY): Payer: Self-pay | Admitting: *Deleted

## 2015-09-15 NOTE — Telephone Encounter (Signed)
Telephoned patient at home # and left message to return call to [facility] 

## 2015-09-22 ENCOUNTER — Telehealth (HOSPITAL_COMMUNITY): Payer: Self-pay | Admitting: *Deleted

## 2015-09-22 NOTE — Telephone Encounter (Signed)
Telephoned patient at home # and left message to return call to [facility] 

## 2015-09-24 ENCOUNTER — Telehealth (HOSPITAL_COMMUNITY): Payer: Self-pay | Admitting: *Deleted

## 2015-09-24 NOTE — Telephone Encounter (Signed)
Telephoned patient at home # and left message to return call to [facility] 

## 2015-09-26 ENCOUNTER — Other Ambulatory Visit: Payer: Self-pay | Admitting: Obstetrics and Gynecology

## 2015-09-26 DIAGNOSIS — Z1231 Encounter for screening mammogram for malignant neoplasm of breast: Secondary | ICD-10-CM

## 2015-10-09 ENCOUNTER — Other Ambulatory Visit: Payer: Self-pay | Admitting: Family Medicine

## 2015-10-09 DIAGNOSIS — I10 Essential (primary) hypertension: Secondary | ICD-10-CM

## 2015-10-09 MED FILL — GABAPENTIN 300 MG CAPSULE: 300 | 30 days supply | Qty: 120 | Fill #1

## 2015-10-09 MED FILL — ?LISINOPRIL 10 MG TABLET: 10 | 30 days supply | Qty: 30 | Fill #2

## 2015-10-09 MED FILL — VENTOLIN HFA 90 MCG INHALER: 108 (90 BAS | 25 days supply | Qty: 18 | Fill #6

## 2015-10-09 MED FILL — ?CHLORTHALIDONE 25 MG TABLE: 25 | 30 days supply | Qty: 30 | Fill #2

## 2015-10-09 NOTE — Telephone Encounter (Signed)
Rx Requestes

## 2015-10-10 MED FILL — METOPROLOL TARTRATE 25 MG T: 25 | 30 days supply | Qty: 60 | Fill #0

## 2015-10-10 NOTE — Telephone Encounter (Signed)
Pt. Called requesting a refill on metoprolol tartrate (LOPRESSOR) 25 MG tablet. Pt. States she had spoken with her PCP regarding the medication. Please f/u

## 2015-10-16 ENCOUNTER — Encounter (HOSPITAL_COMMUNITY): Payer: Self-pay | Admitting: *Deleted

## 2015-10-16 ENCOUNTER — Ambulatory Visit (HOSPITAL_COMMUNITY)
Admission: RE | Admit: 2015-10-16 | Discharge: 2015-10-16 | Disposition: A | Discharge status: Home / Self Care | Payer: Medicaid Other | Source: Ambulatory Visit | Attending: Obstetrics and Gynecology | Admitting: Obstetrics and Gynecology

## 2015-10-16 ENCOUNTER — Ambulatory Visit
Admission: RE | Admit: 2015-10-16 | Discharge: 2015-10-16 | Disposition: A | Discharge status: Home / Self Care | Payer: No Typology Code available for payment source | Source: Ambulatory Visit | Attending: Obstetrics and Gynecology | Admitting: Obstetrics and Gynecology

## 2015-10-16 VITALS — BP 118/80 | Temp 99.0°F | Wt 314.2 lb

## 2015-10-16 DIAGNOSIS — Z1239 Encounter for other screening for malignant neoplasm of breast: Secondary | ICD-10-CM

## 2015-10-16 DIAGNOSIS — Z1231 Encounter for screening mammogram for malignant neoplasm of breast: Secondary | ICD-10-CM

## 2015-10-16 DIAGNOSIS — R896 Abnormal cytological findings in specimens from other organs, systems and tissues: Secondary | ICD-10-CM | POA: Diagnosis present

## 2015-10-16 DIAGNOSIS — R87613 High grade squamous intraepithelial lesion on cytologic smear of cervix (HGSIL): Secondary | ICD-10-CM

## 2015-10-16 NOTE — Progress Notes (Signed)
Patient referred to [place] by [facility] due to having an abnormal Pap smear 06/20/2015 that a colposcopy is recommended for follow up.  Pap Smear: Pap smear not completed today. Last Pap smear was 06/20/2015 at [facility] and [facility]. Referred patient to the [facility] for colpscopy. Appointment scheduled for Wednesday, November 05, 2015 at 1440. Per patient has no history of an abnormal Pap smear prior to the most recent Pap smear. Last Pap smear result is in EPIC.  Physical exam: Breasts Left breast larger than right breast that per patient is normal for her. No skin abnormalities bilateral breasts. No nipple retraction bilateral breasts. No nipple discharge bilateral breasts. No lymphadenopathy. No lumps palpated bilateral breasts. No complaints of pain or tenderness on exam. Referred patient to the [place] for a screening mammogram. Appointment scheduled for Thursday, October 16, 2015 at 1140.        Pelvic/Bimanual No Pap smear completed today since last Pap smear was 06/20/2015. Pap smear not indicated per [facility] guidelines.   Smoking History: Patient is a current smoker. Smoking cessation discussed with patient. Referred patient to the [facility] Quitline and gave resources for free classes offered at [facility].  Patient Navigation: Patient education provided. Access to services provided for patient through [facility] program.   Colorectal Cancer Screening: Patient has never had a colonoscopy. No complaints today.

## 2015-10-16 NOTE — Patient Instructions (Addendum)
Explained self breast awareness to [facility]. Explained the colposcopy the needed follow up for the abnormal Pap smear.  Referred patient to the [place] for colpscopy. Appointment scheduled for Wednesday, November 05, 2015 at 1440. Referred patient to the [place] for a screening mammogram. Appointment scheduled for Thursday, October 16, 2015 at 1140. Let [facility] know the [facility] will follow up with her within the next couple weeks with results of mammogram by letter or phone. Smoking cessation discussed with patient. Referred patient to the [facility] Quitline and gave resources for free classes offered at [facility]. Azelyn Winker verbalized understanding.  Inza Mikrut, Arvil Chaco, RN 1:15 PM

## 2015-10-20 ENCOUNTER — Encounter (HOSPITAL_COMMUNITY): Payer: Self-pay | Admitting: *Deleted

## 2015-11-03 ENCOUNTER — Encounter: Payer: Self-pay | Admitting: Family Medicine

## 2015-11-04 ENCOUNTER — Other Ambulatory Visit: Payer: Self-pay | Admitting: Family Medicine

## 2015-11-04 MED FILL — METOPROLOL TARTRATE 25 MG T: 25 | 30 days supply | Qty: 60 | Fill #1

## 2015-11-04 MED FILL — LISINOPRIL 10 MG TABLET: 10 | 30 days supply | Qty: 30 | Fill #0

## 2015-11-04 MED FILL — ?CHLORTHALIDONE 25 MG TABLE: 25 | 30 days supply | Qty: 30 | Fill #0

## 2015-11-04 MED FILL — GABAPENTIN 300 MG CAPSULE: 300 | 30 days supply | Qty: 120 | Fill #2

## 2015-11-04 MED FILL — VENTOLIN HFA 90 MCG INHALER: 108 (90 BAS | 25 days supply | Qty: 18 | Fill #7

## 2015-11-05 ENCOUNTER — Ambulatory Visit (INDEPENDENT_AMBULATORY_CARE_PROVIDER_SITE_OTHER): Payer: Self-pay | Admitting: Obstetrics & Gynecology

## 2015-11-05 ENCOUNTER — Other Ambulatory Visit (HOSPITAL_COMMUNITY)
Admission: RE | Admit: 2015-11-05 | Discharge: 2015-11-05 | Disposition: A | Discharge status: Home / Self Care | Payer: Medicaid Other | Source: Ambulatory Visit | Attending: Obstetrics & Gynecology | Admitting: Obstetrics & Gynecology

## 2015-11-05 ENCOUNTER — Encounter: Payer: Self-pay | Admitting: Obstetrics & Gynecology

## 2015-11-05 ENCOUNTER — Ambulatory Visit: Payer: Self-pay | Admitting: Clinical

## 2015-11-05 DIAGNOSIS — D069 Carcinoma in situ of cervix, unspecified: Secondary | ICD-10-CM | POA: Insufficient documentation

## 2015-11-05 DIAGNOSIS — Z658 Other specified problems related to psychosocial circumstances: Secondary | ICD-10-CM

## 2015-11-05 DIAGNOSIS — R87613 High grade squamous intraepithelial lesion on cytologic smear of cervix (HGSIL): Secondary | ICD-10-CM

## 2015-11-05 DIAGNOSIS — Z3202 Encounter for pregnancy test, result negative: Secondary | ICD-10-CM

## 2015-11-05 LAB — POCT PREGNANCY, URINE: Preg Test, Ur: NEGATIVE

## 2015-11-05 NOTE — Progress Notes (Signed)
  ASSESSMENT: Pt currently experiencing Psychosocial stressors. Pt would benefit from brief therapeutic intervention today.  Stage of Change: contemplative  PLAN: 1. F/U with behavioral health clinician as needed 2. Psychiatric Medications: none 3. Behavioral recommendations:   -Continue to advocate for self regarding utilizing community resources and accessing healthcare  SUBJECTIVE: Pt. referred by Emeterio Reeve, MD, for psychosocial f/u Pt. reports the following symptoms/concerns: Pt concerned today about issues with [facility] transportation; pt appreciates being able to use CPAP machine nightly, and is routinely utilizing food resources via [employer] Duration of problem: Today Severity: mild   OBJECTIVE: Orientation & Cognition: Oriented x3. Thought processes normal and appropriate to situation. Mood: appropriate Affect: appropriate Appearance: appropriate Risk of harm to self or others: no known risk of harm to self or others Substance use: none Assessments administered:none  Diagnosis: Psychosocial stressors CPT Code: Z65.8  -------------------------------------------- Other(s) present in the room: none  Time spent with patient in exam room: 10 minutes 3:35 to 3:45pm  Depression screen [facility] 2/9 06/20/2015 06/10/2015 05/29/2015 04/28/2015 12/05/2014  Decreased Interest 3 2 3 3 3   Down, Depressed, Hopeless 3 3 3 3 3   PHQ - 2 Score 6 5 6 6 6   Altered sleeping 3 3 - 3 3  Tired, decreased energy 3 3 - 3 3  Change in appetite 3 3 - 3 3  Feeling bad or failure about yourself  1 1 - 3 0  Trouble concentrating 3 3 - 2 2  Moving slowly or fidgety/restless 0 0 - 0 0  Suicidal thoughts 0 0 - 0 0  PHQ-9 Score 19 18 - 20 17   GAD 7 : Generalized Anxiety Score 06/20/2015 06/10/2015  Nervous, Anxious, on Edge 3 3  Control/stop worrying 0 2  Worry too much - different things 0 2  Trouble relaxing 2 3  Restless 2 0  Easily annoyed or irritable 1 1  Afraid - awful might happen 0 0   Total GAD 7 Score 8 11

## 2015-11-05 NOTE — Progress Notes (Signed)
Patient ID: Tracy Fitzgerald, female   DOB: 08-Dec-1965, 50 y.o.   MRN: [id number]  Chief Complaint  Patient presents with  . Colposcopy    HPI Tracy Fitzgerald is a 50 y.o. female.  [id number] Patient's last menstrual period was 10/28/2015 (exact date).  HPI  Indications: Pap smear on April 2017 showed: high-grade squamous intraepithelial neoplasia  (HGSIL-encompassing moderate and severe dysplasia). Previous colposcopy: no. Prior cervical treatment: no treatment.  Past Medical History:  Diagnosis Date  . Asthma   . CHF (congestive heart failure) ([place])   . Depression   . Headache   . Hypertension   . Pain disorder     Past Surgical History:  Procedure Laterality Date  . ADENOIDECTOMY    . TONSILLECTOMY      Family History  Problem Relation Age of Onset  . Hypertension Mother   . Diabetes Mother   . Uterine cancer Mother   . Heart disease Mother   . Cancer Mother     UTERINE CANCER   . Hypertension Father   . Kidney disease Father     Social History Social History  Substance Use Topics  . Smoking status: Current Every Day Smoker    Packs/day: 0.25    Years: 33.00    Types: Cigarettes  . Smokeless tobacco: Never Used  . Alcohol use Yes     Comment: occasionally    No Known Allergies  Current Outpatient Prescriptions  Medication Sig Dispense Refill  . albuterol (PROVENTIL HFA;VENTOLIN HFA) 108 (90 BASE) MCG/ACT inhaler Inhale 2 puffs into the lungs every 4 (four) hours as needed for wheezing or shortness of breath. For shortness of breath 3 Inhaler 3  . Aspirin-Caffeine 845-65 MG PACK Take by mouth.    . chlorthalidone (HYGROTON) 25 MG tablet Take 1 tablet (25 mg total) by mouth every morning. 90 tablet 2  . Cholecalciferol (VITAMIN D3) 2000 UNITS TABS Take 2,000 Units by mouth daily. 30 tablet 11  . gabapentin (NEURONTIN) 300 MG capsule Take 1 capsule (300 mg total) by mouth 4 (four) times daily. 120 capsule 3  . ibuprofen (ADVIL,MOTRIN) 800 MG tablet Take 1 tablet  (800 mg total) by mouth every 8 (eight) hours as needed for moderate pain. 30 tablet 1  . lisinopril (PRINIVIL,ZESTRIL) 10 MG tablet Take 1 tablet (10 mg total) by mouth daily. 30 tablet 0  . metoprolol tartrate (LOPRESSOR) 25 MG tablet TAKE 1 TABLET BY MOUTH 2 TIMES DAILY 180 tablet 2  . acetaminophen-codeine (TYLENOL #4) 300-60 MG tablet Take 1 tablet by mouth 2 (two) times daily. (Patient not taking: Reported on 10/16/2015) 60 tablet 0  . aspirin 81 MG EC tablet Take 1 tablet (81 mg total) by mouth daily. (Patient not taking: Reported on 10/16/2015) 30 tablet   . clindamycin (CLEOCIN) 300 MG capsule Take 1 capsule (300 mg total) by mouth 2 (two) times daily. (Patient not taking: Reported on 10/16/2015) 14 capsule 0  . fluconazole (DIFLUCAN) 150 MG tablet Take 1 tablet (150 mg total) by mouth every 3 (three) days. (Patient not taking: Reported on 10/16/2015) 2 tablet 0  . metroNIDAZOLE (METROGEL VAGINAL) 0.75 % vaginal gel Place 1 Applicatorful vaginally at bedtime. (Patient not taking: Reported on 10/16/2015) 70 g 0   No current facility-administered medications for this visit.     Review of Systems Review of Systems  Blood pressure 107/72, pulse 92, weight (!) 317 lb 11.2 oz (144.1 kg), last menstrual period 10/28/2015.  Physical Exam Physical Exam  Data Reviewed Pap  result 06/20/15  Assessment    Procedure Details  The risks and benefits of the procedure and Written informed consent obtained.  Speculum placed in vagina and excellent visualization of cervix achieved, cervix swabbed x 3 with acetic acid solution. AWE ant and posterior, 1 cm into os noted, entire lesion seen, SCJ seen , adequate colposcopy Specimens: 12 and 6 o'clock and ECC Monsel's applied Complications: none.     Plan    Specimens labelled and sent to Pathology. Return to discuss Pathology results in 2 weeks.      Kingston Shawgo 11/05/2015, 4:05 PM

## 2015-11-11 ENCOUNTER — Telehealth: Payer: Self-pay

## 2015-11-11 NOTE — Telephone Encounter (Signed)
-----   Message from Woodroe Mode, MD sent at 11/08/2015  8:56 PM EDT ----- CIN 3 needs LEEP

## 2015-11-11 NOTE — Telephone Encounter (Signed)
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 Note sent to the admin pool to schedule appointment.

## 2015-11-25 ENCOUNTER — Ambulatory Visit: Payer: Self-pay | Attending: Family Medicine | Admitting: Family Medicine

## 2015-11-25 ENCOUNTER — Telehealth (HOSPITAL_COMMUNITY): Payer: Self-pay | Admitting: *Deleted

## 2015-11-25 ENCOUNTER — Encounter: Payer: Self-pay | Admitting: Family Medicine

## 2015-11-25 ENCOUNTER — Ambulatory Visit: Payer: Self-pay

## 2015-11-25 VITALS — BP 104/71 | HR 85 | Temp 98.2°F | Wt 319.4 lb

## 2015-11-25 DIAGNOSIS — Z720 Tobacco use: Secondary | ICD-10-CM

## 2015-11-25 DIAGNOSIS — R7309 Other abnormal glucose: Secondary | ICD-10-CM

## 2015-11-25 DIAGNOSIS — F17200 Nicotine dependence, unspecified, uncomplicated: Secondary | ICD-10-CM | POA: Insufficient documentation

## 2015-11-25 DIAGNOSIS — E1142 Type 2 diabetes mellitus with diabetic polyneuropathy: Secondary | ICD-10-CM | POA: Insufficient documentation

## 2015-11-25 DIAGNOSIS — G8929 Other chronic pain: Secondary | ICD-10-CM | POA: Insufficient documentation

## 2015-11-25 DIAGNOSIS — Z23 Encounter for immunization: Secondary | ICD-10-CM

## 2015-11-25 DIAGNOSIS — R208 Other disturbances of skin sensation: Secondary | ICD-10-CM

## 2015-11-25 DIAGNOSIS — R0602 Shortness of breath: Secondary | ICD-10-CM

## 2015-11-25 DIAGNOSIS — R0789 Other chest pain: Secondary | ICD-10-CM

## 2015-11-25 DIAGNOSIS — M4726 Other spondylosis with radiculopathy, lumbar region: Secondary | ICD-10-CM

## 2015-11-25 DIAGNOSIS — I10 Essential (primary) hypertension: Secondary | ICD-10-CM

## 2015-11-25 DIAGNOSIS — R200 Anesthesia of skin: Secondary | ICD-10-CM

## 2015-11-25 DIAGNOSIS — E119 Type 2 diabetes mellitus without complications: Secondary | ICD-10-CM

## 2015-11-25 DIAGNOSIS — M549 Dorsalgia, unspecified: Secondary | ICD-10-CM

## 2015-11-25 LAB — BASIC METABOLIC PANEL WITH GFR
BUN: 17 mg/dL (ref 7–25)
CO2: 28 mmol/L (ref 20–31)
Calcium: 9.8 mg/dL (ref 8.6–10.4)
Chloride: 101 mmol/L (ref 98–110)
Creat: 0.93 mg/dL (ref 0.50–1.05)
GFR, EST AFRICAN AMERICAN: 83 mL/min (ref >=60)
GFR, EST NON AFRICAN AMERICAN: 72 mL/min (ref >=60)
GLUCOSE: 127 mg/dL — AB (ref 65–99)
POTASSIUM: 4.3 mmol/L (ref 3.5–5.3)
Sodium: 137 mmol/L (ref 135–146)

## 2015-11-25 LAB — GLUCOSE, POCT (MANUAL RESULT ENTRY): POC Glucose: 145 mg/dl — AB (ref 70–99)

## 2015-11-25 LAB — POCT GLYCOSYLATED HEMOGLOBIN (HGB A1C): HEMOGLOBIN A1C: 7

## 2015-11-25 MED ORDER — ACETAMINOPHEN-CODEINE #3 300-30 MG PO TABS: 1.0000 | ORAL_TABLET | Freq: Three times a day (TID) | ORAL | 0 refills | Status: DC | PRN

## 2015-11-25 MED ORDER — IBUPROFEN 800 MG PO TABS: 800.0000 mg | ORAL_TABLET | Freq: Three times a day (TID) | ORAL | 1 refills | Status: DC | PRN

## 2015-11-25 MED ORDER — METOPROLOL TARTRATE 25 MG PO TABS: 25.0000 mg | ORAL_TABLET | Freq: Two times a day (BID) | ORAL | 3 refills | Status: DC

## 2015-11-25 MED ORDER — ASPIRIN 81 MG PO TBEC: 81.0000 mg | DELAYED_RELEASE_TABLET | Freq: Every day | ORAL | 3 refills | Status: DC

## 2015-11-25 MED ORDER — ALBUTEROL SULFATE HFA 108 (90 BASE) MCG/ACT IN AERS: 2.0000 | INHALATION_SPRAY | RESPIRATORY_TRACT | 3 refills | Status: DC | PRN

## 2015-11-25 MED ORDER — CHLORTHALIDONE 25 MG PO TABS: 25.0000 mg | ORAL_TABLET | ORAL | 2 refills | Status: DC

## 2015-11-25 MED ORDER — GABAPENTIN 300 MG PO CAPS: 300.0000 mg | ORAL_CAPSULE | Freq: Four times a day (QID) | ORAL | 3 refills | Status: DC

## 2015-11-25 MED ORDER — BUPROPION HCL ER (SR) 150 MG PO TB12: ORAL_TABLET | ORAL | 2 refills | Status: DC

## 2015-11-25 MED ORDER — LISINOPRIL 10 MG PO TABS: 10.0000 mg | ORAL_TABLET | Freq: Every day | ORAL | 3 refills | Status: DC

## 2015-11-25 NOTE — Assessment & Plan Note (Signed)
Chronic back pain in smoker with morbid obesity. Pain limiting mobility.   Gabapentin, tylenol #3, ibuprofen for pain control Continue use of cane Rx for cart provided Weight loss encouraged Smoking cessation encouraged

## 2015-11-25 NOTE — Assessment & Plan Note (Signed)
HTN controlled Continue regimen

## 2015-11-25 NOTE — Telephone Encounter (Signed)
Telephoned patient at home number and left message to return call to [facility] 

## 2015-11-25 NOTE — Patient Instructions (Addendum)
Tracy Fitzgerald was seen today for hypertension.  Diagnoses and all orders for this visit:  Essential hypertension -     BASIC METABOLIC PANEL WITH GFR -     chlorthalidone (HYGROTON) 25 MG tablet; Take 1 tablet (25 mg total) by mouth every morning. -     lisinopril (PRINIVIL,ZESTRIL) 10 MG tablet; Take 1 tablet (10 mg total) by mouth daily. -     metoprolol tartrate (LOPRESSOR) 25 MG tablet; Take 1 tablet (25 mg total) by mouth 2 (two) times daily.  Elevated hemoglobin A1c -     HgB A1c -     Glucose (CBG)  Osteoarthritis of spine with radiculopathy, lumbar region -     acetaminophen-codeine (TYLENOL #3) 300-30 MG tablet; Take 1 tablet by mouth every 8 (eight) hours as needed for moderate pain.  Tobacco abuse -     buPROPion (WELLBUTRIN SR) 150 MG 12 hr tablet; Take by mouth Once daily for 3 days then twice daily  Numbness of foot -     gabapentin (NEURONTIN) 300 MG capsule; Take 1 capsule (300 mg total) by mouth 4 (four) times daily.  Bilateral back pain, unspecified location -     ibuprofen (ADVIL,MOTRIN) 800 MG tablet; Take 1 tablet (800 mg total) by mouth every 8 (eight) hours as needed for moderate pain. -     acetaminophen-codeine (TYLENOL #3) 300-30 MG tablet; Take 1 tablet by mouth every 8 (eight) hours as needed for moderate pain.  Atypical chest pain -     aspirin 81 MG EC tablet; Take 1 tablet (81 mg total) by mouth daily.  Shortness of breath -     albuterol (PROVENTIL HFA;VENTOLIN HFA) 108 (90 Base) MCG/ACT inhaler; Inhale 2 puffs into the lungs every 4 (four) hours as needed for wheezing or shortness of breath. For shortness of breath  Smoking cessation support: smoking cessation hotline: 1-800-QUIT-NOW.  Smoking cessation classes are available through [facility]. Call 7201950748 or visit our website at [email or web address]  F/u in 6 weeks for smoking cessation, call for tylenol #3 refill.   Dr. Adrian Blackwater

## 2015-11-25 NOTE — Progress Notes (Signed)
Pt needs refills on medications, and referral to mental health, and dental referral Flu shot , tetanus shot

## 2015-11-25 NOTE — Progress Notes (Signed)
Subjective:  Patient ID: Tracy Fitzgerald, female    DOB: 1965/06/07  Age: 50 y.o. MRN: [id number]  CC: Hypertension   HPI Tracy Fitzgerald presents for    1. CHRONIC HYPERTENSION  Disease Monitoring  Blood pressure range: not checking   Chest pain: no   Dyspnea: yes   Claudication: no   Medication compliance: yes  Medication Side Effects  Lightheadedness: no   Urinary frequency: no   Edema: no    Preventitive Healthcare:  Exercise: no   Diet Pattern: eating low sugar and increasing veggies   Salt Restriction: not adding salt to food   2. Shortness of breath: stable. Using albuterol 2-3 times per day. Smoking 15 cigs per day. Desire to quit. Has high stress and is not ready to quit right now.   3. Chronic low back: unable to walk for prolonged periods of time due to her back pain and numbness and pins and needles sensation in her feet. She cannot walk 1/2 block. She uses a cane. She has tried walking with a shopping cart at stores but this is even too difficult. She would like a prescription for a cart. She is uninsured and unemployed. She is taking gabapentin and ibuprofen for her chronic pain. She has been lost to f/u with pain management.   Social History  Substance Use Topics  . Smoking status: Current Every Day Smoker    Packs/day: 0.25    Years: 33.00    Types: Cigarettes  . Smokeless tobacco: Never Used  . Alcohol use Yes     Comment: occasionally    Outpatient Medications Prior to Visit  Medication Sig Dispense Refill  . acetaminophen-codeine (TYLENOL #4) 300-60 MG tablet Take 1 tablet by mouth 2 (two) times daily. (Patient not taking: Reported on 10/16/2015) 60 tablet 0  . albuterol (PROVENTIL HFA;VENTOLIN HFA) 108 (90 BASE) MCG/ACT inhaler Inhale 2 puffs into the lungs every 4 (four) hours as needed for wheezing or shortness of breath. For shortness of breath 3 Inhaler 3  . aspirin 81 MG EC tablet Take 1 tablet (81 mg total) by mouth daily. (Patient not taking:  Reported on 10/16/2015) 30 tablet   . Aspirin-Caffeine 845-65 MG PACK Take by mouth.    . chlorthalidone (HYGROTON) 25 MG tablet Take 1 tablet (25 mg total) by mouth every morning. 90 tablet 2  . Cholecalciferol (VITAMIN D3) 2000 UNITS TABS Take 2,000 Units by mouth daily. 30 tablet 11  . clindamycin (CLEOCIN) 300 MG capsule Take 1 capsule (300 mg total) by mouth 2 (two) times daily. (Patient not taking: Reported on 10/16/2015) 14 capsule 0  . fluconazole (DIFLUCAN) 150 MG tablet Take 1 tablet (150 mg total) by mouth every 3 (three) days. (Patient not taking: Reported on 10/16/2015) 2 tablet 0  . gabapentin (NEURONTIN) 300 MG capsule Take 1 capsule (300 mg total) by mouth 4 (four) times daily. 120 capsule 3  . ibuprofen (ADVIL,MOTRIN) 800 MG tablet Take 1 tablet (800 mg total) by mouth every 8 (eight) hours as needed for moderate pain. 30 tablet 1  . lisinopril (PRINIVIL,ZESTRIL) 10 MG tablet Take 1 tablet (10 mg total) by mouth daily. 30 tablet 0  . metoprolol tartrate (LOPRESSOR) 25 MG tablet TAKE 1 TABLET BY MOUTH 2 TIMES DAILY 180 tablet 2  . metroNIDAZOLE (METROGEL VAGINAL) 0.75 % vaginal gel Place 1 Applicatorful vaginally at bedtime. (Patient not taking: Reported on 10/16/2015) 70 g 0   No facility-administered medications prior to visit.     ROS  Review of Systems  Constitutional: Negative for chills and fever.  Eyes: Negative for visual disturbance.  Respiratory: Positive for shortness of breath.   Cardiovascular: Negative for chest pain.  Gastrointestinal: Negative for abdominal pain and blood in stool.  Musculoskeletal: Positive for arthralgias (L hip ), back pain and gait problem.  Skin: Negative for rash.  Allergic/Immunologic: Negative for immunocompromised state.  Neurological: Positive for numbness (in feet and L leg ).  Hematological: Negative for adenopathy. Does not bruise/bleed easily.  Psychiatric/Behavioral: Negative for dysphoric mood and suicidal ideas.    Objective:  BP  104/71 (BP Location: Left Arm, Patient Position: Sitting, Cuff Size: Large)   Pulse 85   Temp 98.2 F (36.8 C) (Oral)   Wt (!) 319 lb 6.4 oz (144.9 kg)   LMP 10/28/2015 (Exact Date)   SpO2 95%   BMI 51.55 kg/m   BP/Weight 11/25/2015 [id number]  Systolic BP 123456 [id number]  Diastolic BP 71 72 80  Wt. (Lbs) 319.4 317.7 314.2  BMI 51.55 51.28 50.71   Physical Exam  Constitutional: She is oriented to person, place, and time. She appears well-developed and well-nourished. No distress.  Obesity  Walking with a cane   HENT:  Head: Normocephalic and atraumatic.  Cardiovascular: Normal rate, regular rhythm, normal heart sounds and intact distal pulses.   Pulmonary/Chest: Effort normal and breath sounds normal.  Musculoskeletal: She exhibits no edema.  Neurological: She is alert and oriented to person, place, and time.  Skin: Skin is warm and dry. No rash noted.  Psychiatric: She has a normal mood and affect.   Lab Results  Component Value Date   HGBA1C 6.6 06/20/2015    Assessment & Plan:  Tracy Fitzgerald was seen today for hypertension.  Diagnoses and all orders for this visit:  Essential hypertension -     BASIC METABOLIC PANEL WITH GFR -     chlorthalidone (HYGROTON) 25 MG tablet; Take 1 tablet (25 mg total) by mouth every morning. -     lisinopril (PRINIVIL,ZESTRIL) 10 MG tablet; Take 1 tablet (10 mg total) by mouth daily. -     metoprolol tartrate (LOPRESSOR) 25 MG tablet; Take 1 tablet (25 mg total) by mouth 2 (two) times daily.  Elevated hemoglobin A1c -     HgB A1c -     Glucose (CBG)  Osteoarthritis of spine with radiculopathy, lumbar region -     acetaminophen-codeine (TYLENOL #3) 300-30 MG tablet; Take 1 tablet by mouth every 8 (eight) hours as needed for moderate pain.  Tobacco abuse -     buPROPion (WELLBUTRIN SR) 150 MG 12 hr tablet; Take by mouth Once daily for 3 days then twice daily  Numbness of foot -     gabapentin (NEURONTIN) 300 MG capsule; Take 1 capsule  (300 mg total) by mouth 4 (four) times daily.  Bilateral back pain, unspecified location -     ibuprofen (ADVIL,MOTRIN) 800 MG tablet; Take 1 tablet (800 mg total) by mouth every 8 (eight) hours as needed for moderate pain. -     acetaminophen-codeine (TYLENOL #3) 300-30 MG tablet; Take 1 tablet by mouth every 8 (eight) hours as needed for moderate pain.  Atypical chest pain -     aspirin 81 MG EC tablet; Take 1 tablet (81 mg total) by mouth daily.  Shortness of breath -     albuterol (PROVENTIL HFA;VENTOLIN HFA) 108 (90 Base) MCG/ACT inhaler; Inhale 2 puffs into the lungs every 4 (four) hours as needed for wheezing or  shortness of breath. For shortness of breath  New onset type 2 diabetes mellitus ([place])  Other orders -     Flu Vaccine QUAD 36+ mos IM -     Tdap vaccine greater than or equal to 7yo IM   There are no diagnoses linked to this encounter.  No orders of the defined types were placed in this encounter.   Follow-up: Return in about 6 weeks (around 01/06/2016) for smoking cessation .   Boykin Nearing MD

## 2015-11-25 NOTE — Assessment & Plan Note (Signed)
New onset diabetes in obese patient confirmed on A1c, no hyperglycemia  Plan: Low carb diet Increase activity  Add metformin at f/u

## 2015-12-08 MED FILL — LISINOPRIL 10 MG TABLET: 10 | 30 days supply | Qty: 30 | Fill #0

## 2015-12-08 MED FILL — ?METOPROLOL TARTRATE 25 MG: 25 | 30 days supply | Qty: 60 | Fill #2

## 2015-12-08 MED FILL — GABAPENTIN 300 MG CAPSULE: 300 | 30 days supply | Qty: 120 | Fill #3

## 2015-12-08 MED FILL — ?CHLORTHALIDONE 25 MG TABLE: 25 | 30 days supply | Qty: 30 | Fill #0

## 2015-12-10 MED FILL — VENTOLIN HFA 90 MCG INHALER: 108 (90 BAS | 20 days supply | Qty: 18 | Fill #0

## 2015-12-10 MED FILL — BUPROPION SR 150 MG TABLET: 150 | 31 days supply | Qty: 60 | Fill #0

## 2015-12-16 ENCOUNTER — Telehealth: Payer: Self-pay | Admitting: Obstetrics & Gynecology

## 2015-12-16 ENCOUNTER — Encounter: Payer: Self-pay | Admitting: Obstetrics & Gynecology

## 2015-12-16 NOTE — Telephone Encounter (Signed)
Called patient number listed in Epic. Was not a working number.

## 2016-01-07 MED FILL — ?METOPROLOL TARTRATE 25 MG: 25 | 30 days supply | Qty: 60 | Fill #3

## 2016-01-07 MED FILL — GABAPENTIN 300 MG CAPSULE: 300 | 30 days supply | Qty: 120 | Fill #0

## 2016-01-07 MED FILL — ?CHLORTHALIDONE 25 MG TABLE: 25 | 30 days supply | Qty: 30 | Fill #1

## 2016-01-07 MED FILL — BUPROPION SR 150 MG TABLET: 150 | 31 days supply | Qty: 60 | Fill #1

## 2016-01-07 MED FILL — LISINOPRIL 10 MG TABLET: 10 | 30 days supply | Qty: 30 | Fill #1

## 2016-02-04 ENCOUNTER — Encounter: Payer: Self-pay | Admitting: Family Medicine

## 2016-02-10 ENCOUNTER — Other Ambulatory Visit: Payer: Self-pay | Admitting: Family Medicine

## 2016-02-10 DIAGNOSIS — R0602 Shortness of breath: Secondary | ICD-10-CM

## 2016-02-10 MED FILL — BUPROPION SR 150 MG TABLET: 150 | 31 days supply | Qty: 60 | Fill #2

## 2016-02-10 MED FILL — METOPROLOL TARTRATE 25 MG T: 25 | 30 days supply | Qty: 60 | Fill #4

## 2016-02-10 MED FILL — ?CHLORTHALIDONE 25 MG TABLE: 25 | 30 days supply | Qty: 30 | Fill #2

## 2016-02-10 MED FILL — ?LISINOPRIL 10 MG TABLET: 10 | 30 days supply | Qty: 30 | Fill #2

## 2016-02-10 MED FILL — GABAPENTIN 300 MG CAPSULE: 300 | 30 days supply | Qty: 120 | Fill #1

## 2016-02-11 MED FILL — VENTOLIN HFA 90 MCG INHALER: 108 (90 BAS | 30 days supply | Qty: 18 | Fill #0

## 2016-02-24 ENCOUNTER — Other Ambulatory Visit (HOSPITAL_COMMUNITY)
Admission: RE | Admit: 2016-02-24 | Discharge: 2016-02-24 | Disposition: A | Discharge status: Home / Self Care | Payer: Medicaid Other | Source: Ambulatory Visit | Attending: Obstetrics & Gynecology | Admitting: Obstetrics & Gynecology

## 2016-02-24 ENCOUNTER — Encounter: Payer: Self-pay | Admitting: Obstetrics & Gynecology

## 2016-02-24 ENCOUNTER — Ambulatory Visit (INDEPENDENT_AMBULATORY_CARE_PROVIDER_SITE_OTHER): Payer: Medicaid Other | Admitting: Obstetrics & Gynecology

## 2016-02-24 DIAGNOSIS — R87613 High grade squamous intraepithelial lesion on cytologic smear of cervix (HGSIL): Secondary | ICD-10-CM | POA: Diagnosis not present

## 2016-02-24 NOTE — Progress Notes (Signed)
Patient presents for LEEP, below is her visit for colposcopy HPI Tracy Fitzgerald is a 50 y.o. female.  [id number] Patient's last menstrual period was 10/28/2015 (exact date).  HPI  Indications: Pap smear on April 2017 showed: high-grade squamous intraepithelial neoplasia  (HGSIL-encompassing moderate and severe dysplasia). Previous colposcopy: no. Prior cervical treatment: no treatment.      Past Medical History:  Diagnosis Date  . Asthma   . CHF (congestive heart failure) ([place])   . Depression   . Headache   . Hypertension   . Pain disorder          Past Surgical History:  Procedure Laterality Date  . ADENOIDECTOMY    . TONSILLECTOMY            Family History  Problem Relation Age of Onset  . Hypertension Mother   . Diabetes Mother   . Uterine cancer Mother   . Heart disease Mother   . Cancer Mother     UTERINE CANCER   . Hypertension Father   . Kidney disease Father     Social History        Social History  Substance Use Topics  . Smoking status: Current Every Day Smoker    Packs/day: 0.25    Years: 33.00    Types: Cigarettes  . Smokeless tobacco: Never Used  . Alcohol use Yes      Comment: occasionally    No Known Allergies        Current Outpatient Prescriptions  Medication Sig Dispense Refill  . albuterol (PROVENTIL HFA;VENTOLIN HFA) 108 (90 BASE) MCG/ACT inhaler Inhale 2 puffs into the lungs every 4 (four) hours as needed for wheezing or shortness of breath. For shortness of breath 3 Inhaler 3  . Aspirin-Caffeine 845-65 MG PACK Take by mouth.    . chlorthalidone (HYGROTON) 25 MG tablet Take 1 tablet (25 mg total) by mouth every morning. 90 tablet 2  . Cholecalciferol (VITAMIN D3) 2000 UNITS TABS Take 2,000 Units by mouth daily. 30 tablet 11  . gabapentin (NEURONTIN) 300 MG capsule Take 1 capsule (300 mg total) by mouth 4 (four) times daily. 120 capsule 3  . ibuprofen (ADVIL,MOTRIN) 800 MG tablet Take 1 tablet  (800 mg total) by mouth every 8 (eight) hours as needed for moderate pain. 30 tablet 1  . lisinopril (PRINIVIL,ZESTRIL) 10 MG tablet Take 1 tablet (10 mg total) by mouth daily. 30 tablet 0  . metoprolol tartrate (LOPRESSOR) 25 MG tablet TAKE 1 TABLET BY MOUTH 2 TIMES DAILY 180 tablet 2  . acetaminophen-codeine (TYLENOL #4) 300-60 MG tablet Take 1 tablet by mouth 2 (two) times daily. (Patient not taking: Reported on 10/16/2015) 60 tablet 0  . aspirin 81 MG EC tablet Take 1 tablet (81 mg total) by mouth daily. (Patient not taking: Reported on 10/16/2015) 30 tablet   . clindamycin (CLEOCIN) 300 MG capsule Take 1 capsule (300 mg total) by mouth 2 (two) times daily. (Patient not taking: Reported on 10/16/2015) 14 capsule 0  . fluconazole (DIFLUCAN) 150 MG tablet Take 1 tablet (150 mg total) by mouth every 3 (three) days. (Patient not taking: Reported on 10/16/2015) 2 tablet 0  . metroNIDAZOLE (METROGEL VAGINAL) 0.75 % vaginal gel Place 1 Applicatorful vaginally at bedtime. (Patient not taking: Reported on 10/16/2015) 70 g 0   No current facility-administered medications for this visit.     Review of Systems Review of Systems  Blood pressure 107/72, pulse 92, weight (!) 317 lb 11.2 oz (144.1 kg), last menstrual  period 10/28/2015.  Physical Exam Physical Exam  Data Reviewed Pap result 06/20/15  Assessment    Procedure Details  The risks and benefits of the procedure and Written informed consent obtained.  Speculum placed in vagina and excellent visualization of cervix achieved, cervix swabbed x 3 with acetic acid solution. AWE ant and posterior, 1 cm into os noted, entire lesion seen, SCJ seen , adequate colposcopy Specimens: 12 and 6 o'clock and ECC Monsel's applied Complications: none.     Plan    Specimens labelled and sent to Pathology. Return to discuss Pathology results in 2 weeks.      ARNOLD,JAMES 11/05/2015, 4:05 PM 1. Cervix, biopsy, 12 and 6 - HIGH GRADE SQUAMOUS  INTRAEPITHELIAL LESION, CIN-III (SEVERE DYSPLASIA/CIS). - SEE COMMENT. 2. Endocervix, curettage - HIGH GRADE SQUAMOUS INTRAEPITHELIAL LESION, CIN-III (SEVERE DYSPLASIA/CIS). - SEE COMMENT. Microscopic Comment 1. , 2. The findings correlate with the previous Pap test results [phone]). (JBK:ecj 11/07/2015) Tracy Cutter MD Pathologist, Electronic Signature (Case signed 11/07/2015) Specimen Gross and Clinical Information Specimen(s) Obtained: 1. Cervix, biopsy, 12 and 6 2. Endocervix, curettage Specimen Clinical Biopsy showed severe dysplasia Patient identified, informed consent obtained, signed copy in chart, time out performed.  Pap smear and colposcopy reviewed.   Pap HSIL Colpo Biopsy CIN 3 ECC CIN 3 Teflon coated speculum with smoke evacuator placed.  Cervix visualized. Paracervical block placed.  Medium fisher loop used to remove cone of cervix using blend of cut and cautery on LEEP machine.  Edges/Base cauterized with Ball.  Monsel's solution used for hemostasis.  Patient tolerated procedure well.  Patient given post procedure instructions.  Follow up in 6-12  months for repeat pap or as needed.  Tracy Mode, MD 02/24/2016

## 2016-03-16 ENCOUNTER — Other Ambulatory Visit: Payer: Self-pay | Admitting: Family Medicine

## 2016-03-16 DIAGNOSIS — Z720 Tobacco use: Secondary | ICD-10-CM

## 2016-03-16 MED FILL — METOPROLOL TARTRATE 25 MG T: 25 | 30 days supply | Qty: 60 | Fill #5

## 2016-03-16 MED FILL — ?CHLORTHALIDONE 25 MG TABLE: 25 | 30 days supply | Qty: 30 | Fill #3

## 2016-03-16 MED FILL — BUPROPION SR 150 MG TABLET: 150 | 30 days supply | Qty: 60 | Fill #0

## 2016-03-16 MED FILL — GABAPENTIN 300 MG CAPSULE: 300 | 30 days supply | Qty: 120 | Fill #2

## 2016-03-16 MED FILL — ?LISINOPRIL 10 MG TABLET: 10 | 30 days supply | Qty: 30 | Fill #3

## 2016-03-19 ENCOUNTER — Telehealth: Payer: Self-pay

## 2016-03-19 NOTE — Telephone Encounter (Signed)
Per Dr. Roselie Awkward, pt needs a pap smear in 6 months to f/u LEEP.  Notified pt of provider's recommendation.  Pt stated understanding with no further questions.

## 2016-04-14 ENCOUNTER — Other Ambulatory Visit: Payer: Self-pay | Admitting: Family Medicine

## 2016-04-14 DIAGNOSIS — Z720 Tobacco use: Secondary | ICD-10-CM

## 2016-04-26 MED FILL — LISINOPRIL 10 MG TABLET: 10 | 30 days supply | Qty: 30 | Fill #4

## 2016-04-26 MED FILL — ?CHLORTHALIDONE 25 MG TABLE: 25 | 30 days supply | Qty: 30 | Fill #4

## 2016-04-26 MED FILL — GABAPENTIN 300 MG CAPSULE: 300 | 30 days supply | Qty: 120 | Fill #3

## 2016-04-26 MED FILL — ?BUPROPION HCL SR 150 MG TA: 150 | 30 days supply | Qty: 60 | Fill #0

## 2016-04-26 MED FILL — METOPROLOL TARTRATE 25 MG T: 25 | 30 days supply | Qty: 60 | Fill #6

## 2016-06-01 ENCOUNTER — Other Ambulatory Visit: Payer: Self-pay | Admitting: Family Medicine

## 2016-06-01 DIAGNOSIS — Z720 Tobacco use: Secondary | ICD-10-CM

## 2016-06-01 DIAGNOSIS — R200 Anesthesia of skin: Secondary | ICD-10-CM

## 2016-06-01 MED FILL — ?CHLORTHALIDONE 25 MG TABLE: 25 | 30 days supply | Qty: 30 | Fill #5

## 2016-06-01 MED FILL — VENTOLIN HFA 90 MCG INHALER: 108 (90 BAS | 16 days supply | Qty: 18 | Fill #1

## 2016-06-01 MED FILL — ?LISINOPRIL 10 MG TABLET: 10 | 30 days supply | Qty: 30 | Fill #5

## 2016-06-01 MED FILL — METOPROLOL TARTRATE 25 MG T: 25 | 30 days supply | Qty: 60 | Fill #7

## 2016-06-02 MED FILL — GABAPENTIN 300 MG CAPSULE: 300 | 30 days supply | Qty: 120 | Fill #0

## 2016-06-02 MED FILL — BUPROPION SR 150 MG TABLET: 150 | 30 days supply | Qty: 60 | Fill #0

## 2016-06-25 ENCOUNTER — Encounter (HOSPITAL_COMMUNITY): Payer: Self-pay | Admitting: Emergency Medicine

## 2016-06-25 ENCOUNTER — Emergency Department (HOSPITAL_COMMUNITY)
Admission: EM | Admit: 2016-06-25 | Discharge: 2016-06-26 | Disposition: A | Discharge status: Home / Self Care | Payer: Medicaid Other | Attending: Emergency Medicine | Admitting: Emergency Medicine

## 2016-06-25 DIAGNOSIS — Z79899 Other long term (current) drug therapy: Secondary | ICD-10-CM | POA: Insufficient documentation

## 2016-06-25 DIAGNOSIS — I5030 Unspecified diastolic (congestive) heart failure: Secondary | ICD-10-CM | POA: Insufficient documentation

## 2016-06-25 DIAGNOSIS — R109 Unspecified abdominal pain: Secondary | ICD-10-CM

## 2016-06-25 DIAGNOSIS — Z7982 Long term (current) use of aspirin: Secondary | ICD-10-CM | POA: Insufficient documentation

## 2016-06-25 DIAGNOSIS — R1030 Lower abdominal pain, unspecified: Secondary | ICD-10-CM | POA: Insufficient documentation

## 2016-06-25 DIAGNOSIS — F17210 Nicotine dependence, cigarettes, uncomplicated: Secondary | ICD-10-CM | POA: Insufficient documentation

## 2016-06-25 DIAGNOSIS — I110 Hypertensive heart disease with heart failure: Secondary | ICD-10-CM | POA: Insufficient documentation

## 2016-06-25 DIAGNOSIS — E119 Type 2 diabetes mellitus without complications: Secondary | ICD-10-CM | POA: Insufficient documentation

## 2016-06-25 DIAGNOSIS — J45909 Unspecified asthma, uncomplicated: Secondary | ICD-10-CM | POA: Insufficient documentation

## 2016-06-25 LAB — URINALYSIS, ROUTINE W REFLEX MICROSCOPIC
BILIRUBIN URINE: NEGATIVE
GLUCOSE, UA: NEGATIVE mg/dL
HGB URINE DIPSTICK: NEGATIVE
Ketones, ur: NEGATIVE mg/dL
Leukocytes, UA: NEGATIVE
Nitrite: NEGATIVE
PH: 6 (ref 5.0–8.0)
Protein, ur: NEGATIVE mg/dL
SPECIFIC GRAVITY, URINE: 1.004 — AB (ref 1.005–1.030)

## 2016-06-25 LAB — COMPREHENSIVE METABOLIC PANEL
ALBUMIN: 3.7 g/dL (ref 3.5–5.0)
ALK PHOS: 88 U/L (ref 38–126)
ALT: 32 U/L (ref 14–54)
ANION GAP: 10 (ref 5–15)
AST: 27 U/L (ref 15–41)
BUN: 13 mg/dL (ref 6–20)
CALCIUM: 10 mg/dL (ref 8.9–10.3)
CHLORIDE: 102 mmol/L (ref 101–111)
CO2: 26 mmol/L (ref 22–32)
Creatinine, Ser: 1.29 mg/dL — ABNORMAL HIGH (ref 0.44–1.00)
GFR calc non Af Amer: 47 mL/min — ABNORMAL LOW (ref >60)
GFR, EST AFRICAN AMERICAN: 55 mL/min — AB (ref >60)
GLUCOSE: 239 mg/dL — AB (ref 65–99)
Potassium: 4.4 mmol/L (ref 3.5–5.1)
SODIUM: 138 mmol/L (ref 135–145)
Total Bilirubin: 0.5 mg/dL (ref 0.3–1.2)
Total Protein: 6.7 g/dL (ref 6.5–8.1)

## 2016-06-25 LAB — I-STAT BETA HCG BLOOD, ED (MC, WL, AP ONLY)

## 2016-06-25 LAB — CBC
HEMATOCRIT: 43.8 % (ref 36.0–46.0)
HEMOGLOBIN: 14.6 g/dL (ref 12.0–15.0)
MCH: 33 pg (ref 26.0–34.0)
MCHC: 33.3 g/dL (ref 30.0–36.0)
MCV: 98.9 fL (ref 78.0–100.0)
Platelets: 267 10*3/uL (ref 150–400)
RBC: 4.43 MIL/uL (ref 3.87–5.11)
RDW: 13 % (ref 11.5–15.5)
WBC: 10.5 10*3/uL (ref 4.0–10.5)

## 2016-06-25 LAB — LIPASE, BLOOD: Lipase: 17 U/L (ref 11–51)

## 2016-06-25 NOTE — ED Triage Notes (Addendum)
Patient here from home with cramping.  She states that she feels like she is in labor.  She states that she is feeling something 'kick' in her abdomen.  She is perimenopausal and has not had a period since 11/23/2015.  No vomiting. She states that the pain builds like contractions.  She has not had any positive pregnancy tests, she was seen by OBGYN 4 months ago.

## 2016-06-26 ENCOUNTER — Emergency Department (HOSPITAL_COMMUNITY): Payer: Medicaid Other

## 2016-06-26 MED ORDER — HYOSCYAMINE SULFATE 0.125 MG PO TABS
0.1250 mg | ORAL_TABLET | Freq: Once | ORAL | Status: AC
  Administered 2016-06-26: 0.125 mg via ORAL
  Filled 2016-06-26: qty 1

## 2016-06-26 MED ORDER — HYOSCYAMINE SULFATE SL 0.125 MG SL SUBL: 0.1250 mg | SUBLINGUAL_TABLET | SUBLINGUAL | 0 refills | Status: DC | PRN

## 2016-06-26 NOTE — ED Provider Notes (Signed)
[facility] DEPT Provider Note   CSN: [id number] Arrival date & time: 06/25/16  2227     History   Chief Complaint Chief Complaint  Patient presents with  . Abdominal Pain    HPI Tracy Fitzgerald is a 51 y.o. female.  Patient presents with complaint of abdominal cramping that is sharp, episodic and affects the lower abdomen and pelvis. She feels it is the same pain she experienced with labor. Last menses 11/2105. No vaginal bleeding, discharge or dysuria. No change in bowel movements. No nausea, vomiting or fever. She denies known alleviating or aggravating factors. Symptoms started earlier today.    The history is provided by the patient. No language interpreter was used.  Abdominal Pain   Pertinent negatives include fever, diarrhea, nausea, vomiting and dysuria.    Past Medical History:  Diagnosis Date  . Asthma   . CHF (congestive heart failure) ([place])   . Depression   . Headache   . Hypertension   . Pain disorder     Patient Active Problem List   Diagnosis Date Noted  . New onset type 2 diabetes mellitus ([place]) 11/25/2015  . HGSIL on cytologic smear of cervix 06/25/2015  . Pap smear of cervix shows high risk HPV present 06/25/2015  . Hemorrhoid 06/20/2015  . Perimenopausal 06/20/2015  . Osteoarthritis of spine with radiculopathy, lumbar region 05/29/2015  . Primary osteoarthritis of left hip 05/29/2015  . Vitamin D insufficiency 11/13/2014  . Diastolic CHF ([place]) 81/82/9937  . Shortness of breath 11/12/2014  . Numbness of foot 11/12/2014  . OSA (obstructive sleep apnea) 11/12/2014  . Hip pain 06/24/2014  . Morbid obesity ([place]) 06/19/2014  . Back pain   . Tobacco abuse 06/17/2014  . Atypical chest pain 06/17/2014  . Essential hypertension 06/17/2014    Past Surgical History:  Procedure Laterality Date  . ADENOIDECTOMY    . TONSILLECTOMY      OB History    Gravida Para Term Preterm AB Living   6 6 6     7    SAB TAB Ectopic Multiple Live Births    1 7       Home Medications    Prior to Admission medications   Medication Sig Start Date End Date Taking? Authorizing Provider  acetaminophen-codeine (TYLENOL #3) 300-30 MG tablet Take 1 tablet by mouth every 8 (eight) hours as needed for moderate pain. Patient not taking: Reported on 02/24/2016 11/25/15   Boykin Nearing, MD  aspirin 81 MG EC tablet Take 1 tablet (81 mg total) by mouth daily. 11/25/15   Boykin Nearing, MD  Aspirin-Caffeine 845-65 MG PACK Take by mouth.    Historical Provider, MD  buPROPion (WELLBUTRIN SR) 150 MG 12 hr tablet TAKE 1 TABLET BY MOUTH 2 TIMES DAILY. 06/01/16   Josalyn Funches, MD  chlorthalidone (HYGROTON) 25 MG tablet Take 1 tablet (25 mg total) by mouth every morning. 11/25/15   Josalyn Funches, MD  gabapentin (NEURONTIN) 300 MG capsule TAKE 1 CAPSULE BY MOUTH 4 TIMES DAILY. 06/01/16   Josalyn Funches, MD  ibuprofen (ADVIL,MOTRIN) 800 MG tablet Take 1 tablet (800 mg total) by mouth every 8 (eight) hours as needed for moderate pain. 11/25/15   Josalyn Funches, MD  lisinopril (PRINIVIL,ZESTRIL) 10 MG tablet Take 1 tablet (10 mg total) by mouth daily. 11/25/15   Josalyn Funches, MD  metoprolol tartrate (LOPRESSOR) 25 MG tablet Take 1 tablet (25 mg total) by mouth 2 (two) times daily. 11/25/15   Boykin Nearing, MD  VENTOLIN HFA 108 (90  Base) MCG/ACT inhaler INHALE 2 PUFFS INTO THE LUNGS EVERY 4 HOURS AS NEEDED FOR WHEEZING OR SHORTNESS OF BREATH. FOR SHORTNESS OF BREATH 02/11/16   Boykin Nearing, MD    Family History Family History  Problem Relation Age of Onset  . Hypertension Mother   . Diabetes Mother   . Uterine cancer Mother   . Heart disease Mother   . Cancer Mother     UTERINE CANCER   . Hypertension Father   . Kidney disease Father     Social History Social History  Substance Use Topics  . Smoking status: Current Every Day Smoker    Packs/day: 0.25    Years: 33.00    Types: Cigarettes  . Smokeless tobacco: Never Used  . Alcohol use Yes      Comment: occasionally     Allergies   Patient has no known allergies.   Review of Systems Review of Systems  Constitutional: Negative for chills and fever.  HENT: Negative.   Respiratory: Negative.   Cardiovascular: Negative.   Gastrointestinal: Positive for abdominal pain. Negative for diarrhea, nausea and vomiting.  Genitourinary: Negative.  Negative for dysuria, vaginal bleeding and vaginal discharge.       She states she has pelvic pain like the abdominal cramp extends to "the cervix".  Musculoskeletal: Negative.  Negative for back pain.  Skin: Negative.   Neurological: Negative.      Physical Exam Updated Vital Signs BP 135/86 (BP Location: Left Arm)   Pulse 95   Temp 98.1 F (36.7 C) (Oral)   Resp (!) 22   Ht 5\' 6"  (1.676 m)   Wt (!) 149.7 kg   SpO2 98%   BMI 53.26 kg/m   Physical Exam  Constitutional: She is oriented to person, place, and time. She appears well-developed and well-nourished.  HENT:  Head: Normocephalic.  Neck: Normal range of motion. Neck supple.  Cardiovascular: Normal rate and regular rhythm.   Pulmonary/Chest: Effort normal and breath sounds normal.  Abdominal: Soft. Bowel sounds are normal. There is no tenderness. There is no rebound and no guarding.  Genitourinary: Vagina normal.  Genitourinary Comments: Exam limited by body habitus. Cervix appears unremarkable, without redness, discharge, friability. No pelvic tenderness on bimanual, no CMT.  Musculoskeletal: Normal range of motion.  Neurological: She is alert and oriented to person, place, and time.  Skin: Skin is warm and dry. No rash noted.  Psychiatric: She has a normal mood and affect.     ED Treatments / Results  Labs (all labs ordered are listed, but only abnormal results are displayed) Labs Reviewed  COMPREHENSIVE METABOLIC PANEL - Abnormal; Notable for the following:       Result Value   Glucose, Bld 239 (*)    Creatinine, Ser 1.29 (*)    GFR calc non Af Amer 47 (*)      GFR calc Af Amer 55 (*)    All other components within normal limits  URINALYSIS, ROUTINE W REFLEX MICROSCOPIC - Abnormal; Notable for the following:    Color, Urine STRAW (*)    Specific Gravity, Urine 1.004 (*)    All other components within normal limits  LIPASE, BLOOD  CBC  I-STAT BETA HCG BLOOD, ED ([facility], [facility], [facility] ONLY)    EKG  EKG Interpretation None       Radiology No results found.  Procedures Procedures (including critical care time)  Medications Ordered in ED Medications  hyoscyamine (LEVSIN, ANASPAZ) tablet 0.125 mg (not administered)  Initial Impression / Assessment and Plan / ED Course  I have reviewed the triage vital signs and the nursing notes.  Pertinent labs & imaging results that were available during my care of the patient were reviewed by me and considered in my medical decision making (see chart for details).     Patient presents with cramping lower abdominal pain she feels is the same as labor pain. Labs, including pregnancy are essentially negative. Plain abdominal film negative - normal BGP, minimal stool. No pelvic tenderness.   She reports she feels some better after Levsin. She can be discharged home with Rx levsin and is encouraged to see her PCP if symptoms persist.  Final Clinical Impressions(s) / ED Diagnoses   Final diagnoses:  Abdominal pain    New Prescriptions New Prescriptions   No medications on file     Charlann Lange, PA-C 06/26/16 [place], MD 06/26/16 [id number]

## 2016-06-26 NOTE — ED Notes (Signed)
Patient transported to X-ray 

## 2016-06-26 NOTE — ED Notes (Signed)
Pelvic cart at the bedside 

## 2016-06-30 MED FILL — LISINOPRIL 10 MG TABLET: 10 | 30 days supply | Qty: 30 | Fill #6

## 2016-06-30 MED FILL — METOPROLOL TARTRATE 25 MG T: 25 | 30 days supply | Qty: 60 | Fill #8

## 2016-06-30 MED FILL — CHLORTHALIDONE 25 MG TABLET: 25 | 30 days supply | Qty: 30 | Fill #6

## 2016-07-07 ENCOUNTER — Other Ambulatory Visit: Payer: Self-pay | Admitting: Family Medicine

## 2016-07-07 DIAGNOSIS — Z720 Tobacco use: Secondary | ICD-10-CM

## 2016-07-07 DIAGNOSIS — R200 Anesthesia of skin: Secondary | ICD-10-CM

## 2016-07-07 MED ORDER — BUPROPION HCL ER (SR) 150 MG PO TB12: 150.0000 mg | ORAL_TABLET | Freq: Two times a day (BID) | ORAL | 0 refills | Status: DC

## 2016-07-07 MED ORDER — GABAPENTIN 300 MG PO CAPS: ORAL_CAPSULE | ORAL | 0 refills | Status: DC

## 2016-07-07 MED FILL — HYOSCYAMINE 0.125 MG TAB SL: 0.125 | 6 days supply | Qty: 40 | Fill #0

## 2016-07-07 NOTE — Telephone Encounter (Signed)
PT called to request a refill for the following med. gabapentin (NEURONTIN) 300 MG capsule  buPROPion (WELLBUTRIN SR) 150 MG 12 hr tablet  PT need this refill since she can't get an appt with the pcp and shwe has to call to try to schedule an appt on 07/19/16. Please follow up with PT

## 2016-07-07 NOTE — Telephone Encounter (Signed)
Refilled requested medications to last until appointment with Dr. Adrian Blackwater

## 2016-07-07 NOTE — Telephone Encounter (Signed)
Please call about her med please she need the refill since she can't get an appt before 07/19/16 since is not appt available  She need  buPROPion (WELLBUTRIN SR) 150 MG 12 hr tablet gabapentin (NEURONTIN) 300 MG capsule   Follow up with PT Thanks

## 2016-07-08 MED FILL — GABAPENTIN 300 MG CAPSULE: 300 | 30 days supply | Qty: 120 | Fill #0

## 2016-07-08 MED FILL — BUPROPION SR 150 MG TABLET: 150 | 30 days supply | Qty: 60 | Fill #0

## 2016-07-28 ENCOUNTER — Encounter: Payer: Self-pay | Admitting: Family Medicine

## 2016-08-02 ENCOUNTER — Other Ambulatory Visit: Payer: Self-pay | Admitting: Family Medicine

## 2016-08-02 DIAGNOSIS — Z720 Tobacco use: Secondary | ICD-10-CM

## 2016-08-02 DIAGNOSIS — R200 Anesthesia of skin: Secondary | ICD-10-CM

## 2016-08-02 MED FILL — LISINOPRIL 10 MG TABLET: 10 | 30 days supply | Qty: 30 | Fill #7

## 2016-08-02 MED FILL — CHLORTHALIDONE 25 MG TABLET: 25 | 30 days supply | Qty: 30 | Fill #7

## 2016-08-02 MED FILL — METOPROLOL TARTRATE 25 MG T: 25 | 30 days supply | Qty: 60 | Fill #0

## 2016-08-02 MED FILL — VENTOLIN HFA 90 MCG INHALER: 108 (90 BAS | 16 days supply | Qty: 18 | Fill #2

## 2016-08-04 MED FILL — BUPROPION SR 150 MG TABLET: 150 | 30 days supply | Qty: 60 | Fill #0

## 2016-08-04 MED FILL — GABAPENTIN 300 MG CAPSULE: 300 | 30 days supply | Qty: 120 | Fill #0

## 2016-08-12 ENCOUNTER — Encounter: Payer: Self-pay | Admitting: Internal Medicine

## 2016-08-12 ENCOUNTER — Ambulatory Visit: Payer: Self-pay | Attending: Internal Medicine | Admitting: Internal Medicine

## 2016-08-12 ENCOUNTER — Ambulatory Visit: Payer: Self-pay | Attending: Internal Medicine | Admitting: Licensed Clinical Social Worker

## 2016-08-12 VITALS — BP 132/84 | HR 88 | Temp 97.7°F | Resp 18 | Ht 66.0 in | Wt 316.2 lb

## 2016-08-12 DIAGNOSIS — Z6841 Body Mass Index (BMI) 40.0 and over, adult: Secondary | ICD-10-CM | POA: Insufficient documentation

## 2016-08-12 DIAGNOSIS — F419 Anxiety disorder, unspecified: Secondary | ICD-10-CM | POA: Insufficient documentation

## 2016-08-12 DIAGNOSIS — Z7982 Long term (current) use of aspirin: Secondary | ICD-10-CM | POA: Insufficient documentation

## 2016-08-12 DIAGNOSIS — F17210 Nicotine dependence, cigarettes, uncomplicated: Secondary | ICD-10-CM | POA: Insufficient documentation

## 2016-08-12 DIAGNOSIS — Z8742 Personal history of other diseases of the female genital tract: Secondary | ICD-10-CM

## 2016-08-12 DIAGNOSIS — I10 Essential (primary) hypertension: Secondary | ICD-10-CM | POA: Insufficient documentation

## 2016-08-12 DIAGNOSIS — Z79899 Other long term (current) drug therapy: Secondary | ICD-10-CM | POA: Insufficient documentation

## 2016-08-12 DIAGNOSIS — E1165 Type 2 diabetes mellitus with hyperglycemia: Secondary | ICD-10-CM | POA: Insufficient documentation

## 2016-08-12 DIAGNOSIS — M545 Low back pain: Secondary | ICD-10-CM | POA: Insufficient documentation

## 2016-08-12 DIAGNOSIS — G8929 Other chronic pain: Secondary | ICD-10-CM | POA: Insufficient documentation

## 2016-08-12 DIAGNOSIS — IMO0001 Reserved for inherently not codable concepts without codable children: Secondary | ICD-10-CM

## 2016-08-12 DIAGNOSIS — Z7984 Long term (current) use of oral hypoglycemic drugs: Secondary | ICD-10-CM | POA: Insufficient documentation

## 2016-08-12 DIAGNOSIS — E1149 Type 2 diabetes mellitus with other diabetic neurological complication: Secondary | ICD-10-CM

## 2016-08-12 DIAGNOSIS — M5410 Radiculopathy, site unspecified: Secondary | ICD-10-CM

## 2016-08-12 DIAGNOSIS — Z87898 Personal history of other specified conditions: Secondary | ICD-10-CM

## 2016-08-12 DIAGNOSIS — E559 Vitamin D deficiency, unspecified: Secondary | ICD-10-CM | POA: Insufficient documentation

## 2016-08-12 DIAGNOSIS — F329 Major depressive disorder, single episode, unspecified: Secondary | ICD-10-CM | POA: Insufficient documentation

## 2016-08-12 DIAGNOSIS — M1612 Unilateral primary osteoarthritis, left hip: Secondary | ICD-10-CM | POA: Insufficient documentation

## 2016-08-12 DIAGNOSIS — G4733 Obstructive sleep apnea (adult) (pediatric): Secondary | ICD-10-CM | POA: Insufficient documentation

## 2016-08-12 DIAGNOSIS — Z720 Tobacco use: Secondary | ICD-10-CM

## 2016-08-12 DIAGNOSIS — R87810 Cervical high risk human papillomavirus (HPV) DNA test positive: Secondary | ICD-10-CM | POA: Insufficient documentation

## 2016-08-12 LAB — POCT GLYCOSYLATED HEMOGLOBIN (HGB A1C): HEMOGLOBIN A1C: 7.3

## 2016-08-12 LAB — GLUCOSE, POCT (MANUAL RESULT ENTRY): POC GLUCOSE: 158 mg/dL — AB (ref 70–99)

## 2016-08-12 MED ORDER — DULOXETINE HCL 30 MG PO CPEP
30.0000 mg | ORAL_CAPSULE | Freq: Every day | ORAL | 3 refills | Status: DC
Start: 2016-08-12 — End: 2016-10-12

## 2016-08-12 MED ORDER — METFORMIN HCL 500 MG PO TABS: 500.0000 mg | ORAL_TABLET | Freq: Two times a day (BID) | ORAL | 3 refills | Status: DC

## 2016-08-12 MED ORDER — GABAPENTIN 300 MG PO CAPS: 600.0000 mg | ORAL_CAPSULE | Freq: Four times a day (QID) | ORAL | 6 refills | Status: DC

## 2016-08-12 MED FILL — metFORMIN HCL 500 MG TABS: 500 | 30 days supply | Qty: 60 | Fill #0

## 2016-08-12 MED FILL — ?DULOXETINE HCL DR 30 MG CA: 30 MG | 30 days supply | Qty: 30 | Fill #0

## 2016-08-12 MED FILL — GABAPENTIN 300 MG CAPSULE: 300 | 15 days supply | Qty: 120 | Fill #0

## 2016-08-12 NOTE — Progress Notes (Signed)
Patient ID: Tracy Fitzgerald, female    DOB: Jun 15, 1965  MRN: [id number]  CC: follow up chronic back pain  Subjective:  Tracy Fitzgerald is a 51 y.o. female who presents for chronic ds management. Her concerns today include:  1.  Abnormal PAP -had HSIL on pap last yr.  LEEP 02/2016: HIGH GRADE SQUAMOUS INTRAEPITHELIAL LESION, CIN-III (SEVERE DYSPLASIA/CIS) WITH EXTENSION INTO ENDOCERVICAL GLANDS. - HIGH GRADE SQUAMOUS DYSPLASIA IS FOCALLY PRESENT AT THE ENDOCERVICAL MARGIN IN THE 9-12 O'CL[place]k -told to f/u in 6-12 mths for repeat pap  2.  DM -endorses polydipsia and fatigue -not on any meds -not getting in any exercise -Diet:  Weakness is meats and fats but does love veggies and  fruits Creat slightly elevated in ER last mth  3.  Chronic LBP: was seeing pain specialist but loss to f/u due to lack of insurance/loss orange card -she was seeing Dr Wyvonnia Lora.  -daily pain. "Just miserable.  I get relief setting.  Its the standing that's the worse and then the walking." Can only stand to do dishes for about 5 mins. Pain radiates down LT leg -tingling better in feet with Neurontin -c/o pain all over her body -"would like to get back to work so I can get some insurance."  Was working at [employer] but could not stand at [employer]. -depressed about limited mobility and wgh gain because of it  4.  HTN: Compliant with meds but not salt restriction  5. Depression: Currently on Wellbutrin to help decreasing craving for cigarettes and also for depression.  She feels depression not well controlled. Also gets anxious about things  6.  Tobacco: Wellbutrin helped a little but still smoking about 7-10 cig/day.  "It's my comfort thing."   Patient Active Problem List   Diagnosis Date Noted  . Radicular low back pain 08/12/2016  . Anxiety and depression 08/12/2016  . Class 3 severe obesity due to excess calories with serious comorbidity and body mass index (BMI) of 50.0 to 59.9 in adult ([place])  08/12/2016  . New onset type 2 diabetes mellitus ([place]) 11/25/2015  . HGSIL on cytologic smear of cervix 06/25/2015  . Pap smear of cervix shows high risk HPV present 06/25/2015  . Hemorrhoid 06/20/2015  . Perimenopausal 06/20/2015  . Osteoarthritis of spine with radiculopathy, lumbar region 05/29/2015  . Primary osteoarthritis of left hip 05/29/2015  . Vitamin D insufficiency 11/13/2014  . Diastolic CHF ([place]) 79/39/0300  . Shortness of breath 11/12/2014  . Numbness of foot 11/12/2014  . OSA (obstructive sleep apnea) 11/12/2014  . Hip pain 06/24/2014  . Morbid obesity ([place]) 06/19/2014  . Back pain   . Tobacco abuse 06/17/2014  . Atypical chest pain 06/17/2014  . Essential hypertension 06/17/2014     Current Outpatient Prescriptions on File Prior to Visit  Medication Sig Dispense Refill  . aspirin 81 MG EC tablet Take 1 tablet (81 mg total) by mouth daily. 90 tablet 3  . Aspirin-Caffeine 845-65 MG PACK Take by mouth.    Marland Kitchen buPROPion (WELLBUTRIN SR) 150 MG 12 hr tablet TAKE 1 TABLET BY MOUTH 2 TIMES DAILY. 60 tablet 0  . chlorthalidone (HYGROTON) 25 MG tablet Take 1 tablet (25 mg total) by mouth every morning. 90 tablet 2  . Hyoscyamine Sulfate SL (LEVSIN/SL) 0.125 MG SUBL Place 0.125 mg under the tongue every 4 (four) hours as needed. 40 each 0  . ibuprofen (ADVIL,MOTRIN) 800 MG tablet Take 1 tablet (800 mg total) by mouth every 8 (eight) hours  as needed for moderate pain. 30 tablet 1  . lisinopril (PRINIVIL,ZESTRIL) 10 MG tablet Take 1 tablet (10 mg total) by mouth daily. 90 tablet 3  . metoprolol tartrate (LOPRESSOR) 25 MG tablet Take 1 tablet (25 mg total) by mouth 2 (two) times daily. 180 tablet 3  . VENTOLIN HFA 108 (90 Base) MCG/ACT inhaler INHALE 2 PUFFS INTO THE LUNGS EVERY 4 HOURS AS NEEDED FOR WHEEZING OR SHORTNESS OF BREATH. FOR SHORTNESS OF BREATH 54 g 0  . acetaminophen-codeine (TYLENOL #3) 300-30 MG tablet Take 1 tablet by mouth every 8 (eight) hours as needed for moderate  pain. (Patient not taking: Reported on 02/24/2016) 90 tablet 0   No current facility-administered medications on file prior to visit.     No Known Allergies   ROS: Review of Systems  Constitutional: Negative for activity change and appetite change.  Eyes: Negative for visual disturbance.  Cardiovascular: Negative for chest pain.  Psychiatric/Behavioral: Positive for dysphoric mood. The patient is nervous/anxious.    PHYSICAL EXAM: BP 132/84 (BP Location: Left Arm, Patient Position: Sitting, Cuff Size: Large)   Pulse 88   Temp 97.7 F (36.5 C) (Oral)   Resp 18   Ht _0  (1.676 m)   Wt (!) 316 lb 3.2 oz (143.4 kg)   SpO2 96%   BMI 51.04 kg/m   Wt Readings from Last 3 Encounters:  08/12/16 (!) 316 lb 3.2 oz (143.4 kg)  06/25/16 (!) 330 lb (149.7 kg)  02/24/16 (!) 303 lb 14.4 oz (137.8 kg)   Physical Exam General appearance -morbidly obese caucasian female in NAD.  She is tearful at times Mental status - alert, oriented to person, place, and time.  Tearful at times Eyes -Pink conjunctiva, PERRLA Neck - supple, no significant adenopathy Chest - clear to auscultation, no wheezes, rales or rhonchi, symmetric air entry Heart - normal rate, regular rhythm, normal S1, S2, no murmurs, rubs, clicks or gallops Musculoskeletal -ambulates with standard cane, speed slow.  No tenderness on palpation of LS spine Extremities - no Le edema.  Results for orders placed or performed in visit on 08/12/16  POCT glucose (manual entry)  Result Value Ref Range   POC Glucose 158 (A) 70 - 99 mg/dl  POCT glycosylated hemoglobin (Hb A1C)  Result Value Ref Range   Hemoglobin A1C 7.3    Depression screen [facility] 2/9 08/12/2016 11/25/2015 11/25/2015 06/20/2015 06/10/2015  Decreased Interest _1 Down, Depressed, Hopeless _2 PHQ - 2 Score _3 Altered sleeping 3 3 - 3 3  Tired, decreased energy 3 3 - 3 3  Change in appetite 3 3 - 3 3  Feeling bad or failure about yourself  2 1 - 1 1    Trouble concentrating 3 3 - 3 3  Moving slowly or fidgety/restless 0 1 - 0 0  Suicidal thoughts 1 0 - 0 0  PHQ-9 Score 21 20 - 19 18   GAD 7 : Generalized Anxiety Score 08/12/2016 11/25/2015 06/20/2015 06/10/2015  Nervous, Anxious, on Edge _4 Control/stop worrying 0 1 0 2  Worry too much - different things 0 1 0 2  Trouble relaxing _5 Restless _6 0  Easily annoyed or irritable 0 _7 Afraid - awful might happen 1 0 0 0  Total GAD 7 Score _8 Anxiety Difficulty Very difficult - - -  ASSESSMENT AND PLAN: 1. DM type 2 uncontrolled Discussed the importance of healthy eating habits, regular aerobic exercise (at least 150 minutes a week as tolerated) and medication compliance to achieve or maintain control of diabetes. -start low dose Metformin.  Recheck creat and eGFR - POCT glucose (manual entry) - POCT glycosylated hemoglobin (Hb A1C) - metFORMIN (GLUCOPHAGE) 500 MG tablet; Take 1 tablet (500 mg total) by mouth 2 (two) times daily with a meal.  Dispense: 180 tablet; Refill: 3 - Ambulatory referral to Ophthalmology - Comprehensive metabolic panel - Lipid panel  2. Radicular low back pain -MRI report from 05/2015 reviewed. I think she may also have component of fibromyalgia -increase Gabapentin. Pt agreeable to adding Cymbalta to help with chronic pain and MH diagnoses - gabapentin (NEURONTIN) 300 MG capsule; Take 2 capsules (600 mg total) by mouth 4 (four) times daily.  Dispense: 120 capsule; Refill: 6 - DULoxetine (CYMBALTA) 30 MG capsule; Take 1 capsule (30 mg total) by mouth daily.  Dispense: 30 capsule; Refill: 3 - Ambulatory referral to Pain Clinic  3. Essential hypertension -at goal DASH diet discussed and encouraged.  4. Tobacco abuse Patient advised to quit smoking. Discussed health risks associated with smoking including lung and other types of cancers, chronic lung diseases and CV risks.. Pt not ready to give trail of quitting but will try to  continue to cut back.    5. Anxiety and depression -have asked LCSW to see her today - DULoxetine (CYMBALTA) 30 MG capsule; Take 1 capsule (30 mg total) by mouth daily.  Dispense: 30 capsule; Refill: 3  6. Class 3 severe obesity due to excess calories with serious comorbidity and body mass index (BMI) of 50.0 to 59.9 in adult [facility]) -encouraged her to try to get in some form of exercise.  Suggested walk/rest cycles of 5 mins.  She is doubtful that she would be able to do anything.  Will discuss getting her to P.T once she gets orange card.  Appt to be made with financial couselor Goals    . Blood Pressure < 140/90    . I will cut back on eating fatty foods.       7. Hx of abnormal cervical Pap smear GYN referral for f/u.  Again she needs to see financial counselor to sign up for [facility] card  Will see her back in 2 mths  Patient was given the opportunity to ask questions.  Patient verbalized understanding of the plan and was able to repeat key elements of the plan.   Orders Placed This Encounter  Procedures  . Comprehensive metabolic panel  . Lipid panel  . Ambulatory referral to Pain Clinic  . Ambulatory referral to Ophthalmology  . POCT glucose (manual entry)  . POCT glycosylated hemoglobin (Hb A1C)     Requested Prescriptions   Signed Prescriptions Disp Refills  . gabapentin (NEURONTIN) 300 MG capsule 120 capsule 6    Sig: Take 2 capsules (600 mg total) by mouth 4 (four) times daily.  . metFORMIN (GLUCOPHAGE) 500 MG tablet 180 tablet 3    Sig: Take 1 tablet (500 mg total) by mouth 2 (two) times daily with a meal.  . DULoxetine (CYMBALTA) 30 MG capsule 30 capsule 3    Sig: Take 1 capsule (30 mg total) by mouth daily.    No future appointments.  Karle Plumber, MD, FACP

## 2016-08-12 NOTE — Assessment & Plan Note (Signed)
Had abnormal LEEP 02/2016.  Plan is for her to f/u in 6-12 mths for repeat pap.

## 2016-08-12 NOTE — Patient Instructions (Addendum)
Please give appointment with Ms. Luciana Axe. Give appointment with Christa See.  Stop at the lab today to have blood tests done.   1.  Increase Gabapentin to 600 mg every 6 hours. 2.  Start Metformin 500 mg daily to help better control diabetes. 3.  Start Cymbalta to help with chronic pain and depression.  Follow a Healthy Eating Plan - You can do it! Limit sugary drinks.  Avoid sodas, sweet tea, sport or energy drinks, or fruit drinks.  Drink water, lo-fat milk, or diet drinks. Limit snack foods.   Cut back on candy, cake, cookies, chips, ice cream.  These are a special treat, only in small amounts. Eat plenty of vegetables.  Especially dark green, red, and orange vegetables. Aim for at least 3 servings a day. More is better! Include fruit in your daily diet.  Whole fruit is much healthier than fruit juice! Limit "white" bread, "white" pasta, "white" rice.   Choose "100% whole grain" products, brown or wild rice. Avoid fatty meats. Try "Meatless Monday" and choose eggs or beans one day a week.  When eating meat, choose lean meats like chicken, [place], and fish.  Grill, broil, or bake meats instead of frying, and eat poultry without the skin. Eat less salt.  Avoid frozen pizzas, frozen dinners and salty foods.  Use seasonings other than salt in cooking.  This can help blood pressure and keep you from swelling Beer, wine and liquor have calories.  If you can safely drink alcohol, limit to 1 drink per day for women, 2 drinks for men

## 2016-08-12 NOTE — Addendum Note (Signed)
Addended by: Karle Plumber B on: 08/12/2016 01:00 PM   Modules accepted: Orders

## 2016-08-12 NOTE — BH Specialist Note (Signed)
Integrated Behavioral Health Initial Visit  MRN: [id number] Name: Tracy Fitzgerald   Session Start time: 11:00 AM Session End time: 11:30 AM Total time: 30 minutes  Type of Service: [place] Interpretor:No. Interpretor Name and Language: N/A   Warm Hand Off Completed.       SUBJECTIVE: Tracy Fitzgerald is a 51 y.o. female accompanied by patient. Patient was referred by Dr. Wynetta Emery for depression and anxiety. Patient reports the following symptoms/concerns: overwhelming feelings of sadness and worry, difficulty sleeping, low energy, weight gain, chronic pain, inability to focus, and hx of suicidal ideations Duration of problem: 1997 "That's when I lost my mother, father, and brother"; Severity of problem: severe  OBJECTIVE: Mood: Anxious and Depressed and Affect: Appropriate Risk of harm to self or others: Suicidal ideation No plan to harm self or others   LIFE CONTEXT: Family and Social: Pt resides with spouse in a [employer]. She has minor children who reside with pt's adult children due to she and spouse's financial instability School/Work: Pt is unemployed due to medical concerns. Spouse receives disability Self-Care: Pt smokes cigarettes. She enjoys cooking, reading, and cooking to cope with mental and physical health Life Changes: Pt is experiencing decline in physical health and financial strain  GOALS ADDRESSED: Patient will reduce symptoms of: anxiety and depression and increase knowledge and/or ability of: coping skills and healthy habits and also: Increase healthy adjustment to current life circumstances and Increase adequate support systems for patient/family   INTERVENTIONS: Solution-Focused Strategies, Supportive Counseling, Psychoeducation and/or Health Education and Link to [employer]  Standardized Assessments completed: PHQ 2&9 with C-SSRS  ASSESSMENT: Patient currently experiencing depression and anxiety triggered  by ongoing medical concerns and financial strain. Patient reports overwhelming feelings of sadness and worry, difficulty sleeping, low energy, weight gain, chronic pain, inability to focus, and hx of suicidal ideations. She has limited support. Patient may benefit from psychoeducation, psychotherapy, and medication management. Pt reports hx of suicidal ideations due to decrease in quality of life because of chronic pain; however, denies plan or intent to harm self or others. LCSWA inquired about protective factors and assisted pt in identifying safety plan with crisis intervention resources. Pt participates in medication management and was strongly encouraged to initiate psychotherapy. LCSWA discussed the importance of implementing healthy coping strategies. Pt was successful in identifying coping skills on a daily basis. LCSWA provided pt with resources on crisis intervention, food insecurity, and therapy.  PLAN: 1. Follow up with behavioral health clinician on : Pt was encouraged to contact LCSWA if symptoms worsen or fail to improve to schedule behavioral appointments at [facility]. 2. Behavioral recommendations: LCSWA recommends that pt apply healthy coping skills discussed, comply with medication management, and utilize provided resources. Pt is encouraged to schedule follow up appointment with LCSWA 3. Referral(s): [employer] (In Clinic), [facility] (LME/Outside Clinic) and Community Resources:  Food 4. "From scale of 1-10, how likely are you to follow plan?": 7/10  Rebekah Chesterfield, LCSW 08/13/16 4:37 PM

## 2016-08-13 ENCOUNTER — Other Ambulatory Visit: Payer: Self-pay | Admitting: Internal Medicine

## 2016-08-13 LAB — COMPREHENSIVE METABOLIC PANEL
ALK PHOS: 98 IU/L (ref 39–117)
ALT: 44 IU/L — ABNORMAL HIGH (ref 0–32)
AST: 24 IU/L (ref 0–40)
Albumin/Globulin Ratio: 1.6 (ref 1.2–2.2)
Albumin: 4.1 g/dL (ref 3.5–5.5)
BUN/Creatinine Ratio: 18 (ref 9–23)
BUN: 20 mg/dL (ref 6–24)
Bilirubin Total: 0.4 mg/dL (ref 0.0–1.2)
CALCIUM: 10.2 mg/dL (ref 8.7–10.2)
CO2: 21 mmol/L (ref 18–29)
CREATININE: 1.14 mg/dL — AB (ref 0.57–1.00)
Chloride: 99 mmol/L (ref 96–106)
GFR calc Af Amer: 64 mL/min/{1.73_m2} (ref >59)
GFR, EST NON AFRICAN AMERICAN: 56 mL/min/{1.73_m2} — AB (ref >59)
GLUCOSE: 119 mg/dL — AB (ref 65–99)
Globulin, Total: 2.6 g/dL (ref 1.5–4.5)
Potassium: 4.4 mmol/L (ref 3.5–5.2)
SODIUM: 137 mmol/L (ref 134–144)
Total Protein: 6.7 g/dL (ref 6.0–8.5)

## 2016-08-13 LAB — LIPID PANEL
CHOL/HDL RATIO: 4.1 ratio (ref 0.0–4.4)
CHOLESTEROL TOTAL: 201 mg/dL — AB (ref 100–199)
HDL: 49 mg/dL (ref >39)
LDL CALC: 99 mg/dL (ref 0–99)
TRIGLYCERIDES: 264 mg/dL — AB (ref 0–149)
VLDL Cholesterol Cal: 53 mg/dL — ABNORMAL HIGH (ref 5–40)

## 2016-08-13 MED ORDER — ATORVASTATIN CALCIUM 10 MG PO TABS: 10.0000 mg | ORAL_TABLET | Freq: Every day | ORAL | 0 refills | Status: DC

## 2016-08-13 MED FILL — ATORVASTATIN 10 MG TABLET: 10 | 30 days supply | Qty: 30 | Fill #0

## 2016-08-13 NOTE — Progress Notes (Signed)
Lipitor prescribed for hyperlipidemia.

## 2016-08-18 ENCOUNTER — Ambulatory Visit: Payer: Self-pay | Attending: Internal Medicine

## 2016-09-07 ENCOUNTER — Other Ambulatory Visit: Payer: Self-pay | Admitting: Family Medicine

## 2016-09-07 DIAGNOSIS — I10 Essential (primary) hypertension: Secondary | ICD-10-CM

## 2016-09-07 DIAGNOSIS — Z720 Tobacco use: Secondary | ICD-10-CM

## 2016-09-07 MED FILL — VENTOLIN HFA 90 MCG INHALER: 108 (90 BAS | 16 days supply | Qty: 18 | Fill #3

## 2016-09-07 MED FILL — metFORMIN HCL 500 MG TABS: 500 | 30 days supply | Qty: 60 | Fill #1

## 2016-09-07 MED FILL — LISINOPRIL 10 MG TABLET: 10 | 30 days supply | Qty: 30 | Fill #8

## 2016-09-07 MED FILL — METOPROLOL TARTRATE 25 MG T: 25 | 30 days supply | Qty: 60 | Fill #1

## 2016-09-07 MED FILL — ATORVASTATIN 10 MG TABLET: 10 | 30 days supply | Qty: 30 | Fill #1

## 2016-09-07 MED FILL — CHLORTHALIDONE 25 MG TABLET: 25 | 30 days supply | Qty: 30 | Fill #8

## 2016-09-07 MED FILL — ?DULOXETINE HCL DR 30 MG CA: 30 MG | 30 days supply | Qty: 30 | Fill #1

## 2016-09-07 MED FILL — BUPROPION SR 150 MG TABLET: 150 | 30 days supply | Qty: 60 | Fill #1

## 2016-09-07 MED FILL — GABAPENTIN 300 MG CAPSULE: 300 | 15 days supply | Qty: 120 | Fill #1

## 2016-09-28 ENCOUNTER — Encounter: Payer: Self-pay | Admitting: Obstetrics & Gynecology

## 2016-10-10 ENCOUNTER — Encounter: Payer: Self-pay | Admitting: Internal Medicine

## 2016-10-10 DIAGNOSIS — E785 Hyperlipidemia, unspecified: Secondary | ICD-10-CM | POA: Insufficient documentation

## 2016-10-10 DIAGNOSIS — E1169 Type 2 diabetes mellitus with other specified complication: Secondary | ICD-10-CM | POA: Insufficient documentation

## 2016-10-11 ENCOUNTER — Other Ambulatory Visit: Payer: Self-pay | Admitting: Family Medicine

## 2016-10-11 DIAGNOSIS — Z720 Tobacco use: Secondary | ICD-10-CM

## 2016-10-11 DIAGNOSIS — I10 Essential (primary) hypertension: Secondary | ICD-10-CM

## 2016-10-11 MED FILL — GABAPENTIN 300 MG CAPSULE: 300 | 15 days supply | Qty: 120 | Fill #2

## 2016-10-11 MED FILL — ?METFORMIN HCL 500MG TABLET: 500 | 30 days supply | Qty: 60 | Fill #2

## 2016-10-11 MED FILL — ?DULOXETINE HCL DR 30 MG CA: 30 MG | 30 days supply | Qty: 30 | Fill #2

## 2016-10-11 MED FILL — METOPROLOL TARTRATE 25 MG T: 25 | 30 days supply | Qty: 60 | Fill #2

## 2016-10-11 MED FILL — LISINOPRIL 10 MG TABLET: 10 | 30 days supply | Qty: 30 | Fill #9

## 2016-10-11 MED FILL — ?ATORVASTATIN 10 MG TABLET: 10 | 30 days supply | Qty: 30 | Fill #2

## 2016-10-12 ENCOUNTER — Encounter: Payer: Self-pay | Admitting: Internal Medicine

## 2016-10-12 ENCOUNTER — Ambulatory Visit: Payer: Self-pay | Attending: Internal Medicine | Admitting: Internal Medicine

## 2016-10-12 VITALS — BP 128/86 | HR 79 | Temp 98.3°F | Resp 16 | Wt 318.6 lb

## 2016-10-12 DIAGNOSIS — F329 Major depressive disorder, single episode, unspecified: Secondary | ICD-10-CM | POA: Insufficient documentation

## 2016-10-12 DIAGNOSIS — Z841 Family history of disorders of kidney and ureter: Secondary | ICD-10-CM | POA: Insufficient documentation

## 2016-10-12 DIAGNOSIS — M1612 Unilateral primary osteoarthritis, left hip: Secondary | ICD-10-CM | POA: Insufficient documentation

## 2016-10-12 DIAGNOSIS — Z8049 Family history of malignant neoplasm of other genital organs: Secondary | ICD-10-CM | POA: Insufficient documentation

## 2016-10-12 DIAGNOSIS — G8929 Other chronic pain: Secondary | ICD-10-CM | POA: Insufficient documentation

## 2016-10-12 DIAGNOSIS — Z6841 Body Mass Index (BMI) 40.0 and over, adult: Secondary | ICD-10-CM | POA: Insufficient documentation

## 2016-10-12 DIAGNOSIS — E119 Type 2 diabetes mellitus without complications: Secondary | ICD-10-CM

## 2016-10-12 DIAGNOSIS — Z720 Tobacco use: Secondary | ICD-10-CM

## 2016-10-12 DIAGNOSIS — E66813 Obesity, class 3: Secondary | ICD-10-CM

## 2016-10-12 DIAGNOSIS — Z833 Family history of diabetes mellitus: Secondary | ICD-10-CM | POA: Insufficient documentation

## 2016-10-12 DIAGNOSIS — Z79899 Other long term (current) drug therapy: Secondary | ICD-10-CM | POA: Insufficient documentation

## 2016-10-12 DIAGNOSIS — Z7982 Long term (current) use of aspirin: Secondary | ICD-10-CM | POA: Insufficient documentation

## 2016-10-12 DIAGNOSIS — Z86001 Personal history of in-situ neoplasm of cervix uteri: Secondary | ICD-10-CM | POA: Insufficient documentation

## 2016-10-12 DIAGNOSIS — K069 Disorder of gingiva and edentulous alveolar ridge, unspecified: Secondary | ICD-10-CM

## 2016-10-12 DIAGNOSIS — R339 Retention of urine, unspecified: Secondary | ICD-10-CM

## 2016-10-12 DIAGNOSIS — R5381 Other malaise: Secondary | ICD-10-CM

## 2016-10-12 DIAGNOSIS — Z1211 Encounter for screening for malignant neoplasm of colon: Secondary | ICD-10-CM

## 2016-10-12 DIAGNOSIS — Z7984 Long term (current) use of oral hypoglycemic drugs: Secondary | ICD-10-CM | POA: Insufficient documentation

## 2016-10-12 DIAGNOSIS — F17210 Nicotine dependence, cigarettes, uncomplicated: Secondary | ICD-10-CM | POA: Insufficient documentation

## 2016-10-12 DIAGNOSIS — M5410 Radiculopathy, site unspecified: Secondary | ICD-10-CM

## 2016-10-12 DIAGNOSIS — G4733 Obstructive sleep apnea (adult) (pediatric): Secondary | ICD-10-CM | POA: Insufficient documentation

## 2016-10-12 DIAGNOSIS — F419 Anxiety disorder, unspecified: Secondary | ICD-10-CM

## 2016-10-12 DIAGNOSIS — R87619 Unspecified abnormal cytological findings in specimens from cervix uteri: Secondary | ICD-10-CM

## 2016-10-12 DIAGNOSIS — E559 Vitamin D deficiency, unspecified: Secondary | ICD-10-CM | POA: Insufficient documentation

## 2016-10-12 DIAGNOSIS — Z8249 Family history of ischemic heart disease and other diseases of the circulatory system: Secondary | ICD-10-CM | POA: Insufficient documentation

## 2016-10-12 DIAGNOSIS — M4726 Other spondylosis with radiculopathy, lumbar region: Secondary | ICD-10-CM | POA: Insufficient documentation

## 2016-10-12 DIAGNOSIS — E785 Hyperlipidemia, unspecified: Secondary | ICD-10-CM

## 2016-10-12 DIAGNOSIS — I10 Essential (primary) hypertension: Secondary | ICD-10-CM | POA: Insufficient documentation

## 2016-10-12 LAB — GLUCOSE, POCT (MANUAL RESULT ENTRY): POC Glucose: 196 mg/dl — AB (ref 70–99)

## 2016-10-12 MED ORDER — GLUCOSE BLOOD VI STRP: ORAL_STRIP | 12 refills | Status: DC

## 2016-10-12 MED ORDER — TRUEPLUS LANCETS 28G MISC: 2 refills | Status: DC

## 2016-10-12 MED ORDER — TRUE METRIX METER W/DEVICE KIT: PACK | 0 refills | Status: DC

## 2016-10-12 MED ORDER — SERTRALINE HCL 50 MG PO TABS: ORAL_TABLET | ORAL | 3 refills | Status: DC

## 2016-10-12 MED FILL — CHLORTHALIDONE 25 MG TABLET: 25 | 30 days supply | Qty: 30 | Fill #0

## 2016-10-12 MED FILL — TRUE METRIX TEST STRIP: 30 days supply | Qty: 100 | Fill #0

## 2016-10-12 MED FILL — BUPROPION SR 150 MG TABLET: 150 | 30 days supply | Qty: 60 | Fill #0

## 2016-10-12 MED FILL — !TRUE METRIX BLOOD GLUCOSE: 1 days supply | Qty: 1 | Fill #0

## 2016-10-12 MED FILL — TRUEplus LANCETS 28G MISC: 30 days supply | Qty: 100 | Fill #0

## 2016-10-12 MED FILL — ?SERTRALINE HCL 50 MG TAB: 50 | 35 days supply | Qty: 30 | Fill #0

## 2016-10-12 NOTE — Patient Instructions (Addendum)
FRONT DESK:  Please give f/u appointment with [facility] for depression/anxiety in 1 mths Discontinue Cymbalta. Start Zoloft 50 mg. Start taking half a tablet daily for 1 week then increase to a full tablet daily.  Continue to work on trying to cut back on smoking.  Try to work on decreasing portion sizes and avoid snacking between meals.  Patient have been counseled extensively about nutrition and exercise. Other issues discussed during this visit include: low cholesterol diet, weight control and daily exercise, foot care, annual eye examinations at Ophthalmology, importance of adherence with medications and regular follow-up. We also discussed long term complications of uncontrolled diabetes and hypertension.

## 2016-10-12 NOTE — Progress Notes (Signed)
Patient ID: Tracy Fitzgerald, female    DOB: 06/10/1965  MRN: [id number]  CC: Follow-up   Subjective: Tracy Fitzgerald is a 51 y.o. female who presents for 2 mth f/u visit for chronic disease management Her concerns today include:  51 year old with history of diabetes 2, tobacco dependence, radicular lower back pain (mod. LT foraminal stenosis and small central disc bulge at T12-L1 on MRI 05/2015), HTN, anxiety/depression, obesity, abnormal Pap, OSA, and new diagnosis of hyperlipidemia. Got a car since last visit. This has really made a difference for her in being able to get around and two appointments  1.Anxiety/depression -Started on Cymbalta on last visit. -Feels no better. -Over eating which she attributes to depression -Noted problems trying to pass her urine within hours of taking Cymbalta and Neurontin. -going to [facility] today to try and establish care.    2.  HL: -started the Lipitor. Tolerating Ok.  3. Abn PAP -has appt with GYN 10/27/2016  4. Chronic Pain -no diff with Cymbalta and inc in Neurontin from last visit -has appt with pain specialist this wk  5. DM: -no meter to check BS. Would like to get one. No bloating or diarrhea with Metformin -Eating habits: "terrible. I think having the car will make a big diff in my life." Tends to over eat when just sitting at home.  Got out and went fishing the other day. "Nice to get out in the fresh air." -[facility] card does not cover eye exam. Can not afford [employer] right now  6. Tob: "If I'm occupied, I don't think about it."   She was not able to cut back from last visit Would like to quit but still not ready to  6. HM: Never had colonoscopy. Request dental referral. Roots expose. A lot of pain.  Request completed for for Handicap sticker  Patient Active Problem List   Diagnosis Date Noted  . Hyperlipidemia, unspecified 10/10/2016  . Radicular low back pain 08/12/2016  . Anxiety and depression 08/12/2016  . Class 3 severe  obesity due to excess calories with serious comorbidity and body mass index (BMI) of 50.0 to 59.9 in adult ([place]) 08/12/2016  . New onset type 2 diabetes mellitus ([place]) 11/25/2015  . Severe dysplasia of cervix (CIN III) 06/25/2015  . Hemorrhoid 06/20/2015  . Perimenopausal 06/20/2015  . Osteoarthritis of spine with radiculopathy, lumbar region 05/29/2015  . Primary osteoarthritis of left hip 05/29/2015  . Vitamin D insufficiency 11/13/2014  . Diastolic CHF ([place]) 46/96/2952  . Numbness of foot 11/12/2014  . OSA (obstructive sleep apnea) 11/12/2014  . Tobacco abuse 06/17/2014  . Essential hypertension 06/17/2014     Current Outpatient Prescriptions on File Prior to Visit  Medication Sig Dispense Refill  . acetaminophen-codeine (TYLENOL #3) 300-30 MG tablet Take 1 tablet by mouth every 8 (eight) hours as needed for moderate pain. (Patient not taking: Reported on 02/24/2016) 90 tablet 0  . aspirin 81 MG EC tablet Take 1 tablet (81 mg total) by mouth daily. 90 tablet 3  . Aspirin-Caffeine 845-65 MG PACK Take by mouth.    Marland Kitchen atorvastatin (LIPITOR) 10 MG tablet Take 1 tablet (10 mg total) by mouth daily. 90 tablet 0  . buPROPion (WELLBUTRIN SR) 150 MG 12 hr tablet TAKE 1 TABLET BY MOUTH 2 TIMES DAILY. 60 tablet 2  . chlorthalidone (HYGROTON) 25 MG tablet TAKE 1 TABLET BY MOUTH EVERY MORNING. 30 tablet 2  . gabapentin (NEURONTIN) 300 MG capsule Take 2 capsules (600 mg total) by mouth  4 (four) times daily. 120 capsule 6  . Hyoscyamine Sulfate SL (LEVSIN/SL) 0.125 MG SUBL Place 0.125 mg under the tongue every 4 (four) hours as needed. 40 each 0  . ibuprofen (ADVIL,MOTRIN) 800 MG tablet Take 1 tablet (800 mg total) by mouth every 8 (eight) hours as needed for moderate pain. 30 tablet 1  . lisinopril (PRINIVIL,ZESTRIL) 10 MG tablet Take 1 tablet (10 mg total) by mouth daily. 90 tablet 3  . metFORMIN (GLUCOPHAGE) 500 MG tablet Take 1 tablet (500 mg total) by mouth 2 (two) times daily with a meal. 180  tablet 3  . metoprolol tartrate (LOPRESSOR) 25 MG tablet Take 1 tablet (25 mg total) by mouth 2 (two) times daily. 180 tablet 3  . VENTOLIN HFA 108 (90 Base) MCG/ACT inhaler INHALE 2 PUFFS INTO THE LUNGS EVERY 4 HOURS AS NEEDED FOR WHEEZING OR SHORTNESS OF BREATH. FOR SHORTNESS OF BREATH 54 g 0   No current facility-administered medications on file prior to visit.     No Known Allergies  Social History   Social History  . Marital status: Married    Spouse name: N/A  . Number of children: N/A  . Years of education: N/A   Occupational History  . Not on file.   Social History Main Topics  . Smoking status: Current Every Day Smoker    Packs/day: 0.25    Years: 33.00    Types: Cigarettes  . Smokeless tobacco: Current User  . Alcohol use Yes     Comment: occasionally  . Drug use: No  . Sexual activity: Yes    Partners: Male    Birth control/ protection: Post-menopausal   Other Topics Concern  . Not on file   Social History Narrative  . No narrative on file    Family History  Problem Relation Age of Onset  . Hypertension Mother   . Diabetes Mother   . Uterine cancer Mother   . Heart disease Mother   . Cancer Mother        UTERINE CANCER   . Hypertension Father   . Kidney disease Father     Past Surgical History:  Procedure Laterality Date  . ADENOIDECTOMY    . TONSILLECTOMY      ROS: Review of Systems Negative except as stated above  PHYSICAL EXAM: BP 128/86   Pulse 79   Temp 98.3 F (36.8 C) (Oral)   Resp 16   Wt (!) 318 lb 9.6 oz (144.5 kg)   SpO2 97%   BMI 51.42 kg/m   Wt Readings from Last 3 Encounters:  10/12/16 (!) 318 lb 9.6 oz (144.5 kg)  08/12/16 (!) 316 lb 3.2 oz (143.4 kg)  06/25/16 (!) 330 lb (149.7 kg)   Physical Exam General appearance -morbidly obese caucasian female in NAD.  Mental status - alert, oriented to person, place, and time.   Eyes -Pink conjunctiva, PERRLA Neck - supple, no significant adenopathy Chest - clear to  auscultation, no wheezes, rales or rhonchi, symmetric air entry Heart - normal rate, regular rhythm, normal S1, S2, no murmurs, rubs, clicks or gallops Musculoskeletal -ambulates with standard cane, speed slow.  No tenderness on palpation of LS spine Extremities - no Le edema.   Depression screen [facility] 2/9 10/12/2016  Decreased Interest 3  Down, Depressed, Hopeless 3  PHQ - 2 Score 6  Altered sleeping 3  Tired, decreased energy 3  Change in appetite 3  Feeling bad or failure about yourself  3  Trouble concentrating  3  Moving slowly or fidgety/restless 1  Suicidal thoughts 0  PHQ-9 Score 22   Results for orders placed or performed in visit on 08/12/16  Comprehensive metabolic panel  Result Value Ref Range   Glucose 119 (H) 65 - 99 mg/dL   BUN 20 6 - 24 mg/dL   Creatinine, Ser 1.14 (H) 0.57 - 1.00 mg/dL   GFR calc non Af Amer 56 (L) >59 mL/min/1.73   GFR calc Af Amer 64 >59 mL/min/1.73   BUN/Creatinine Ratio 18 9 - 23   Sodium 137 134 - 144 mmol/L   Potassium 4.4 3.5 - 5.2 mmol/L   Chloride 99 96 - 106 mmol/L   CO2 21 18 - 29 mmol/L   Calcium 10.2 8.7 - 10.2 mg/dL   Total Protein 6.7 6.0 - 8.5 g/dL   Albumin 4.1 3.5 - 5.5 g/dL   Globulin, Total 2.6 1.5 - 4.5 g/dL   Albumin/Globulin Ratio 1.6 1.2 - 2.2   Bilirubin Total 0.4 0.0 - 1.2 mg/dL   Alkaline Phosphatase 98 39 - 117 IU/L   AST 24 0 - 40 IU/L   ALT 44 (H) 0 - 32 IU/L  Lipid panel  Result Value Ref Range   Cholesterol, Total 201 (H) 100 - 199 mg/dL   Triglycerides 264 (H) 0 - 149 mg/dL   HDL 49 >39 mg/dL   VLDL Cholesterol Cal 53 (H) 5 - 40 mg/dL   LDL Calculated 99 0 - 99 mg/dL   Chol/HDL Ratio 4.1 0.0 - 4.4 ratio  POCT glucose (manual entry)  Result Value Ref Range   POC Glucose 158 (A) 70 - 99 mg/dl  POCT glycosylated hemoglobin (Hb A1C)  Result Value Ref Range   Hemoglobin A1C 7.3    BS: 196.   ASSESSMENT AND PLAN: 1. New onset type 2 diabetes mellitus ([place]) -Prescription given for meter on diabetic  supplies so that she can check blood sugar. -Continue metformin -Discussed and encouraged cutting back on portion sizes - POCT glucose (manual entry) - Blood Glucose Monitoring Suppl (TRUE METRIX METER) w/Device KIT; Use as directed  Dispense: 1 kit; Refill: 0 - glucose blood (TRUE METRIX BLOOD GLUCOSE TEST) test strip; Use as instructed  Dispense: 100 each; Refill: 12 - TRUEPLUS LANCETS 28G MISC; Use as directed  Dispense: 100 each; Refill: 2  2. Anxiety and depression -Stop Cymbalta since this has caused some urinary retention for her -She would like to be tried on a different medication until she is established with a psychiatrist at [facility]. She is agreeable to starting Zoloft. Patient to follow-up if depression or anxiety worsens with this medication -Follow up in 4 weeks with LCSW to see how she is doing - sertraline (ZOLOFT) 50 MG tablet; 1/2 tab daily x 1 wk then 1 tab PO daily  Dispense: 30 tablet; Refill: 3  3. Tobacco abuse Patient advised to quit smoking. Discussed health risks associated with smoking including lung and other types of cancers, chronic lung diseases and CV risks.. Pt not ready to give trail of quitting.    4. Urinary retention See #2 above 5. Hyperlipidemia, unspecified hyperlipidemia type Continue Lipitor  6. Colon cancer screening - Ambulatory referral to Gastroenterology  7. Gum disease - Ambulatory referral to Dentistry  8. Class 3 severe obesity due to excess calories with serious comorbidity and body mass index (BMI) of 50.0 to 59.9 in adult [facility]) Dietary counseling discussed She is agreeable to physical therapy to help her get moving and to help with  lower back pain  9. Radicular low back pain - Ambulatory referral to Physical Therapy  10. Physical deconditioning - Ambulatory referral to Physical Therapy  11. Abnormal cervical Papanicolaou smear, unspecified abnormal pap finding Keep appointment with GYN next month  Patient was given the  opportunity to ask questions.  Patient verbalized understanding of the plan and was able to repeat key elements of the plan.   Orders Placed This Encounter  Procedures  . Ambulatory referral to Dentistry  . Ambulatory referral to Gastroenterology  . Ambulatory referral to Physical Therapy  . POCT glucose (manual entry)     Requested Prescriptions   Signed Prescriptions Disp Refills  . sertraline (ZOLOFT) 50 MG tablet 30 tablet 3    Sig: 1/2 tab daily x 1 wk then 1 tab PO daily  . Blood Glucose Monitoring Suppl (TRUE METRIX METER) w/Device KIT 1 kit 0    Sig: Use as directed  . glucose blood (TRUE METRIX BLOOD GLUCOSE TEST) test strip 100 each 12    Sig: Use as instructed  . TRUEPLUS LANCETS 28G MISC 100 each 2    Sig: Use as directed    Return in about 3 months (around 01/12/2017).  Karle Plumber, MD, FACP

## 2016-10-15 ENCOUNTER — Encounter: Payer: Self-pay | Attending: Physical Medicine & Rehabilitation

## 2016-10-15 ENCOUNTER — Ambulatory Visit: Payer: Self-pay | Admitting: Physical Medicine & Rehabilitation

## 2016-10-25 ENCOUNTER — Ambulatory Visit: Payer: Self-pay | Admitting: Obstetrics & Gynecology

## 2016-11-11 ENCOUNTER — Other Ambulatory Visit: Payer: Self-pay | Admitting: Internal Medicine

## 2016-11-11 MED FILL — ?SERTRALINE HCL 50 MG TAB: 50 | 35 days supply | Qty: 30 | Fill #1

## 2016-11-11 MED FILL — METOPROLOL TARTRATE 25 MG T: 25 | 30 days supply | Qty: 60 | Fill #3

## 2016-11-11 MED FILL — BUPROPION SR 150 MG TABLET: 150 | 30 days supply | Qty: 60 | Fill #1

## 2016-11-11 MED FILL — TRUE METRIX TEST STRIP: 30 days supply | Qty: 100 | Fill #1

## 2016-11-11 MED FILL — TRUEplus LANCETS 28G MISC: 30 days supply | Qty: 100 | Fill #1

## 2016-11-11 MED FILL — !VENTOLIN HFA INHALER: 108 (90 BAS | 16 days supply | Qty: 18 | Fill #4

## 2016-11-11 MED FILL — ?METFORMIN HCL 500MG TABLET: 500 | 30 days supply | Qty: 60 | Fill #3

## 2016-11-11 MED FILL — GABAPENTIN 300 MG CAPSULE: 300 | 15 days supply | Qty: 120 | Fill #3

## 2016-11-11 MED FILL — ?CHLORTHALIDONE 25 MG TABLE: 25 | 30 days supply | Qty: 30 | Fill #1

## 2016-11-11 MED FILL — LISINOPRIL 10 MG TABS: 10 | 30 days supply | Qty: 30 | Fill #10

## 2016-12-08 MED FILL — ?METFORMIN HCL 500MG TABLET: 500 | 30 days supply | Qty: 60 | Fill #4

## 2016-12-08 MED FILL — GABAPENTIN 300 MG CAPSULE: 300 | 15 days supply | Qty: 120 | Fill #4

## 2016-12-08 MED FILL — ?CHLORTHALIDONE 25 MG TABLE: 25 | 30 days supply | Qty: 30 | Fill #2

## 2016-12-08 MED FILL — !VENTOLIN HFA INHALER: 108 (90 BAS | 16 days supply | Qty: 18 | Fill #5

## 2016-12-08 MED FILL — ?ATORVASTATIN 10 MG TABLET: 10 | 30 days supply | Qty: 30 | Fill #0

## 2016-12-10 ENCOUNTER — Other Ambulatory Visit: Payer: Self-pay | Admitting: Internal Medicine

## 2016-12-10 ENCOUNTER — Other Ambulatory Visit: Payer: Self-pay | Admitting: Pharmacist

## 2016-12-10 DIAGNOSIS — I10 Essential (primary) hypertension: Secondary | ICD-10-CM

## 2016-12-10 MED ORDER — LISINOPRIL 10 MG PO TABS: 10.0000 mg | ORAL_TABLET | Freq: Every day | ORAL | 0 refills | Status: DC

## 2016-12-10 MED ORDER — METOPROLOL TARTRATE 25 MG PO TABS: 25.0000 mg | ORAL_TABLET | Freq: Two times a day (BID) | ORAL | 3 refills | Status: DC

## 2016-12-10 MED FILL — METOPROLOL TARTRATE 25 MG T: 25 | 30 days supply | Qty: 60 | Fill #0

## 2016-12-10 MED FILL — LISINOPRIL 10 MG TABS: 10 | 30 days supply | Qty: 30 | Fill #0

## 2016-12-10 NOTE — Telephone Encounter (Signed)
Lisinopril refilled.

## 2016-12-10 NOTE — Telephone Encounter (Signed)
Pts husband came in to get her lisinopril and she doesn't have any refills on it if you could please send a refill over and please fu

## 2016-12-31 ENCOUNTER — Other Ambulatory Visit: Payer: Self-pay | Admitting: Obstetrics and Gynecology

## 2016-12-31 DIAGNOSIS — Z1231 Encounter for screening mammogram for malignant neoplasm of breast: Secondary | ICD-10-CM

## 2017-01-06 ENCOUNTER — Other Ambulatory Visit: Payer: Self-pay | Admitting: Internal Medicine

## 2017-01-06 ENCOUNTER — Other Ambulatory Visit: Payer: Self-pay

## 2017-01-06 DIAGNOSIS — R0602 Shortness of breath: Secondary | ICD-10-CM

## 2017-01-06 DIAGNOSIS — I10 Essential (primary) hypertension: Secondary | ICD-10-CM

## 2017-01-06 MED ORDER — ALBUTEROL SULFATE HFA 108 (90 BASE) MCG/ACT IN AERS: INHALATION_SPRAY | RESPIRATORY_TRACT | 0 refills | Status: DC

## 2017-01-06 MED FILL — !VENTOLIN HFA INHALER: 108 (90 BAS | 50 days supply | Qty: 54 | Fill #0

## 2017-01-06 MED FILL — GABAPENTIN 300 MG CAPSULE: 300 | 15 days supply | Qty: 120 | Fill #5

## 2017-01-06 MED FILL — METOPROLOL TARTRATE 25 MG T: 25 | 30 days supply | Qty: 60 | Fill #1

## 2017-01-06 MED FILL — ?ATORVASTATIN 10 MG TABLET: 10 | 30 days supply | Qty: 30 | Fill #1

## 2017-01-06 MED FILL — LISINOPRIL 10 MG TABS: 10 | 30 days supply | Qty: 30 | Fill #1

## 2017-01-06 MED FILL — ?METFORMIN HCL 500MG TABLET: 500 | 30 days supply | Qty: 60 | Fill #5

## 2017-01-07 MED FILL — ?CHLORTHALIDONE 25 MG TABLE: 25 | 30 days supply | Qty: 30 | Fill #0

## 2017-01-10 IMAGING — DX DG LUMBAR SPINE 2-3V
3 series · 3 of 3 positions shown · non-contrast
Comparison: None.

CLINICAL DATA: Fell 2 months ago. Pain in lower back since the
fall.

EXAM:
LUMBAR SPINE - 2-3 VIEW

[l-spine ap]
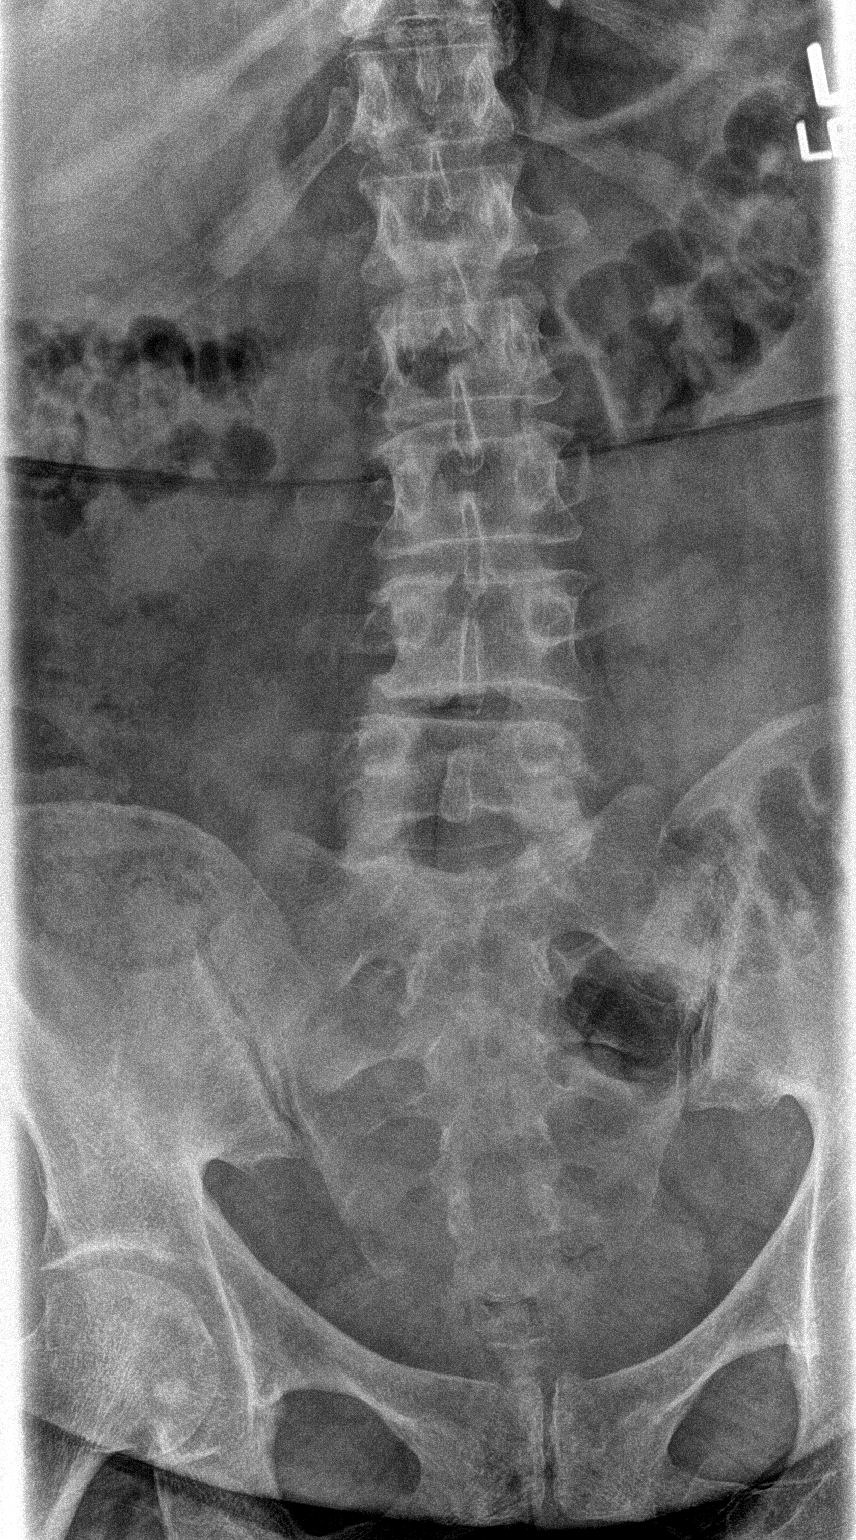

[l-spine lat]
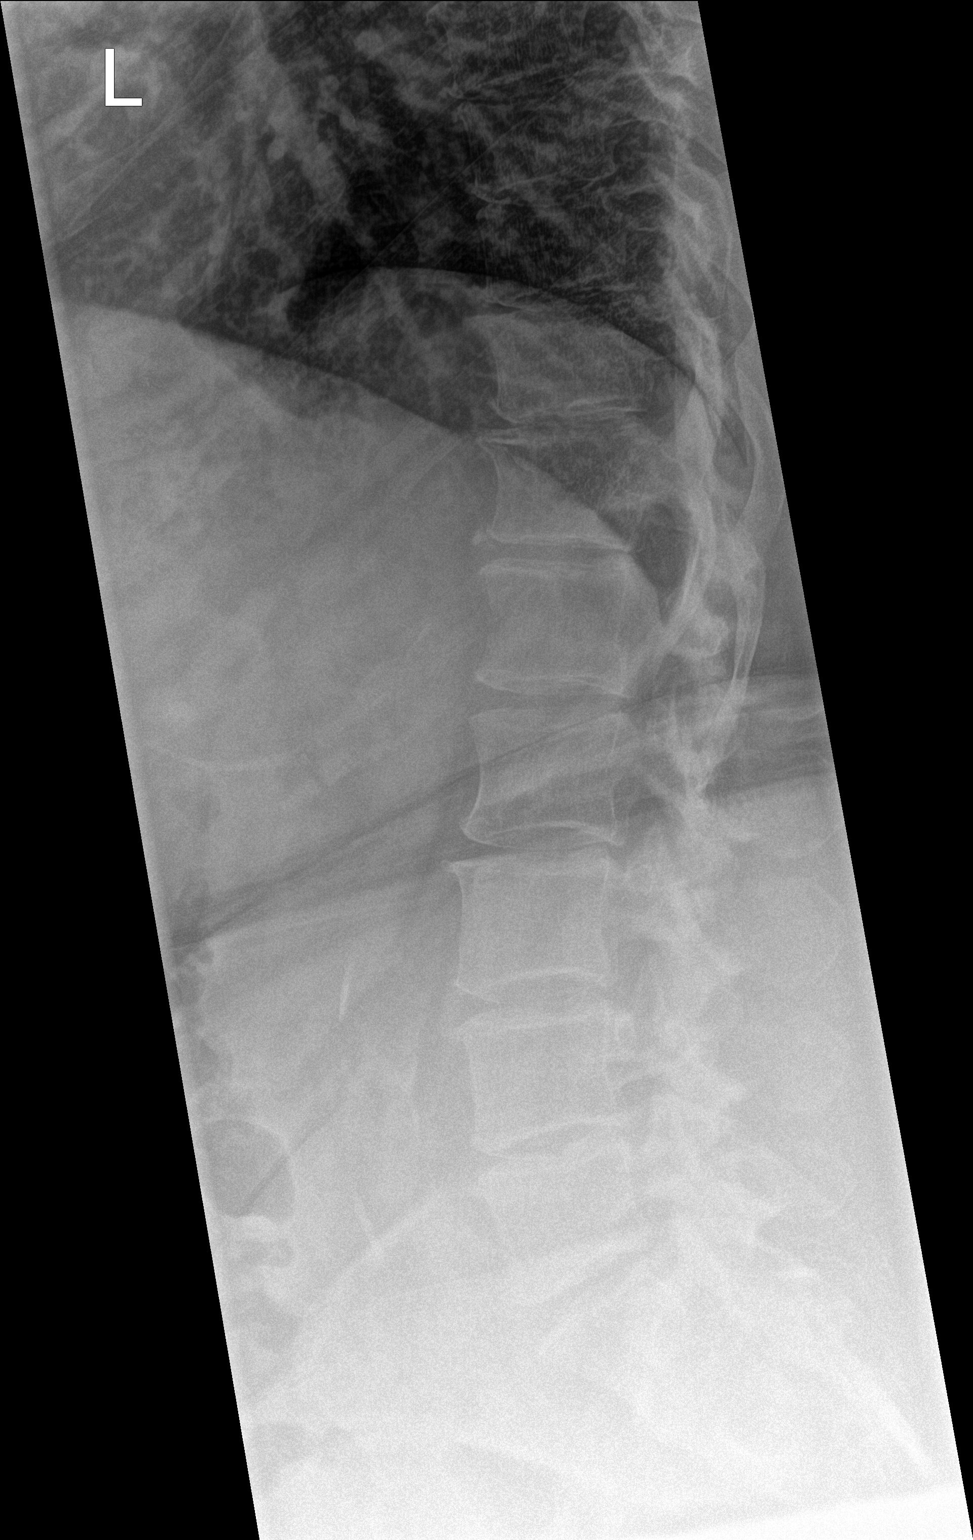

[l-spine spot]
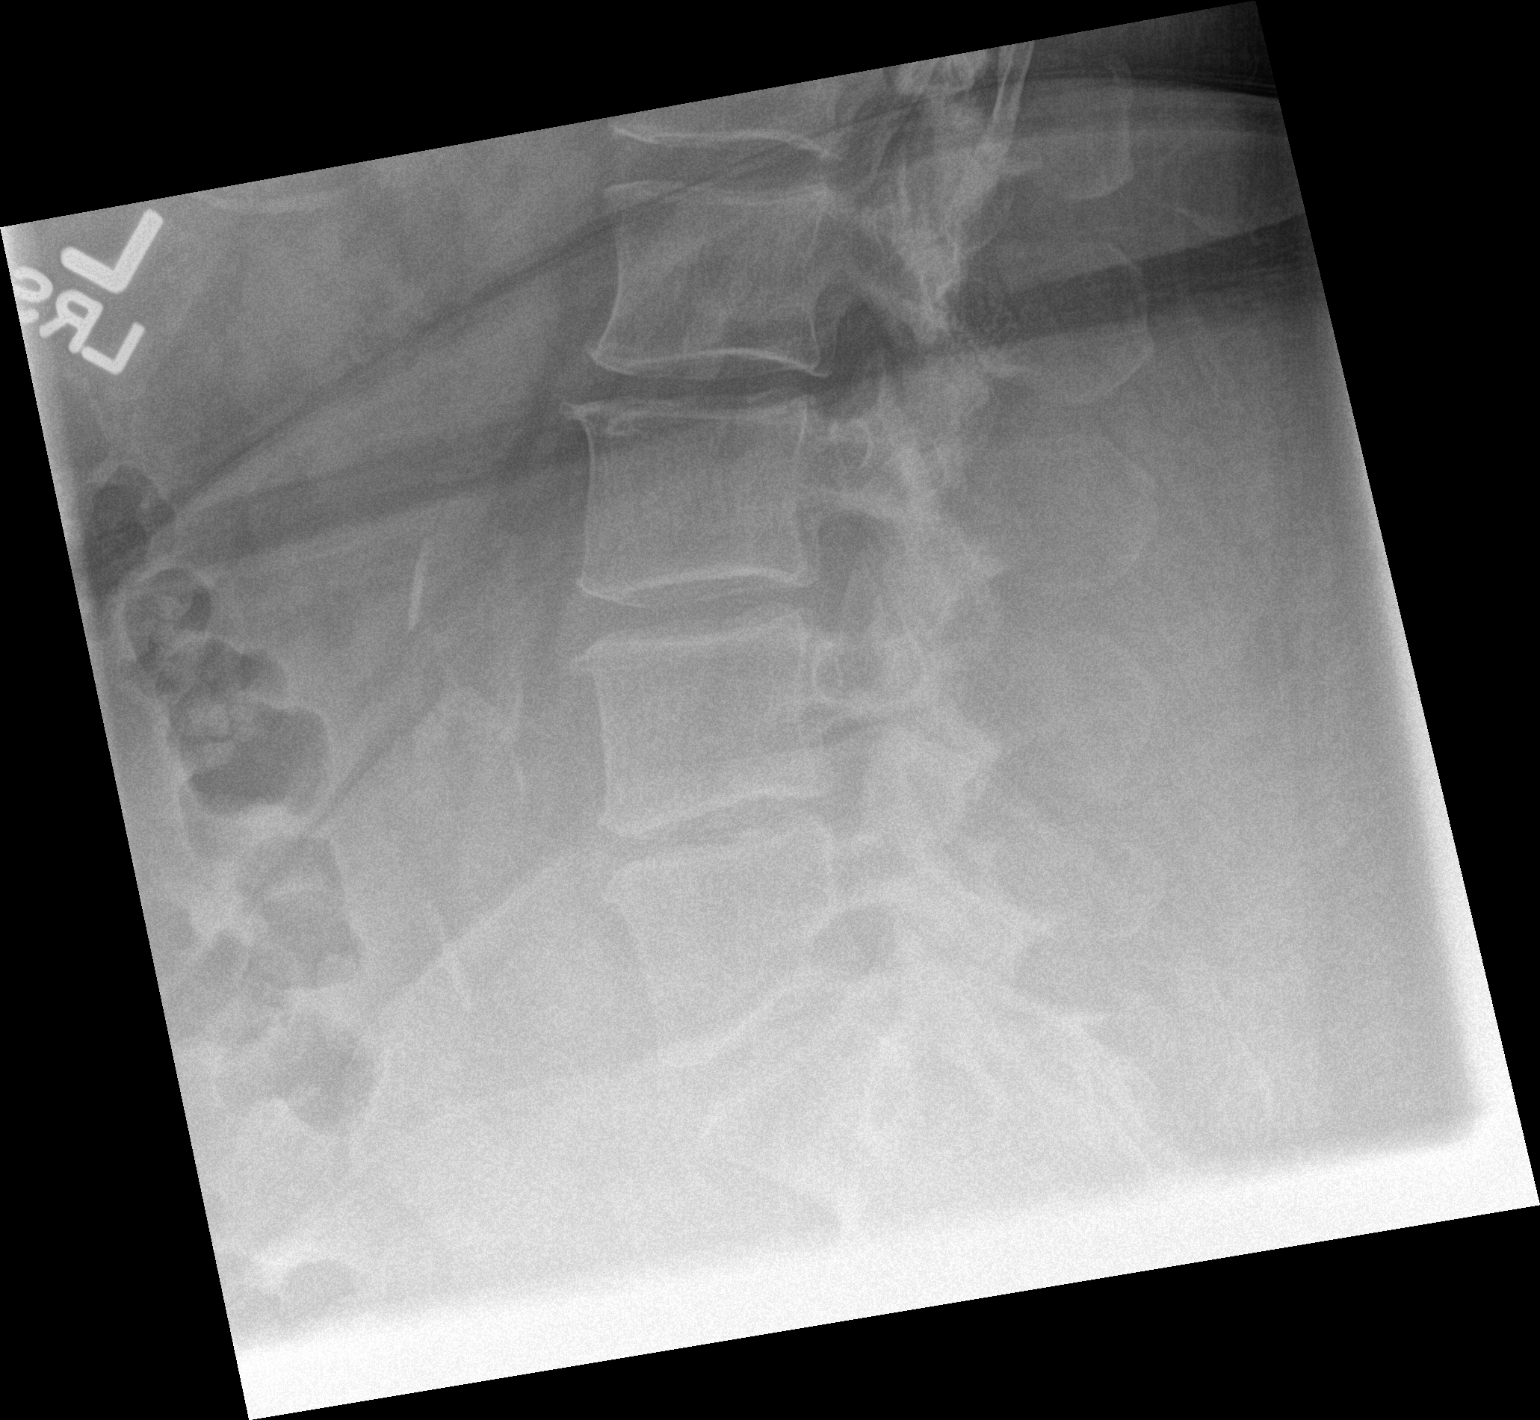

[3 of 3 positions shown; findings below may reference images not displayed]

FINDINGS: There is no evidence of lumbar spine fracture. Alignment is normal.
There is disc height loss at L4-5. Facet hypertrophy is identified
in the lower levels. No suspicious lytic or blastic lesions are
identified.
IMPRESSION: 1.  No evidence for acute  abnormality.
2. Mild L4-5 spondylosis.

## 2017-01-11 ENCOUNTER — Encounter: Payer: Self-pay | Admitting: Internal Medicine

## 2017-01-11 ENCOUNTER — Ambulatory Visit: Payer: Self-pay | Attending: Internal Medicine | Admitting: Internal Medicine

## 2017-01-11 ENCOUNTER — Ambulatory Visit
Admission: RE | Admit: 2017-01-11 | Discharge: 2017-01-11 | Disposition: A | Discharge status: Home / Self Care | Payer: No Typology Code available for payment source | Source: Ambulatory Visit | Attending: Obstetrics and Gynecology | Admitting: Obstetrics and Gynecology

## 2017-01-11 ENCOUNTER — Ambulatory Visit (HOSPITAL_COMMUNITY)
Admission: RE | Admit: 2017-01-11 | Discharge: 2017-01-11 | Disposition: A | Discharge status: Home / Self Care | Payer: Self-pay | Source: Ambulatory Visit | Attending: Obstetrics and Gynecology | Admitting: Obstetrics and Gynecology

## 2017-01-11 ENCOUNTER — Encounter (HOSPITAL_COMMUNITY): Payer: Self-pay

## 2017-01-11 VITALS — BP 126/84 | Temp 98.7°F | Ht 66.0 in | Wt 311.6 lb

## 2017-01-11 VITALS — BP 112/80 | HR 85 | Temp 97.8°F | Ht 66.0 in | Wt 310.0 lb

## 2017-01-11 DIAGNOSIS — F17210 Nicotine dependence, cigarettes, uncomplicated: Secondary | ICD-10-CM | POA: Insufficient documentation

## 2017-01-11 DIAGNOSIS — F419 Anxiety disorder, unspecified: Secondary | ICD-10-CM

## 2017-01-11 DIAGNOSIS — Z9889 Other specified postprocedural states: Secondary | ICD-10-CM | POA: Insufficient documentation

## 2017-01-11 DIAGNOSIS — Z8249 Family history of ischemic heart disease and other diseases of the circulatory system: Secondary | ICD-10-CM | POA: Insufficient documentation

## 2017-01-11 DIAGNOSIS — Z1231 Encounter for screening mammogram for malignant neoplasm of breast: Secondary | ICD-10-CM

## 2017-01-11 DIAGNOSIS — Z7982 Long term (current) use of aspirin: Secondary | ICD-10-CM | POA: Insufficient documentation

## 2017-01-11 DIAGNOSIS — M545 Low back pain: Secondary | ICD-10-CM | POA: Insufficient documentation

## 2017-01-11 DIAGNOSIS — Z809 Family history of malignant neoplasm, unspecified: Secondary | ICD-10-CM | POA: Insufficient documentation

## 2017-01-11 DIAGNOSIS — Z01419 Encounter for gynecological examination (general) (routine) without abnormal findings: Secondary | ICD-10-CM

## 2017-01-11 DIAGNOSIS — Z87898 Personal history of other specified conditions: Secondary | ICD-10-CM

## 2017-01-11 DIAGNOSIS — IMO0001 Reserved for inherently not codable concepts without codable children: Secondary | ICD-10-CM

## 2017-01-11 DIAGNOSIS — Z833 Family history of diabetes mellitus: Secondary | ICD-10-CM | POA: Insufficient documentation

## 2017-01-11 DIAGNOSIS — F329 Major depressive disorder, single episode, unspecified: Secondary | ICD-10-CM | POA: Insufficient documentation

## 2017-01-11 DIAGNOSIS — E1165 Type 2 diabetes mellitus with hyperglycemia: Secondary | ICD-10-CM | POA: Insufficient documentation

## 2017-01-11 DIAGNOSIS — Z23 Encounter for immunization: Secondary | ICD-10-CM

## 2017-01-11 DIAGNOSIS — Z1211 Encounter for screening for malignant neoplasm of colon: Secondary | ICD-10-CM

## 2017-01-11 DIAGNOSIS — M1612 Unilateral primary osteoarthritis, left hip: Secondary | ICD-10-CM | POA: Insufficient documentation

## 2017-01-11 DIAGNOSIS — E119 Type 2 diabetes mellitus without complications: Secondary | ICD-10-CM

## 2017-01-11 DIAGNOSIS — Z79899 Other long term (current) drug therapy: Secondary | ICD-10-CM | POA: Insufficient documentation

## 2017-01-11 DIAGNOSIS — M5410 Radiculopathy, site unspecified: Secondary | ICD-10-CM

## 2017-01-11 DIAGNOSIS — I10 Essential (primary) hypertension: Secondary | ICD-10-CM

## 2017-01-11 DIAGNOSIS — G4733 Obstructive sleep apnea (adult) (pediatric): Secondary | ICD-10-CM | POA: Insufficient documentation

## 2017-01-11 DIAGNOSIS — Z8742 Personal history of other diseases of the female genital tract: Secondary | ICD-10-CM

## 2017-01-11 HISTORY — DX: Type 2 diabetes mellitus without complications: E11.9

## 2017-01-11 LAB — GLUCOSE, POCT (MANUAL RESULT ENTRY): POC Glucose: 158 mg/dl — AB (ref 70–99)

## 2017-01-11 LAB — POCT GLYCOSYLATED HEMOGLOBIN (HGB A1C): HEMOGLOBIN A1C: 7.3

## 2017-01-11 MED ORDER — IBUPROFEN 800 MG PO TABS: 800.0000 mg | ORAL_TABLET | Freq: Three times a day (TID) | ORAL | 3 refills | Status: DC | PRN

## 2017-01-11 MED ORDER — SERTRALINE HCL 100 MG PO TABS: 150.0000 mg | ORAL_TABLET | Freq: Every day | ORAL | 6 refills | Status: DC

## 2017-01-11 MED ORDER — METFORMIN HCL 500 MG PO TABS: ORAL_TABLET | ORAL | 3 refills | Status: DC

## 2017-01-11 NOTE — Progress Notes (Signed)
Patient ID: Tracy Fitzgerald, female    DOB: [birth year]  MRN: [id number]  CC: No chief complaint on file.   Subjective: Tracy Fitzgerald is a 51 y.o. female who presents for chronic ds management. Husband, Lennette Bihari, is with her Her concerns today include:  51 year old with history of diabetes 2, tobacco dependence, radicular lower back pain (mod. LT foraminal stenosis and small central disc bulge at T12-L1 on MRI 05/2015), HTN, anxiety/depression, obesity, abnormal Pap, OSA, and  hyperlipidemia  1. Abnormal Pap smear: She has appointment today with GYN.  2. Dep/Anx: Since last visit she has establish care with [facility]. She has been seen twice by a therapist and psychiatrist there.  -kept on Wellbutrin, Zoloft increased to 150 mg and Hydroxyzine TID was added  3. Colonoscopy referral: Patient states she was never called for the appointment   4.  LBP/physical decondintioning:  -She was supposed of seeing the pain specialist a week or 2 after her last visit with me. -Referred to PT on last visit.  could not afford the P.T at $20 a visit.   5. Diabetes: BS range has been 170-190 Eating habits  -does a lot of emotional eating Exercise -not getting in much due to chronic back pain  Currently living in motel.she does have a refrigerator and cooking appliances.   Patient Active Problem List   Diagnosis Date Noted  . Hyperlipidemia, unspecified 10/10/2016  . Radicular low back pain 08/12/2016  . Anxiety and depression 08/12/2016  . Class 3 severe obesity due to excess calories with serious comorbidity and body mass index (BMI) of 50.0 to 59.9 in adult ([place]) 08/12/2016  . New onset type 2 diabetes mellitus ([place]) 11/25/2015  . Severe dysplasia of cervix (CIN III) 06/25/2015  . Hemorrhoid 06/20/2015  . Perimenopausal 06/20/2015  . Osteoarthritis of spine with radiculopathy, lumbar region 05/29/2015  . Primary osteoarthritis of left hip 05/29/2015  . Vitamin D insufficiency 11/13/2014  .  Diastolic CHF ([place]) 81/85/6314  . Numbness of foot 11/12/2014  . OSA (obstructive sleep apnea) 11/12/2014  . Tobacco abuse 06/17/2014  . Essential hypertension 06/17/2014     Current Outpatient Prescriptions on File Prior to Visit  Medication Sig Dispense Refill  . albuterol (VENTOLIN HFA) 108 (90 Base) MCG/ACT inhaler INHALE 2 PUFFS INTO THE LUNGS EVERY 4 HOURS AS NEEDED FOR WHEEZING OR SHORTNESS OF BREATH. FOR SHORTNESS OF BREATH 54 g 0  . aspirin 81 MG EC tablet Take 1 tablet (81 mg total) by mouth daily. 90 tablet 3  . Aspirin-Caffeine 845-65 MG PACK Take by mouth.    Marland Kitchen atorvastatin (LIPITOR) 10 MG tablet TAKE 1 TABLET (10 MG TOTAL) BY MOUTH DAILY. 90 tablet 0  . Blood Glucose Monitoring Suppl (TRUE METRIX METER) w/Device KIT Use as directed 1 kit 0  . buPROPion (WELLBUTRIN SR) 150 MG 12 hr tablet TAKE 1 TABLET BY MOUTH 2 TIMES DAILY. 60 tablet 2  . chlorthalidone (HYGROTON) 25 MG tablet TAKE 1 TABLET BY MOUTH EVERY MORNING. 30 tablet 0  . gabapentin (NEURONTIN) 300 MG capsule Take 2 capsules (600 mg total) by mouth 4 (four) times daily. 120 capsule 6  . glucose blood (TRUE METRIX BLOOD GLUCOSE TEST) test strip Use as instructed 100 each 12  . Hyoscyamine Sulfate SL (LEVSIN/SL) 0.125 MG SUBL Place 0.125 mg under the tongue every 4 (four) hours as needed. 40 each 0  . lisinopril (PRINIVIL,ZESTRIL) 10 MG tablet Take 1 tablet (10 mg total) by mouth daily. 90 tablet 0  .  metoprolol tartrate (LOPRESSOR) 25 MG tablet Take 1 tablet (25 mg total) by mouth 2 (two) times daily. 180 tablet 3  . TRUEPLUS LANCETS 28G MISC Use as directed 100 each 2   No current facility-administered medications on file prior to visit.     No Known Allergies  Social History   Social History  . Marital status: Married    Spouse name: N/A  . Number of children: N/A  . Years of education: N/A   Occupational History  . Not on file.   Social History Main Topics  . Smoking status: Current Every Day Smoker     Packs/day: 0.25    Years: 33.00    Types: Cigarettes  . Smokeless tobacco: Current User  . Alcohol use Yes     Comment: occasionally  . Drug use: No  . Sexual activity: Yes    Partners: Male    Birth control/ protection: Post-menopausal   Other Topics Concern  . Not on file   Social History Narrative  . No narrative on file    Family History  Problem Relation Age of Onset  . Hypertension Mother   . Diabetes Mother   . Uterine cancer Mother   . Heart disease Mother   . Cancer Mother        UTERINE CANCER   . Hypertension Father   . Kidney disease Father     Past Surgical History:  Procedure Laterality Date  . ADENOIDECTOMY    . TONSILLECTOMY      ROS: Review of Systems Negative except as stated above PHYSICAL EXAM: BP 112/80   Pulse 85   Temp 97.8 F (36.6 C) (Oral)   Ht '5\' 6"'  (1.676 m)   Wt (!) 310 lb (140.6 kg)   SpO2 96%   BMI 50.04 kg/m   Physical Exam  General appearance -morbidly obese caucasian female in NAD.  Mental status - alert, oriented to person, place, and time.  Eyes -Pink conjunctiva, PERRLA Neck - supple, no significant adenopathy Chest - clear to auscultation, no wheezes, rales or rhonchi, symmetric air entry Heart - normal rate, regular rhythm, normal S1, S2, no murmurs, rubs, clicks or gallops Extremities - no Le edema. Diabetic Foot Exam - Simple   Simple Foot Form Visual Inspection No deformities, no ulcerations, no other skin breakdown bilaterally:  Yes Sensation Testing Intact to touch and monofilament testing bilaterally:  Yes Pulse Check Posterior Tibialis and Dorsalis pulse intact bilaterally:  Yes Comments     Lab Results  Component Value Date   CHOL 201 (H) 08/12/2016   HDL 49 08/12/2016   LDLCALC 99 08/12/2016   TRIG 264 (H) 08/12/2016   CHOLHDL 4.1 08/12/2016    Results for orders placed or performed in visit on 01/11/17  POCT glucose (manual entry)  Result Value Ref Range   POC Glucose 158 (A) 70 - 99  mg/dl  POCT glycosylated hemoglobin (Hb A1C)  Result Value Ref Range   Hemoglobin A1C 7.3    Lab Results  Component Value Date   HGBA1C 7.3 01/11/2017     Chemistry      Component Value Date/Time   NA 137 08/12/2016 1214   K 4.4 08/12/2016 1214   CL 99 08/12/2016 1214   CO2 21 08/12/2016 1214   BUN 20 08/12/2016 1214   CREATININE 1.14 (H) 08/12/2016 1214   CREATININE 0.93 11/25/2015 1003      Component Value Date/Time   CALCIUM 10.2 08/12/2016 1214   ALKPHOS 98 08/12/2016  1214   AST 24 08/12/2016 1214   ALT 44 (H) 08/12/2016 1214   BILITOT 0.4 08/12/2016 1214      .ASSESSMENT AND PLAN: 1. Uncontrolled diabetes mellitus without complication, without long-term current use of insulin ([place]) -Close to goal. Increase metformin 1000 mg in the mornings and 500 mg in the evenings - POCT glucose (manual entry) - POCT glycosylated hemoglobin (Hb Y5R) - Basic Metabolic Panel  2. Anxiety and depression -Patient plugged into care with [facility] Med list updated with medications reported  3. Essential hypertension -At goal. Continue chlorthalidone, metoprolol and lisinopril  4. Hx of abnormal cervical Pap smear Keep GYN appointment today  5. Radicular low back pain -Unable to afford physical therapy at this time even with the [facility] card Refilled ibuprofen per her request. Check BMP today for kidney function Continue gabapentin  6. Colon cancer screening - Ambulatory referral to Gastroenterology  7. Need for influenza vaccination - Flu Vaccine QUAD 6+ mos PF IM (Fluarix Quad PF)   Patient was given the opportunity to ask questions.  Patient verbalized understanding of the plan and was able to repeat key elements of the plan.   Orders Placed This Encounter  Procedures  . Flu Vaccine QUAD 6+ mos PF IM (Fluarix Quad PF)  . Basic Metabolic Panel  . Ambulatory referral to Gastroenterology  . POCT glucose (manual entry)  . POCT glycosylated hemoglobin (Hb A1C)      Requested Prescriptions   Signed Prescriptions Disp Refills  . metFORMIN (GLUCOPHAGE) 500 MG tablet 90 tablet 3    Sig: Take 2 tabs in a.m and one at nights.  Marland Kitchen ibuprofen (ADVIL,MOTRIN) 800 MG tablet 30 tablet 3    Sig: Take 1 tablet (800 mg total) by mouth every 8 (eight) hours as needed for moderate pain.  Marland Kitchen sertraline (ZOLOFT) 100 MG tablet 45 tablet 6    Sig: Take 1.5 tablets (150 mg total) by mouth daily.    Return in about 3 months (around 04/13/2017).  Karle Plumber, MD, FACP

## 2017-01-11 NOTE — Patient Instructions (Addendum)
Increase Metformin to 500 mg two tablets in the a.m and one at bedtime.   Follow a Healthy Eating Plan - You can do it! Limit sugary drinks.  Avoid sodas, sweet tea, sport or energy drinks, or fruit drinks.  Drink water, lo-fat milk, or diet drinks. Limit snack foods.   Cut back on candy, cake, cookies, chips, ice cream.  These are a special treat, only in small amounts. Eat plenty of vegetables.  Especially dark green, red, and orange vegetables. Aim for at least 3 servings a day. More is better! Include fruit in your daily diet.  Whole fruit is much healthier than fruit juice! Limit "white" bread, "white" pasta, "white" rice.   Choose "100% whole grain" products, brown or wild rice. Avoid fatty meats. Try "Meatless Monday" and choose eggs or beans one day a week.  When eating meat, choose lean meats like chicken, [place], and fish.  Grill, broil, or bake meats instead of frying, and eat poultry without the skin. Eat less salt.  Avoid frozen pizzas, frozen dinners and salty foods.  Use seasonings other than salt in cooking.  This can help blood pressure and keep you from swelling Beer, wine and liquor have calories.  If you can safely drink alcohol, limit to 1 drink per day for women, 2 drinks for men  Influenza Virus Vaccine injection (Fluarix) What is this medicine? INFLUENZA VIRUS VACCINE (in floo EN zuh VAHY ruhs vak SEEN) helps to reduce the risk of getting influenza also known as the flu. This medicine may be used for other purposes; ask your health care provider or pharmacist if you have questions. COMMON BRAND NAME(S): Fluarix, Fluzone What should I tell my health care provider before I take this medicine? They need to know if you have any of these conditions: -bleeding disorder like hemophilia -fever or infection -Guillain-Barre syndrome or other neurological problems -immune system problems -infection with the human immunodeficiency virus (HIV) or AIDS -low blood platelet  counts -multiple sclerosis -an unusual or allergic reaction to influenza virus vaccine, eggs, chicken proteins, latex, gentamicin, other medicines, foods, dyes or preservatives -pregnant or trying to get pregnant -breast-feeding How should I use this medicine? This vaccine is for injection into a muscle. It is given by a health care professional. A copy of Vaccine Information Statements will be given before each vaccination. Read this sheet carefully each time. The sheet may change frequently. Talk to your pediatrician regarding the use of this medicine in children. Special care may be needed. Overdosage: If you think you have taken too much of this medicine contact a poison control center or emergency room at once. NOTE: This medicine is only for you. Do not share this medicine with others. What if I miss a dose? This does not apply. What may interact with this medicine? -chemotherapy or radiation therapy -medicines that lower your immune system like etanercept, anakinra, infliximab, and adalimumab -medicines that treat or prevent blood clots like warfarin -phenytoin -steroid medicines like prednisone or cortisone -theophylline -vaccines This list may not describe all possible interactions. Give your health care provider a list of all the medicines, herbs, non-prescription drugs, or dietary supplements you use. Also tell them if you smoke, drink alcohol, or use illegal drugs. Some items may interact with your medicine. What should I watch for while using this medicine? Report any side effects that do not go away within 3 days to your doctor or health care professional. Call your health care provider if any unusual symptoms occur  within 6 weeks of receiving this vaccine. You may still catch the flu, but the illness is not usually as bad. You cannot get the flu from the vaccine. The vaccine will not protect against colds or other illnesses that may cause fever. The vaccine is needed every  year. What side effects may I notice from receiving this medicine? Side effects that you should report to your doctor or health care professional as soon as possible: -allergic reactions like skin rash, itching or hives, swelling of the face, lips, or tongue Side effects that usually do not require medical attention (report to your doctor or health care professional if they continue or are bothersome): -fever -headache -muscle aches and pains -pain, tenderness, redness, or swelling at site where injected -weak or tired This list may not describe all possible side effects. Call your doctor for medical advice about side effects. You may report side effects to FDA at 1-800-FDA-1088. Where should I keep my medicine? This vaccine is only given in a clinic, pharmacy, doctor's office, or other health care setting and will not be stored at home. NOTE: This sheet is a summary. It may not cover all possible information. If you have questions about this medicine, talk to your doctor, pharmacist, or health care provider.  2018 Elsevier/Gold Standard (2007-09-27 09:30:40)

## 2017-01-11 NOTE — Progress Notes (Signed)
No complaints today.   Pap Smear: Pap smear completed today. Last Pap smear was 06/20/2015 at [facility]. 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 Per patient has no history of an abnormal Pap smear prior to her last Pap smear. Last Pap smear, colposcopy, and LEEP results are in EPIC.  Physical exam: Breasts Left breast larger than right breast that per patient is normal for her. No skin abnormalities bilateral breasts. No nipple retraction bilateral breasts. No nipple discharge bilateral breasts. No lymphadenopathy. No lumps palpated bilateral breasts. No complaints of pain or tenderness on exam. Referred patient to the [place] for a screening mammogram. Appointment scheduled for Tuesday, January 11, 2017 at 1530.      Pelvic/Bimanual   Ext Genitalia No lesions, no swelling and no discharge observed on external genitalia.         Vagina Vagina pink and normal texture. No lesions or discharge observed in vagina.          Cervix Cervix is present. Cervix pink and of normal texture. No discharge observed.     Uterus Uterus is present and palpable. Uterus in normal position and normal size.        Adnexae Bilateral ovaries present and unable to palpate. No tenderness on palpation.          Rectovaginal No rectal exam completed today since patient had no rectal complaints. No skin abnormalities observed on exam.    Smoking History: Patient is a current smoker. Smoking cessation discussed with patient. Referred patient to the [facility] Quitline and gave resources for free classes offered at [facility].  Patient Navigation: Patient education provided. Access to services provided for patient through [facility] program.   Colorectal Cancer Screening: Patient has never had a colonoscopy and has one scheduled for 03/22/2017 at [facility]. No complaints today. FIT Test  given to patient to complete and return to [facility].

## 2017-01-11 NOTE — Patient Instructions (Signed)
Explained breast self awareness with [name]. Let patient know that her next Pap smear will be due based on the results of today's Pap smear. Referred patient to the [place] for a screening mammogram. Appointment scheduled for Tuesday, January 11, 2017 at 1530. Let patient know will follow up with her within the next couple weeks with results of Pap smear by phone. Informed patient that the [facility] will follow-up with her within the next couple of weeks with results of mammogram by letter or phone..Smoking cessation discussed with patient. Referred patient to the [facility] Quitline and gave resources for free classes offered at [facility]. Tracy Fitzgerald verbalized understanding.  [name], Arvil Chaco, RN 3:07 PM

## 2017-01-12 ENCOUNTER — Encounter (HOSPITAL_COMMUNITY): Payer: Self-pay | Admitting: *Deleted

## 2017-01-12 LAB — BASIC METABOLIC PANEL
BUN/Creatinine Ratio: 18 (ref 9–23)
BUN: 19 mg/dL (ref 6–24)
CALCIUM: 10.6 mg/dL — AB (ref 8.7–10.2)
CHLORIDE: 98 mmol/L (ref 96–106)
CO2: 23 mmol/L (ref 20–29)
Creatinine, Ser: 1.05 mg/dL — ABNORMAL HIGH (ref 0.57–1.00)
GFR calc Af Amer: 71 mL/min/{1.73_m2} (ref >59)
GFR calc non Af Amer: 62 mL/min/{1.73_m2} (ref >59)
Glucose: 134 mg/dL — ABNORMAL HIGH (ref 65–99)
Potassium: 4.4 mmol/L (ref 3.5–5.2)
SODIUM: 140 mmol/L (ref 134–144)

## 2017-01-13 LAB — CYTOLOGY - PAP
Diagnosis: UNDETERMINED — AB
HPV: DETECTED — AB

## 2017-01-26 MED FILL — ?METFORMIN HCL 500MG TABLET: 500 | 30 days supply | Qty: 90 | Fill #0

## 2017-01-26 MED FILL — GABAPENTIN 300 MG CAPSULE: 300 | 15 days supply | Qty: 120 | Fill #6

## 2017-02-02 ENCOUNTER — Telehealth (HOSPITAL_COMMUNITY): Payer: Self-pay | Admitting: *Deleted

## 2017-02-02 NOTE — Telephone Encounter (Signed)
Telephoned patient at home number and advised patient of abnormal pap smear. Advised patient would need colposcopy. Patient is scheduled at the [facility] Monday December 17 10:20. Patient voiced understanding.

## 2017-02-09 ENCOUNTER — Other Ambulatory Visit: Payer: Self-pay | Admitting: Internal Medicine

## 2017-02-09 DIAGNOSIS — I10 Essential (primary) hypertension: Secondary | ICD-10-CM

## 2017-02-09 MED FILL — METOPROLOL TARTRATE 25 MG T: 25 | 30 days supply | Qty: 60 | Fill #2

## 2017-02-09 MED FILL — ?CHLORTHALIDONE 25 MG TABLE: 25 | 30 days supply | Qty: 30 | Fill #0

## 2017-02-09 MED FILL — ?ATORVASTATIN 10 MG TABLET: 10 | 30 days supply | Qty: 30 | Fill #2

## 2017-02-09 MED FILL — LISINOPRIL 10 MG TABS: 10 | 30 days supply | Qty: 30 | Fill #2

## 2017-02-14 MED FILL — GABAPENTIN 300 MG CAPSULE: 300 | 30 days supply | Qty: 120 | Fill #1

## 2017-02-17 ENCOUNTER — Telehealth: Payer: Self-pay | Admitting: *Deleted

## 2017-02-17 NOTE — Telephone Encounter (Signed)
Can recheck weight at previsit and if BMI > 50 then please let Tracy Fitzgerald know and she can be scheduled for screening colon at the hospital If below BMI 50 then can schedule [facility] without being seen in the office Thanks

## 2017-02-17 NOTE — Telephone Encounter (Signed)
Pt. Has a P.V. Appointment on 03/01/17 and her BMI is 50.06.  This is above our criteria.  Patient was scheduled as a direct and has never been seen in the office.  Would you like to see patient in office or wait until she comes in for PV to see what her weight and BMI is at that time?  Please advise.  Thank you.  B.Sears Oran, CMA  PV

## 2017-02-18 NOTE — Telephone Encounter (Signed)
Noted and will proceed as scheduled until PV  [facility]

## 2017-02-24 ENCOUNTER — Other Ambulatory Visit: Payer: Self-pay | Admitting: *Deleted

## 2017-02-24 DIAGNOSIS — M5410 Radiculopathy, site unspecified: Secondary | ICD-10-CM

## 2017-02-24 MED ORDER — GABAPENTIN 300 MG PO CAPS: 600.0000 mg | ORAL_CAPSULE | Freq: Four times a day (QID) | ORAL | 2 refills | Status: DC

## 2017-02-28 ENCOUNTER — Encounter: Payer: Self-pay | Admitting: Obstetrics & Gynecology

## 2017-02-28 ENCOUNTER — Ambulatory Visit (INDEPENDENT_AMBULATORY_CARE_PROVIDER_SITE_OTHER): Payer: Self-pay | Admitting: Obstetrics & Gynecology

## 2017-02-28 ENCOUNTER — Other Ambulatory Visit (HOSPITAL_COMMUNITY)
Admission: RE | Admit: 2017-02-28 | Discharge: 2017-02-28 | Disposition: A | Discharge status: Home / Self Care | Payer: Self-pay | Source: Ambulatory Visit | Attending: Obstetrics & Gynecology | Admitting: Obstetrics & Gynecology

## 2017-02-28 VITALS — BP 125/86 | HR 85 | Wt 307.0 lb

## 2017-02-28 DIAGNOSIS — R87610 Atypical squamous cells of undetermined significance on cytologic smear of cervix (ASC-US): Secondary | ICD-10-CM | POA: Insufficient documentation

## 2017-02-28 DIAGNOSIS — Z3202 Encounter for pregnancy test, result negative: Secondary | ICD-10-CM

## 2017-02-28 DIAGNOSIS — R87810 Cervical high risk human papillomavirus (HPV) DNA test positive: Secondary | ICD-10-CM | POA: Insufficient documentation

## 2017-02-28 LAB — POCT PREGNANCY, URINE: Preg Test, Ur: NEGATIVE

## 2017-02-28 NOTE — Progress Notes (Signed)
   Subjective:    Patient ID: Tracy Fitzgerald, female    DOB: 1965/10/15, 51 y.o.   MRN: [id number]  [place] yo married P7 here for a colpo due to the following pap smear. Contains abnormal data Cytology - PAP  Order: [id number]  Status:  Edited Result - FINAL Visible to patient:  Yes (MyChart) Next appt:  03/01/2017 at 03:30 PM in Gastroenterology ([facility] Pre[facility])  Component 15mo ago  Adequacy Satisfactory for evaluation endocervical/transformation zone component PRESENT. Abnormal    Diagnosis ATYPICAL SQUAMOUS CELLS OF UNDETERMINED SIGNIFICANCE (ASC-US). Abnormal    HPV DETECTED Abnormal    Comment: Normal Reference Range - NOT Detected  Material Submitted CervicoVaginal Pap [ThinPrep Imaged] Abnormal    Resulting Agency [facility]      Specimen Collected: 01/11/17 00:00 Last Resulted: 01/13/17 00:00 Lab Flowsheet Order Details View Encounter Lab and Collection Details Routing Result History      Linked          Review of Systems     Objective:   Physical Exam Well nourished, well hydrated white female, no apparent distress Breathing, conversing, and ambulating normally Morbidly obese UPT negative, consent signed, time out done Cervix prepped with acetic acid. Transformation zone seen in its entirety. Colpo adequate. Entirely normal colpo ECC obtained. She tolerated the procedure well.    Assessment & Plan:  If the ECC is negative, then rec pap with cotesting in a year.

## 2017-03-01 ENCOUNTER — Ambulatory Visit (AMBULATORY_SURGERY_CENTER): Payer: Self-pay | Admitting: *Deleted

## 2017-03-01 ENCOUNTER — Other Ambulatory Visit: Payer: Self-pay

## 2017-03-01 VITALS — Ht 66.0 in | Wt 310.0 lb

## 2017-03-01 DIAGNOSIS — Z1211 Encounter for screening for malignant neoplasm of colon: Secondary | ICD-10-CM

## 2017-03-01 MED ORDER — NA SULFATE-K SULFATE-MG SULF 17.5-3.13-1.6 GM/177ML PO SOLN: ORAL | 0 refills | Status: DC

## 2017-03-01 NOTE — Progress Notes (Signed)
Patient denies any allergies to eggs or soy. Patient denies any problems with anesthesia/sedation. Patient denies any oxygen use at home. Patient denies taking any diet/weight loss medications or blood thinners. EMMI education assisgned to patient on colonoscopy, this was explained and instructions given to patient. Pt aware she will have colon at [facility] hospital. Office nurse will call her pt aware.

## 2017-03-03 ENCOUNTER — Telehealth: Payer: Self-pay | Admitting: *Deleted

## 2017-03-03 ENCOUNTER — Other Ambulatory Visit: Payer: Self-pay | Admitting: Internal Medicine

## 2017-03-03 DIAGNOSIS — Z1211 Encounter for screening for malignant neoplasm of colon: Secondary | ICD-10-CM

## 2017-03-03 NOTE — Telephone Encounter (Signed)
error 

## 2017-03-03 NOTE — Telephone Encounter (Deleted)
Patient has been

## 2017-03-07 NOTE — Telephone Encounter (Signed)
Patient has been scheduled for a hospital colonoscopy at [facility] on 04/18/17 at 1030 am. She has been advised that she should arrive at admitting at 900 am on 04/18/17 for registration and have a care partner 51 y.o or older to be bring her, stay with her and take her home. She has already been given prep instructions at her previsit but I will send specific updated instructions to her home address. Patient verbalizes understanding.

## 2017-03-09 ENCOUNTER — Other Ambulatory Visit: Payer: Self-pay | Admitting: Internal Medicine

## 2017-03-09 DIAGNOSIS — I10 Essential (primary) hypertension: Secondary | ICD-10-CM

## 2017-03-09 MED FILL — ?CHLORTHALIDONE 25 MG TABLE: 25 | 30 days supply | Qty: 30 | Fill #1

## 2017-03-09 MED FILL — LISINOPRIL 10 MG TABS: 10 | 30 days supply | Qty: 30 | Fill #0

## 2017-03-09 MED FILL — ?METFORMIN HCL 500MG TABLET: 500 | 30 days supply | Qty: 90 | Fill #1

## 2017-03-09 MED FILL — ?ATORVASTATIN 10 MG TABLET: 10 | 30 days supply | Qty: 30 | Fill #0

## 2017-03-09 MED FILL — ?METOPROLOL 25 MG TABLET: 25 | 30 days supply | Qty: 60 | Fill #3

## 2017-03-11 ENCOUNTER — Other Ambulatory Visit: Payer: Self-pay | Admitting: Internal Medicine

## 2017-03-11 DIAGNOSIS — R0602 Shortness of breath: Secondary | ICD-10-CM

## 2017-03-14 ENCOUNTER — Encounter: Payer: Self-pay | Admitting: General Practice

## 2017-03-22 ENCOUNTER — Encounter: Payer: Self-pay | Admitting: Internal Medicine

## 2017-03-23 ENCOUNTER — Other Ambulatory Visit: Payer: Self-pay | Admitting: Internal Medicine

## 2017-03-23 DIAGNOSIS — M5410 Radiculopathy, site unspecified: Secondary | ICD-10-CM

## 2017-03-23 MED ORDER — GABAPENTIN 300 MG PO CAPS: 600.0000 mg | ORAL_CAPSULE | Freq: Four times a day (QID) | ORAL | 4 refills | Status: DC

## 2017-03-23 MED FILL — GABAPENTIN 300 MG CAPSULE: 300 | 30 days supply | Qty: 240 | Fill #0

## 2017-04-04 NOTE — Progress Notes (Signed)
Spoke to patient for the colonoscopy pre-call, pt requesting to reschedule. Pt will call the office.

## 2017-04-12 ENCOUNTER — Telehealth: Payer: Self-pay | Admitting: Internal Medicine

## 2017-04-13 MED FILL — ?CHLORTHALIDONE 25 MG TABLE: 25 | 30 days supply | Qty: 30 | Fill #2

## 2017-04-13 MED FILL — ?METFORMIN HCL 500MG TABLET: 500 | 30 days supply | Qty: 90 | Fill #2

## 2017-04-13 MED FILL — LISINOPRIL 10 MG TABS: 10 | 30 days supply | Qty: 30 | Fill #1

## 2017-04-13 MED FILL — ?METOPROLOL 25 MG TABLET: 25 | 30 days supply | Qty: 60 | Fill #4

## 2017-04-13 MED FILL — ?ATORVASTATIN 10 MG TABLET: 10 | 30 days supply | Qty: 30 | Fill #1

## 2017-04-13 NOTE — Telephone Encounter (Signed)
Still unable to reach pt

## 2017-04-13 NOTE — Telephone Encounter (Signed)
Attempted to call pt and receive message that she is unable to receive calls at this time. Will try again later.

## 2017-04-14 ENCOUNTER — Ambulatory Visit: Payer: Self-pay | Admitting: Internal Medicine

## 2017-04-14 MED FILL — !VENTOLIN HFA INHALER: 108 (90 BAS | 16 days supply | Qty: 18 | Fill #0

## 2017-04-14 NOTE — Telephone Encounter (Signed)
Colon rescheduled at [facility] for 05/24/17@11 :30am. Pt aware and new instructions mailed to pt.

## 2017-04-14 NOTE — Telephone Encounter (Signed)
Misty states she is doing Pre op calls for [facility]. States she spoke with patient and she wants to reschedule appointment. Misty is asking [place] to take patient off of schedule.

## 2017-04-20 ENCOUNTER — Ambulatory Visit: Payer: Self-pay

## 2017-04-22 ENCOUNTER — Ambulatory Visit: Payer: Self-pay | Attending: Internal Medicine | Admitting: Internal Medicine

## 2017-04-22 ENCOUNTER — Encounter: Payer: Self-pay | Admitting: Internal Medicine

## 2017-04-22 VITALS — BP 106/75 | HR 78 | Temp 98.1°F | Resp 16 | Wt 319.8 lb

## 2017-04-22 DIAGNOSIS — I10 Essential (primary) hypertension: Secondary | ICD-10-CM

## 2017-04-22 DIAGNOSIS — E559 Vitamin D deficiency, unspecified: Secondary | ICD-10-CM | POA: Insufficient documentation

## 2017-04-22 DIAGNOSIS — E785 Hyperlipidemia, unspecified: Secondary | ICD-10-CM | POA: Insufficient documentation

## 2017-04-22 DIAGNOSIS — M1612 Unilateral primary osteoarthritis, left hip: Secondary | ICD-10-CM | POA: Insufficient documentation

## 2017-04-22 DIAGNOSIS — Z7982 Long term (current) use of aspirin: Secondary | ICD-10-CM | POA: Insufficient documentation

## 2017-04-22 DIAGNOSIS — Z7984 Long term (current) use of oral hypoglycemic drugs: Secondary | ICD-10-CM | POA: Insufficient documentation

## 2017-04-22 DIAGNOSIS — M48061 Spinal stenosis, lumbar region without neurogenic claudication: Secondary | ICD-10-CM | POA: Insufficient documentation

## 2017-04-22 DIAGNOSIS — F419 Anxiety disorder, unspecified: Secondary | ICD-10-CM

## 2017-04-22 DIAGNOSIS — F329 Major depressive disorder, single episode, unspecified: Secondary | ICD-10-CM

## 2017-04-22 DIAGNOSIS — Z79899 Other long term (current) drug therapy: Secondary | ICD-10-CM | POA: Insufficient documentation

## 2017-04-22 DIAGNOSIS — F32A Depression, unspecified: Secondary | ICD-10-CM

## 2017-04-22 DIAGNOSIS — Z6841 Body Mass Index (BMI) 40.0 and over, adult: Secondary | ICD-10-CM | POA: Insufficient documentation

## 2017-04-22 DIAGNOSIS — L600 Ingrowing nail: Secondary | ICD-10-CM

## 2017-04-22 DIAGNOSIS — N879 Dysplasia of cervix uteri, unspecified: Secondary | ICD-10-CM | POA: Insufficient documentation

## 2017-04-22 DIAGNOSIS — E119 Type 2 diabetes mellitus without complications: Secondary | ICD-10-CM

## 2017-04-22 DIAGNOSIS — G4733 Obstructive sleep apnea (adult) (pediatric): Secondary | ICD-10-CM | POA: Insufficient documentation

## 2017-04-22 DIAGNOSIS — G8929 Other chronic pain: Secondary | ICD-10-CM | POA: Insufficient documentation

## 2017-04-22 DIAGNOSIS — M4804 Spinal stenosis, thoracic region: Secondary | ICD-10-CM | POA: Insufficient documentation

## 2017-04-22 DIAGNOSIS — M549 Dorsalgia, unspecified: Secondary | ICD-10-CM | POA: Insufficient documentation

## 2017-04-22 DIAGNOSIS — F17210 Nicotine dependence, cigarettes, uncomplicated: Secondary | ICD-10-CM | POA: Insufficient documentation

## 2017-04-22 LAB — GLUCOSE, POCT (MANUAL RESULT ENTRY): POC GLUCOSE: 112 mg/dL — AB (ref 70–99)

## 2017-04-22 LAB — POCT GLYCOSYLATED HEMOGLOBIN (HGB A1C): HEMOGLOBIN A1C: 6.9

## 2017-04-22 MED ORDER — GLIMEPIRIDE 2 MG PO TABS
2.0000 mg | ORAL_TABLET | Freq: Every day | ORAL | 5 refills | Status: DC
Start: 2017-04-22 — End: 2017-10-20

## 2017-04-22 MED ORDER — METFORMIN HCL 500 MG PO TABS: 500.0000 mg | ORAL_TABLET | Freq: Every day | ORAL | 3 refills | Status: DC

## 2017-04-22 MED FILL — ?GLIMEPIRIDE 2 MG TABLET: 2 | 30 days supply | Qty: 30 | Fill #0

## 2017-04-22 NOTE — Patient Instructions (Signed)
Decrease Metformin to 500 mg daily.   Start Amaryl 2 mg once daily. Please check blood sugars daily for next 2-3 weeks.   I have submitted a referral for you to see podiatrist.

## 2017-04-22 NOTE — Progress Notes (Signed)
Patient ID: Tracy Fitzgerald, female    DOB: Nov 09, 1965  MRN: [id number]  CC: medication check; Diabetes; and Hypertension   Subjective: Tracy Fitzgerald is a 52 y.o. female who presents for   chronic disease management.  Husband is with her Her concerns today include:  52 year old with history of diabetes 2, tobacco dependence, radicular lower back pain (mod. LT foraminal stenosis and small central disc bulge at T12-L1 on MRI 05/2015), HTN, anxiety/depression, obesity, abnormal Pap, OSA, and  hyperlipidemia  1.  Dep/Anxiety: Followed by Beverly Sessions.  She has been placed on 2 new medications: Abilify and hydroxyzine 25 mg 3 times daily as needed.  The hydroxyzine causes dryness of the mouth.  She plans to speak with the psychiatrist about it on her next visit  2.  DM: On last visit metformin was increased to thousand milligrams in the a.m. and 500 in the p.m.  She reports significant diarrhea with this increased dose.  She has had close calls of almost incontinence  -She is trying to avoid emotional eating. Not getting in much exercise due to chronic back pain  3.  Colon cancer screening: Colonoscopy planned for next month.  4.  History of abnormal Pap smear.  She saw gynecology since last visit.  Reportedly had neg ECC Patient Active Problem List   Diagnosis Date Noted  . Hyperlipidemia, unspecified 10/10/2016  . Radicular low back pain 08/12/2016  . Anxiety and depression 08/12/2016  . Class 3 severe obesity due to excess calories with serious comorbidity and body mass index (BMI) of 50.0 to 59.9 in adult ([place]) 08/12/2016  . New onset type 2 diabetes mellitus ([place]) 11/25/2015  . Severe dysplasia of cervix (CIN III) 06/25/2015  . Hemorrhoid 06/20/2015  . Perimenopausal 06/20/2015  . Osteoarthritis of spine with radiculopathy, lumbar region 05/29/2015  . Primary osteoarthritis of left hip 05/29/2015  . Vitamin D insufficiency 11/13/2014  . Diastolic CHF ([place]) 69/48/5462  . Numbness of foot  11/12/2014  . OSA (obstructive sleep apnea) 11/12/2014  . Tobacco abuse 06/17/2014  . Essential hypertension 06/17/2014     Current Outpatient Medications on File Prior to Visit  Medication Sig Dispense Refill  . ARIPiprazole (ABILIFY) 5 MG tablet Take 5 mg by mouth daily.    . hydrOXYzine (VISTARIL) 25 MG capsule Take 25 mg by mouth every 8 (eight) hours as needed.    Marland Kitchen albuterol (VENTOLIN HFA) 108 (90 Base) MCG/ACT inhaler INHALE 2 PUFFS INTO THE LUNGS EVERY 4 HOURS AS NEEDED FOR WHEEZING OR SHORTNESS OF BREATH. 54 g 0  . aspirin 325 MG EC tablet Take 325 mg by mouth every 6 (six) hours as needed for pain.    Marland Kitchen aspirin 81 MG EC tablet Take 1 tablet (81 mg total) by mouth daily. 90 tablet 3  . Aspirin-Caffeine 845-65 MG PACK Take by mouth.    Marland Kitchen atorvastatin (LIPITOR) 10 MG tablet TAKE 1 TABLET BY MOUTH DAILY. 90 tablet 0  . Blood Glucose Monitoring Suppl (TRUE METRIX METER) w/Device KIT Use as directed 1 kit 0  . buPROPion (WELLBUTRIN SR) 150 MG 12 hr tablet TAKE 1 TABLET BY MOUTH 2 TIMES DAILY. 60 tablet 2  . chlorthalidone (HYGROTON) 25 MG tablet TAKE 1 TABLET BY MOUTH EVERY MORNING. 30 tablet 2  . Cholecalciferol (VITAMIN D PO) Take by mouth.    . gabapentin (NEURONTIN) 300 MG capsule Take 2 capsules (600 mg total) by mouth 4 (four) times daily. 240 capsule 4  . glucose blood (TRUE METRIX BLOOD  GLUCOSE TEST) test strip Use as instructed 100 each 12  . hydrOXYzine (ATARAX/VISTARIL) 25 MG tablet Take 25 mg by mouth 3 (three) times daily as needed.    Marland Kitchen Hyoscyamine Sulfate SL (LEVSIN/SL) 0.125 MG SUBL Place 0.125 mg under the tongue every 4 (four) hours as needed. 40 each 0  . ibuprofen (ADVIL,MOTRIN) 800 MG tablet Take 1 tablet (800 mg total) by mouth every 8 (eight) hours as needed for moderate pain. (Patient taking differently: Take 800 mg by mouth every 6 (six) hours as needed for moderate pain. ) 30 tablet 3  . lisinopril (PRINIVIL,ZESTRIL) 10 MG tablet TAKE 1 TABLET BY MOUTH DAILY. 90  tablet 0  . Loperamide HCl (IMODIUM PO) Take 1 tablet by mouth daily.    . metoprolol tartrate (LOPRESSOR) 25 MG tablet Take 1 tablet (25 mg total) by mouth 2 (two) times daily. 180 tablet 3  . Na Sulfate-K Sulfate-Mg Sulf 17.5-3.13-1.6 GM/177ML SOLN Suprep (no substitutions)-TAKE AS DIRECTED. Exp. 7/20 354 mL 0  . sertraline (ZOLOFT) 100 MG tablet Take 1.5 tablets (150 mg total) by mouth daily. 45 tablet 6  . TRUEPLUS LANCETS 28G MISC Use as directed 100 each 2   No current facility-administered medications on file prior to visit.     No Known Allergies  Social History   Socioeconomic History  . Marital status: Married    Spouse name: Not on file  . Number of children: Not on file  . Years of education: Not on file  . Highest education level: Not on file  Social Needs  . Financial resource strain: Not on file  . Food insecurity - worry: Not on file  . Food insecurity - inability: Not on file  . Transportation needs - medical: Not on file  . Transportation needs - non-medical: Not on file  Occupational History  . Not on file  Tobacco Use  . Smoking status: Current Every Day Smoker    Packs/day: 1.00    Years: 33.00    Pack years: 33.00    Types: Cigarettes  . Smokeless tobacco: Current User  Substance and Sexual Activity  . Alcohol use: Yes    Comment: occ. monthly  . Drug use: No  . Sexual activity: Yes    Partners: Male    Birth control/protection: Post-menopausal  Other Topics Concern  . Not on file  Social History Narrative  . Not on file    Family History  Problem Relation Age of Onset  . Hypertension Mother   . Diabetes Mother   . Uterine cancer Mother   . Heart disease Mother   . Cancer Mother        UTERINE CANCER   . Arthritis Mother   . Hypertension Father   . Kidney disease Father   . Breast cancer Neg Hx   . Colon cancer Neg Hx     Past Surgical History:  Procedure Laterality Date  . ADENOIDECTOMY    . TONSILLECTOMY      ROS: Review of  Systems Painful ingrown toenail on right big toe.  Would like to have it remove  PHYSICAL EXAM: BP 106/75   Pulse 78   Temp 98.1 F (36.7 C) (Oral)   Resp 16   Wt (!) 319 lb 12.8 oz (145.1 kg)   LMP 11/23/2015   SpO2 96%   BMI 51.62 kg/m   Physical Exam  General appearance - alert, well appearing, and in no distress Mental status - alert, oriented to person, place, and  time, normal mood, behavior, speech, dress, motor activity, and thought processes Chest - clear to auscultation, no wheezes, rales or rhonchi, symmetric air entry Heart - normal rate, regular rhythm, normal S1, S2, no murmurs, rubs, clicks or gallops Extremities - peripheral pulses normal, no pedal edema, no clubbing or cyanosis RT foot:  ingrown toenail of big toe.  No signs of infection  Results for orders placed or performed in visit on 04/22/17  POCT glycosylated hemoglobin (Hb A1C)  Result Value Ref Range   Hemoglobin A1C 6.9   POCT glucose (manual entry)  Result Value Ref Range   POC Glucose 112 (A) 70 - 99 mg/dl    ASSESSMENT AND PLAN: 1. Controlled type 2 diabetes mellitus without complication, without long-term current use of insulin (HCC) Given diarrhea that she is having with metformin.  We agreed to decrease metformin to just 500 mg once a day.  Will add Amaryl.  Patient advised to check blood sugars daily over the next 2-3 weeks given the medication changes - POCT glycosylated hemoglobin (Hb A1C) - POCT glucose (manual entry) - glimepiride (AMARYL) 2 MG tablet; Take 1 tablet (2 mg total) by mouth daily before breakfast.  Dispense: 30 tablet; Refill: 5 - metFORMIN (GLUCOPHAGE) 500 MG tablet; Take 1 tablet (500 mg total) by mouth daily with breakfast.  Dispense: 90 tablet; Refill: 3  2. Ingrown toenail of right foot - Ambulatory referral to Podiatry  3. Essential hypertension At goal.  4. Anxiety and depression Followed by Beverly Sessions.  Abilify and hydroxyzine added to med list.  Patient was  given the opportunity to ask questions.  Patient verbalized understanding of the plan and was able to repeat key elements of the plan.   Orders Placed This Encounter  Procedures  . Ambulatory referral to Podiatry  . POCT glycosylated hemoglobin (Hb A1C)  . POCT glucose (manual entry)     Requested Prescriptions   Signed Prescriptions Disp Refills  . glimepiride (AMARYL) 2 MG tablet 30 tablet 5    Sig: Take 1 tablet (2 mg total) by mouth daily before breakfast.  . metFORMIN (GLUCOPHAGE) 500 MG tablet 90 tablet 3    Sig: Take 1 tablet (500 mg total) by mouth daily with breakfast.    Return in about 3 months (around 07/20/2017).  Karle Plumber, MD, FACP

## 2017-05-03 ENCOUNTER — Other Ambulatory Visit: Payer: Self-pay | Admitting: Internal Medicine

## 2017-05-03 DIAGNOSIS — I10 Essential (primary) hypertension: Secondary | ICD-10-CM

## 2017-05-03 MED FILL — LISINOPRIL 10 MG TABS: 10 | 30 days supply | Qty: 30 | Fill #2

## 2017-05-03 MED FILL — ?ATORVASTATIN 10 MG TABLET: 10 | 30 days supply | Qty: 30 | Fill #2

## 2017-05-03 MED FILL — ?METOPROLOL 25 MG TABLET: 25 | 30 days supply | Qty: 60 | Fill #5

## 2017-05-03 MED FILL — !VENTOLIN HFA INHALER: 108 (90 BAS | 16 days supply | Qty: 18 | Fill #1

## 2017-05-03 MED FILL — ?GLIMEPIRIDE 2 MG TABLET: 2 | 30 days supply | Qty: 30 | Fill #1

## 2017-05-03 MED FILL — GABAPENTIN 300 MG CAPSULE: 300 | 30 days supply | Qty: 240 | Fill #1

## 2017-05-03 MED FILL — ?METFORMIN HCL 500MG TABLET: 500 | 30 days supply | Qty: 90 | Fill #3

## 2017-05-04 MED FILL — ?CHLORTHALIDONE 25 MG TABLE: 25 | 30 days supply | Qty: 30 | Fill #0

## 2017-05-18 ENCOUNTER — Encounter (HOSPITAL_COMMUNITY): Payer: Self-pay | Admitting: Emergency Medicine

## 2017-05-18 ENCOUNTER — Other Ambulatory Visit: Payer: Self-pay

## 2017-05-20 ENCOUNTER — Ambulatory Visit: Payer: Self-pay | Attending: Internal Medicine

## 2017-05-24 ENCOUNTER — Other Ambulatory Visit: Payer: Self-pay

## 2017-05-24 ENCOUNTER — Ambulatory Visit (HOSPITAL_COMMUNITY): Payer: Self-pay | Admitting: Registered Nurse

## 2017-05-24 ENCOUNTER — Encounter (HOSPITAL_COMMUNITY)
Admission: RE | Disposition: A | Discharge status: Home / Self Care | Payer: Self-pay | Source: Ambulatory Visit | Attending: Internal Medicine

## 2017-05-24 ENCOUNTER — Encounter (HOSPITAL_COMMUNITY): Payer: Self-pay | Admitting: *Deleted

## 2017-05-24 ENCOUNTER — Ambulatory Visit (HOSPITAL_COMMUNITY)
Admission: RE | Admit: 2017-05-24 | Discharge: 2017-05-24 | Disposition: A | Discharge status: Home / Self Care | Payer: Self-pay | Source: Ambulatory Visit | Attending: Internal Medicine | Admitting: Internal Medicine

## 2017-05-24 DIAGNOSIS — I10 Essential (primary) hypertension: Secondary | ICD-10-CM | POA: Insufficient documentation

## 2017-05-24 DIAGNOSIS — Z1211 Encounter for screening for malignant neoplasm of colon: Secondary | ICD-10-CM | POA: Insufficient documentation

## 2017-05-24 DIAGNOSIS — G4730 Sleep apnea, unspecified: Secondary | ICD-10-CM | POA: Insufficient documentation

## 2017-05-24 DIAGNOSIS — Z9989 Dependence on other enabling machines and devices: Secondary | ICD-10-CM | POA: Insufficient documentation

## 2017-05-24 DIAGNOSIS — K5730 Diverticulosis of large intestine without perforation or abscess without bleeding: Secondary | ICD-10-CM | POA: Insufficient documentation

## 2017-05-24 DIAGNOSIS — D128 Benign neoplasm of rectum: Secondary | ICD-10-CM | POA: Insufficient documentation

## 2017-05-24 DIAGNOSIS — F17210 Nicotine dependence, cigarettes, uncomplicated: Secondary | ICD-10-CM | POA: Insufficient documentation

## 2017-05-24 DIAGNOSIS — K648 Other hemorrhoids: Secondary | ICD-10-CM | POA: Insufficient documentation

## 2017-05-24 DIAGNOSIS — E119 Type 2 diabetes mellitus without complications: Secondary | ICD-10-CM | POA: Insufficient documentation

## 2017-05-24 DIAGNOSIS — Z6841 Body Mass Index (BMI) 40.0 and over, adult: Secondary | ICD-10-CM | POA: Insufficient documentation

## 2017-05-24 HISTORY — PX: COLONOSCOPY WITH PROPOFOL: SHX5780

## 2017-05-24 LAB — GLUCOSE, CAPILLARY: Glucose-Capillary: 139 mg/dL — ABNORMAL HIGH (ref 65–99)

## 2017-05-24 SURGERY — COLONOSCOPY WITH PROPOFOL
Anesthesia: Monitor Anesthesia Care

## 2017-05-24 MED ORDER — PROPOFOL 10 MG/ML IV BOLUS
INTRAVENOUS | Status: DC | PRN
  Administered 2017-05-24: 20 mg via INTRAVENOUS

## 2017-05-24 MED ORDER — LACTATED RINGERS IV SOLN
INTRAVENOUS | Status: DC
  Administered 2017-05-24: 11:00:00 via INTRAVENOUS

## 2017-05-24 MED ORDER — PROPOFOL 500 MG/50ML IV EMUL
INTRAVENOUS | Status: DC | PRN
  Administered 2017-05-24: 120 ug/kg/min via INTRAVENOUS

## 2017-05-24 MED ORDER — LIDOCAINE 2% (20 MG/ML) 5 ML SYRINGE
INTRAMUSCULAR | Status: DC | PRN
  Administered 2017-05-24: 100 mg via INTRAVENOUS

## 2017-05-24 MED ORDER — PROPOFOL 10 MG/ML IV BOLUS
INTRAVENOUS | Status: AC
  Filled 2017-05-24: qty 60

## 2017-05-24 MED ORDER — SODIUM CHLORIDE 0.9 % IV SOLN: INTRAVENOUS | Status: DC

## 2017-05-24 SURGICAL SUPPLY — 22 items

## 2017-05-24 NOTE — Anesthesia Preprocedure Evaluation (Signed)
Anesthesia Evaluation  Patient identified by MRN, date of birth, ID band Patient awake    Reviewed: Allergy & Precautions, NPO status , Patient's Chart, lab work & pertinent test results  Airway Mallampati: II  TM Distance: <3 FB Neck ROM: Full    Dental no notable dental hx.    Pulmonary sleep apnea , Current Smoker,    Pulmonary exam normal breath sounds clear to auscultation + decreased breath sounds      Cardiovascular hypertension, Normal cardiovascular exam Rhythm:Regular Rate:Normal     Neuro/Psych negative neurological ROS  negative psych ROS   GI/Hepatic negative GI ROS, Neg liver ROS,   Endo/Other  diabetesMorbid obesity  Renal/GU negative Renal ROS  negative genitourinary   Musculoskeletal negative musculoskeletal ROS (+)   Abdominal (+) + obese,   Peds negative pediatric ROS (+)  Hematology negative hematology ROS (+)   Anesthesia Other Findings   Reproductive/Obstetrics negative OB ROS                             Anesthesia Physical Anesthesia Plan  ASA: III  Anesthesia Plan: MAC   Post-op Pain Management:    Induction: Intravenous  PONV Risk Score and Plan: 0  Airway Management Planned: Simple Face Mask  Additional Equipment:   Intra-op Plan:   Post-operative Plan:   Informed Consent: I have reviewed the patients History and Physical, chart, labs and discussed the procedure including the risks, benefits and alternatives for the proposed anesthesia with the patient or authorized representative who has indicated his/her understanding and acceptance.   Dental advisory given  Plan Discussed with: CRNA and Surgeon  Anesthesia Plan Comments:         Anesthesia Quick Evaluation

## 2017-05-24 NOTE — Discharge Instructions (Signed)

## 2017-05-24 NOTE — Anesthesia Postprocedure Evaluation (Signed)
Anesthesia Post Note  Patient: Tracy Fitzgerald  Procedure(s) Performed: COLONOSCOPY WITH PROPOFOL (N/A )     Patient location during evaluation: PACU Anesthesia Type: MAC Level of consciousness: awake and alert Pain management: pain level controlled Vital Signs Assessment: post-procedure vital signs reviewed and stable Respiratory status: spontaneous breathing, nonlabored ventilation, respiratory function stable and patient connected to nasal cannula oxygen Cardiovascular status: stable and blood pressure returned to baseline Postop Assessment: no apparent nausea or vomiting Anesthetic complications: no    Last Vitals:  Vitals:   05/24/17 1146 05/24/17 1150  BP: 106/70 (!) 112/46  Pulse: 83 77  Resp: 18 20  Temp: (!) 36.4 C   SpO2: 100% 98%    Last Pain:  Vitals:   05/24/17 1150  TempSrc:   PainSc: 0-No pain                 [name] S

## 2017-05-24 NOTE — Op Note (Signed)
[facility] Patient Name: Tracy Fitzgerald Procedure Date: 05/24/2017 MRN: [id number] Attending MD: Jerene Bears , MD Date of Birth: 06-01-1965 CSN: [id number] Age: 52 Admit Type: Outpatient Procedure:                Colonoscopy Indications:              Screening for colorectal malignant neoplasm, This                            is the patient's first colonoscopy Providers:                Lajuan Lines. Hilarie Fredrickson, MD, Kingsley Plan, RN, Charolette Child, Technician, Courtney Heys Arm[place], CRNA Referring MD:             Ladell Pier Medicines:                Monitored Anesthesia Care Complications:            No immediate complications. Estimated Blood Loss:     Estimated blood loss was minimal. Procedure:                Pre-Anesthesia Assessment:                           - Prior to the procedure, a History and Physical                            was performed, and patient medications and                            allergies were reviewed. The patient's tolerance of                            previous anesthesia was also reviewed. The risks                            and benefits of the procedure and the sedation                            options and risks were discussed with the patient.                            All questions were answered, and informed consent                            was obtained. Prior Anticoagulants: The patient has                            taken no previous anticoagulant or antiplatelet                            agents. ASA Grade Assessment: III - A patient with  severe systemic disease. After reviewing the risks                            and benefits, the patient was deemed in                            satisfactory condition to undergo the procedure.                           After obtaining informed consent, the colonoscope                            was passed under direct vision. Throughout  the                            procedure, the patient's blood pressure, pulse, and                            oxygen saturations were monitored continuously. The                            Colonoscope was introduced through the anus and                            advanced to the the cecum, identified by                            appendiceal orifice and ileocecal valve. The                            colonoscopy was performed without difficulty. The                            patient tolerated the procedure well. The quality                            of the bowel preparation was good. The ileocecal                            valve, appendiceal orifice, and rectum were                            photographed. Scope In: 11:26:51 AM Scope Out: 11:38:05 AM Scope Withdrawal Time: 0 hours 8 minutes 8 seconds  Total Procedure Duration: 0 hours 11 minutes 14 seconds  Findings:      The digital rectal exam was normal.      A 6 mm polyp was found in the rectum. The polyp was sessile. The polyp       was removed with a cold snare. Resection and retrieval were complete.      A few small-mouthed diverticula were found in the descending colon.      Internal hemorrhoids were found during retroflexion. The hemorrhoids       were small.      The exam was otherwise without abnormality. Impression:               -  One 6 mm polyp in the rectum, removed with a cold                            snare. Resected and retrieved.                           - A few small diverticula in the descending colon.                           - Internal hemorrhoids.                           - The examination was otherwise normal. Moderate Sedation:      N/A Recommendation:           - Patient has a contact number available for                            emergencies. The signs and symptoms of potential                            delayed complications were discussed with the                            patient. Return to  normal activities tomorrow.                            Written discharge instructions were provided to the                            patient.                           - Resume previous diet.                           - Continue present medications.                           - Await pathology results.                           - Repeat colonoscopy is recommended. The                            colonoscopy date will be determined after pathology                            results from today's exam become available for                            review. Procedure Code(s):        --- Professional ---                           [phone], Colonoscopy, flexible; with removal of  tumor(s), polyp(s), or other lesion(s) by snare                            technique Diagnosis Code(s):        --- Professional ---                           Z12.11, Encounter for screening for malignant                            neoplasm of colon                           K62.1, Rectal polyp                           K64.8, Other hemorrhoids                           K57.30, Diverticulosis of large intestine without                            perforation or abscess without bleeding CPT copyright 2016 American Medical Association. All rights reserved. The codes documented in this report are preliminary and upon coder review may  be revised to meet current compliance requirements. Jerene Bears, MD 05/24/2017 11:45:55 AM This report has been signed electronically. Number of Addenda: 0

## 2017-05-24 NOTE — Transfer of Care (Signed)
Immediate Anesthesia Transfer of Care Note  Patient: Tracy Fitzgerald  Procedure(s) Performed: COLONOSCOPY WITH PROPOFOL (N/A )  Patient Location: PACU and Endoscopy Unit  Anesthesia Type:MAC  Level of Consciousness: awake, alert , oriented and patient cooperative  Airway & Oxygen Therapy: Patient Spontanous Breathing and Patient connected to face mask oxygen  Post-op Assessment: Report given to RN, Post -op Vital signs reviewed and stable and Patient moving all extremities  Post vital signs: Reviewed and stable  Last Vitals:  Vitals:   05/24/17 1105  BP: 99/68  Pulse: 74  Resp: 16  Temp: 36.7 C  SpO2: 94%    Last Pain:  Vitals:   05/24/17 1105  TempSrc: Oral         Complications: No apparent anesthesia complications

## 2017-05-24 NOTE — H&P (Signed)
HPI: Tracy Fitzgerald is a 52 year old female with a history of diabetes, hypertension, obesity, sleep apnea who presents for outpatient screening colonoscopy.  No GI complaint other than intermittent bright red blood per rectum associated with bowel movement.  She attributes this to hemorrhoids.  No prior colonoscopy.  No family history of colon cancer.  Denies upper GI and hepatobiliary complaint  Past Medical History:  Diagnosis Date  . Asthma   . CHF (congestive heart failure) ([place])   . Depression   . Diabetes mellitus without complication ([place])   . Headache   . Hypertension   . Pain disorder   . Sleep apnea    CPAP    Past Surgical History:  Procedure Laterality Date  . ADENOIDECTOMY    . TONSILLECTOMY       (Not in an outpatient encounter)  No Known Allergies  Family History  Problem Relation Age of Onset  . Hypertension Mother   . Diabetes Mother   . Uterine cancer Mother   . Heart disease Mother   . Cancer Mother        UTERINE CANCER   . Arthritis Mother   . Hypertension Father   . Kidney disease Father   . Breast cancer Neg Hx   . Colon cancer Neg Hx     Social History   Tobacco Use  . Smoking status: Current Every Day Smoker    Packs/day: 1.00    Years: 33.00    Pack years: 33.00    Types: Cigarettes  . Smokeless tobacco: Current User  Substance Use Topics  . Alcohol use: Yes    Comment: occ. monthly  . Drug use: No    ROS: As per history of present illness, otherwise negative  BP 99/68   Pulse 74   Temp 98.1 F (36.7 C) (Oral)   Resp 16   Ht 5\' 6"  (1.676 m)   Wt (!) 319 lb (144.7 kg)   LMP 11/23/2015   SpO2 94%   BMI 51.49 kg/m  Gen: awake, alert, NAD HEENT: anicteric, op clear CV: RRR, no mrg Pulm: CTA b/l Abd: soft, NT/ND, +BS throughout Ext: no c/c/e Neuro: nonfocal  RELEVANT LABS AND IMAGING: CBC    Component Value Date/Time   WBC 10.5 06/25/2016 2237   RBC 4.43 06/25/2016 2237   HGB 14.6 06/25/2016 2237   HCT 43.8  06/25/2016 2237   PLT 267 06/25/2016 2237   MCV 98.9 06/25/2016 2237   MCH 33.0 06/25/2016 2237   MCHC 33.3 06/25/2016 2237   RDW 13.0 06/25/2016 2237   LYMPHSABS 1.8 07/10/2007 1525   MONOABS 0.9 07/10/2007 1525   EOSABS 0.4 07/10/2007 1525   BASOSABS 0.1 07/10/2007 1525    CMP     Component Value Date/Time   NA 140 01/11/2017 1042   K 4.4 01/11/2017 1042   CL 98 01/11/2017 1042   CO2 23 01/11/2017 1042   GLUCOSE 134 (H) 01/11/2017 1042   GLUCOSE 239 (H) 06/25/2016 2237   BUN 19 01/11/2017 1042   CREATININE 1.05 (H) 01/11/2017 1042   CREATININE 0.93 11/25/2015 1003   CALCIUM 10.6 (H) 01/11/2017 1042   PROT 6.7 08/12/2016 1214   ALBUMIN 4.1 08/12/2016 1214   AST 24 08/12/2016 1214   ALT 44 (H) 08/12/2016 1214   ALKPHOS 98 08/12/2016 1214   BILITOT 0.4 08/12/2016 1214   GFRNONAA 62 01/11/2017 1042   GFRNONAA 72 11/25/2015 1003   GFRAA 71 01/11/2017 1042   GFRAA 83 11/25/2015 1003  ASSESSMENT/PLAN: 52 year old female with a history of diabetes, hypertension, obesity, sleep apnea who presents for outpatient screening colonoscopy.    1.  Colon cancer screening --average risk colonoscopy in the outpatient hospital setting with monitored anesthesia care due to obesity.  The nature of the procedure, as well as the risks, benefits, and alternatives were carefully and thoroughly reviewed with the patient. Ample time for discussion and questions allowed. The patient understood, was satisfied, and agreed to proceed.       Cc:No referring provider defined for this encounter.

## 2017-05-26 ENCOUNTER — Encounter (HOSPITAL_COMMUNITY): Payer: Self-pay | Admitting: Internal Medicine

## 2017-05-26 ENCOUNTER — Encounter: Payer: Self-pay | Admitting: Internal Medicine

## 2017-06-16 ENCOUNTER — Other Ambulatory Visit: Payer: Self-pay | Admitting: Internal Medicine

## 2017-06-16 DIAGNOSIS — I10 Essential (primary) hypertension: Secondary | ICD-10-CM

## 2017-06-16 MED FILL — GABAPENTIN 300 MG CAPSULE: 300 | 30 days supply | Qty: 240 | Fill #2

## 2017-06-16 MED FILL — ATORVASTATIN 10 MG TABLET: 10 | 30 days supply | Qty: 30 | Fill #0

## 2017-06-16 MED FILL — ?CHLORTHALIDONE 25 MG TABLE: 25 | 30 days supply | Qty: 30 | Fill #1

## 2017-06-16 MED FILL — ?GLIMEPIRIDE 2 MG TABLET: 2 | 30 days supply | Qty: 30 | Fill #2

## 2017-06-16 MED FILL — ?METOPROLOL 25 MG TABLET: 25 | 30 days supply | Qty: 60 | Fill #6

## 2017-06-17 ENCOUNTER — Other Ambulatory Visit: Payer: Self-pay | Admitting: Pharmacist

## 2017-06-17 DIAGNOSIS — I10 Essential (primary) hypertension: Secondary | ICD-10-CM

## 2017-06-17 MED ORDER — LISINOPRIL 10 MG PO TABS: 10.0000 mg | ORAL_TABLET | Freq: Every day | ORAL | 0 refills | Status: DC

## 2017-06-17 MED FILL — LISINOPRIL 10 MG TABS: 10 | 30 days supply | Qty: 30 | Fill #0

## 2017-07-15 ENCOUNTER — Ambulatory Visit: Payer: No Typology Code available for payment source | Admitting: Podiatry

## 2017-07-18 MED FILL — ?CHLORTHALIDONE 25 MG TABLE: 25 | 30 days supply | Qty: 30 | Fill #2

## 2017-07-18 MED FILL — ?METOPROLOL TARTRATE 25 MG: 25 | 30 days supply | Qty: 60 | Fill #7

## 2017-07-18 MED FILL — GABAPENTIN 300 MG CAPSULE: 300 | 30 days supply | Qty: 240 | Fill #3

## 2017-07-18 MED FILL — ?ATORVASTATIN 10MG TABLET: 10 | 30 days supply | Qty: 30 | Fill #1

## 2017-07-18 MED FILL — GLIMEPIRIDE 2 MG TABS: 2 | 30 days supply | Qty: 30 | Fill #3

## 2017-07-18 MED FILL — LISINOPRIL 10 MG TABS: 10 | 30 days supply | Qty: 30 | Fill #1

## 2017-07-19 ENCOUNTER — Ambulatory Visit: Payer: No Typology Code available for payment source | Admitting: Podiatry

## 2017-07-21 ENCOUNTER — Ambulatory Visit: Payer: Self-pay | Admitting: Internal Medicine

## 2017-07-21 MED FILL — !VENTOLIN HFA INHALER: 108 (90 BAS | 16 days supply | Qty: 18 | Fill #2

## 2017-08-12 IMAGING — CR DG HIP (WITH OR WITHOUT PELVIS) 2-3V*L*
3 series · 3 of 3 positions shown · non-contrast
Comparison: 06/17/2014.

CLINICAL DATA: Left hip pain. No known injury. Initial evaluation.

EXAM:
DG HIP (WITH OR WITHOUT PELVIS) 2-3V LEFT

[t pelvis a.p. *]
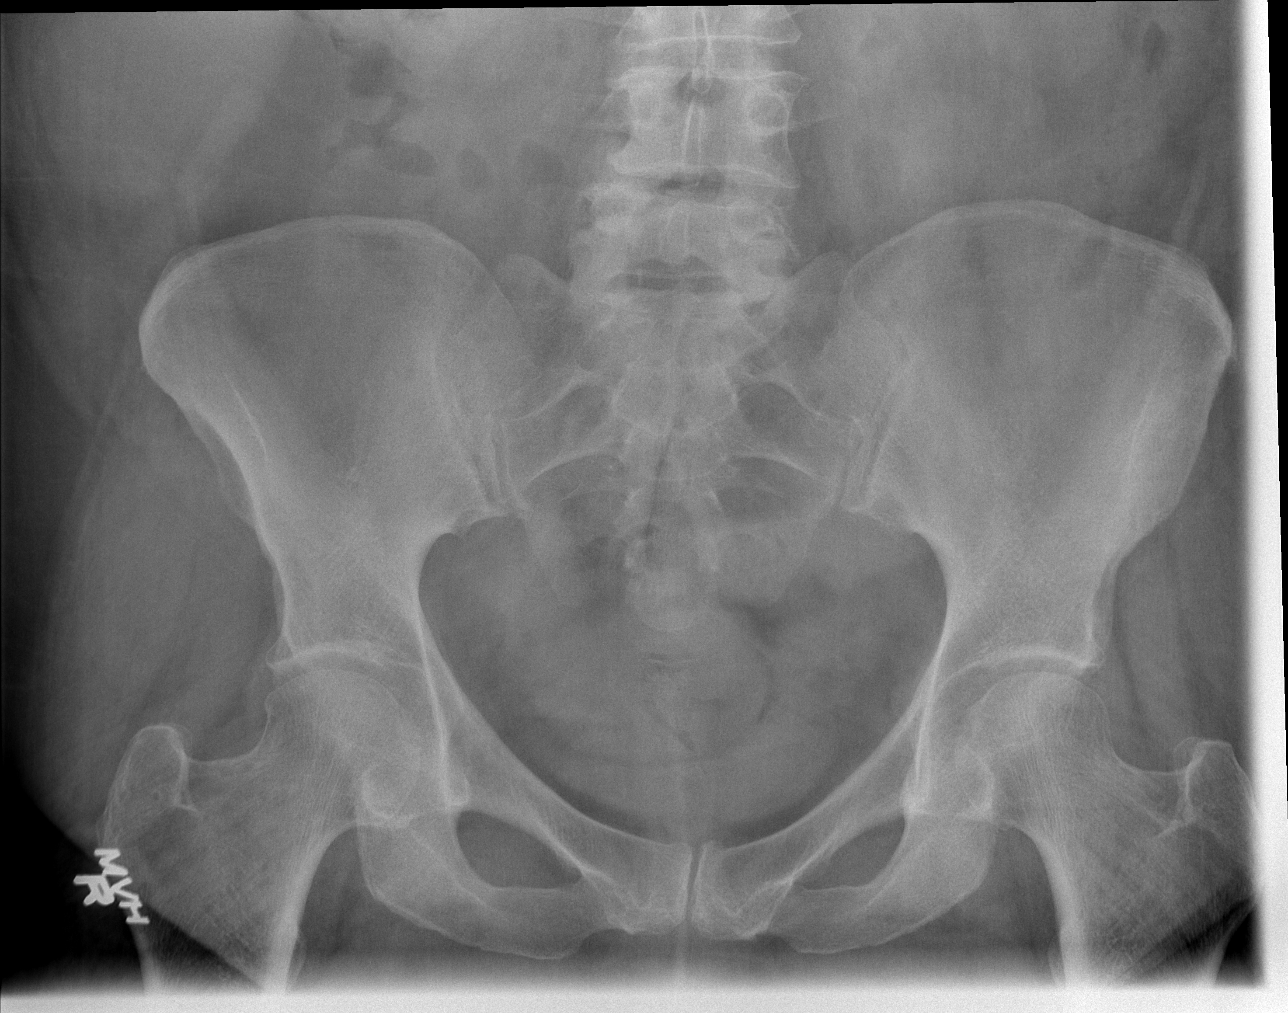

[t hip ap left *]
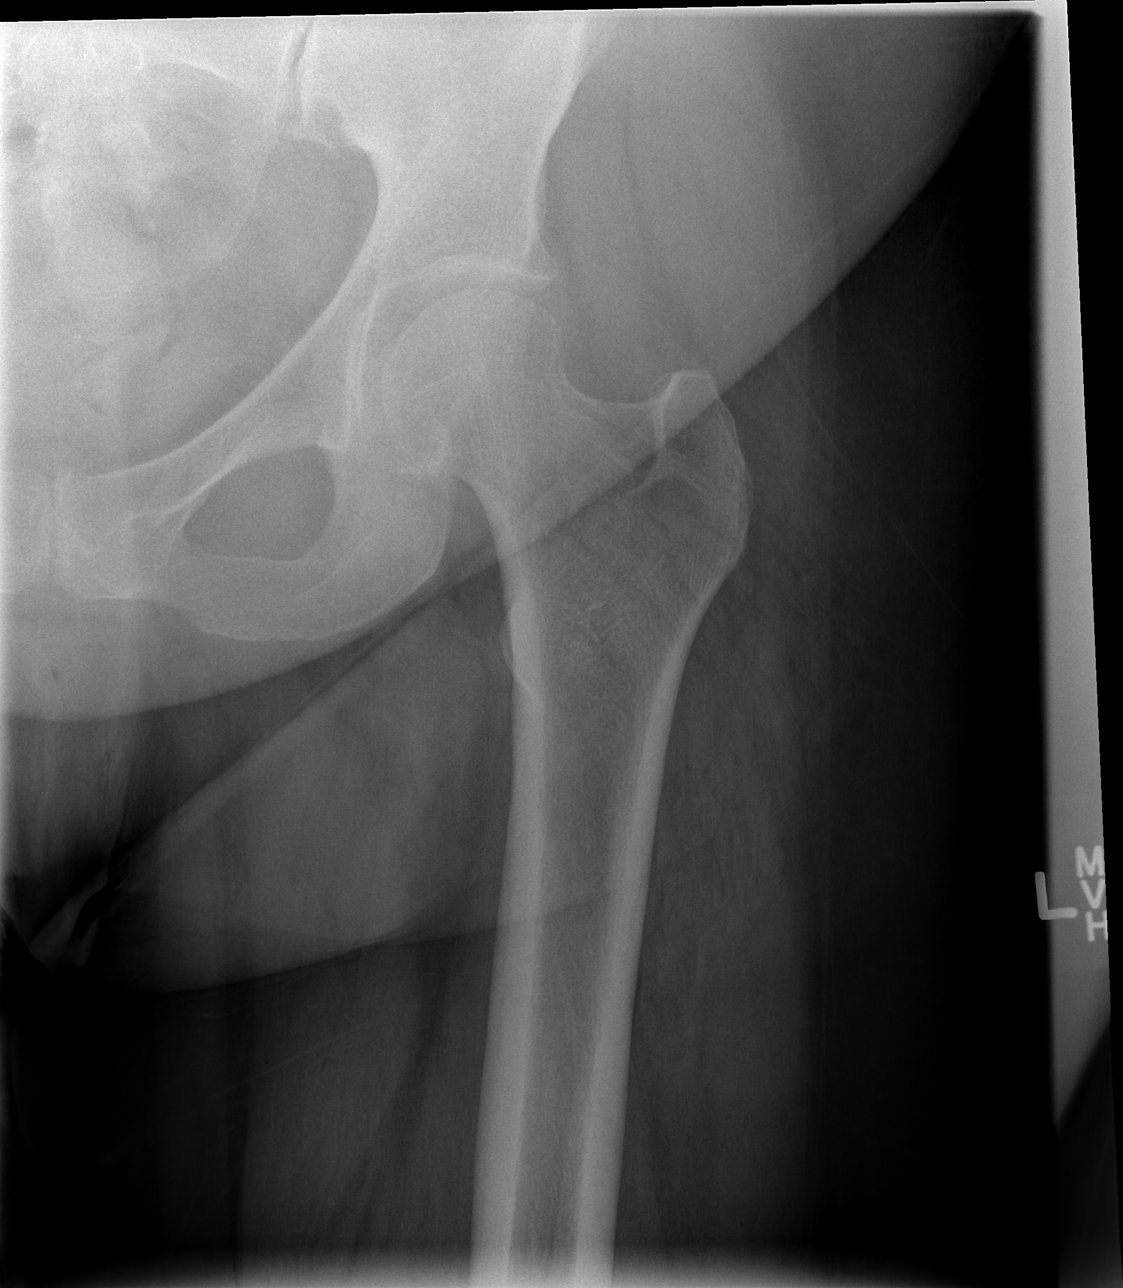

[t hip frog leg left]
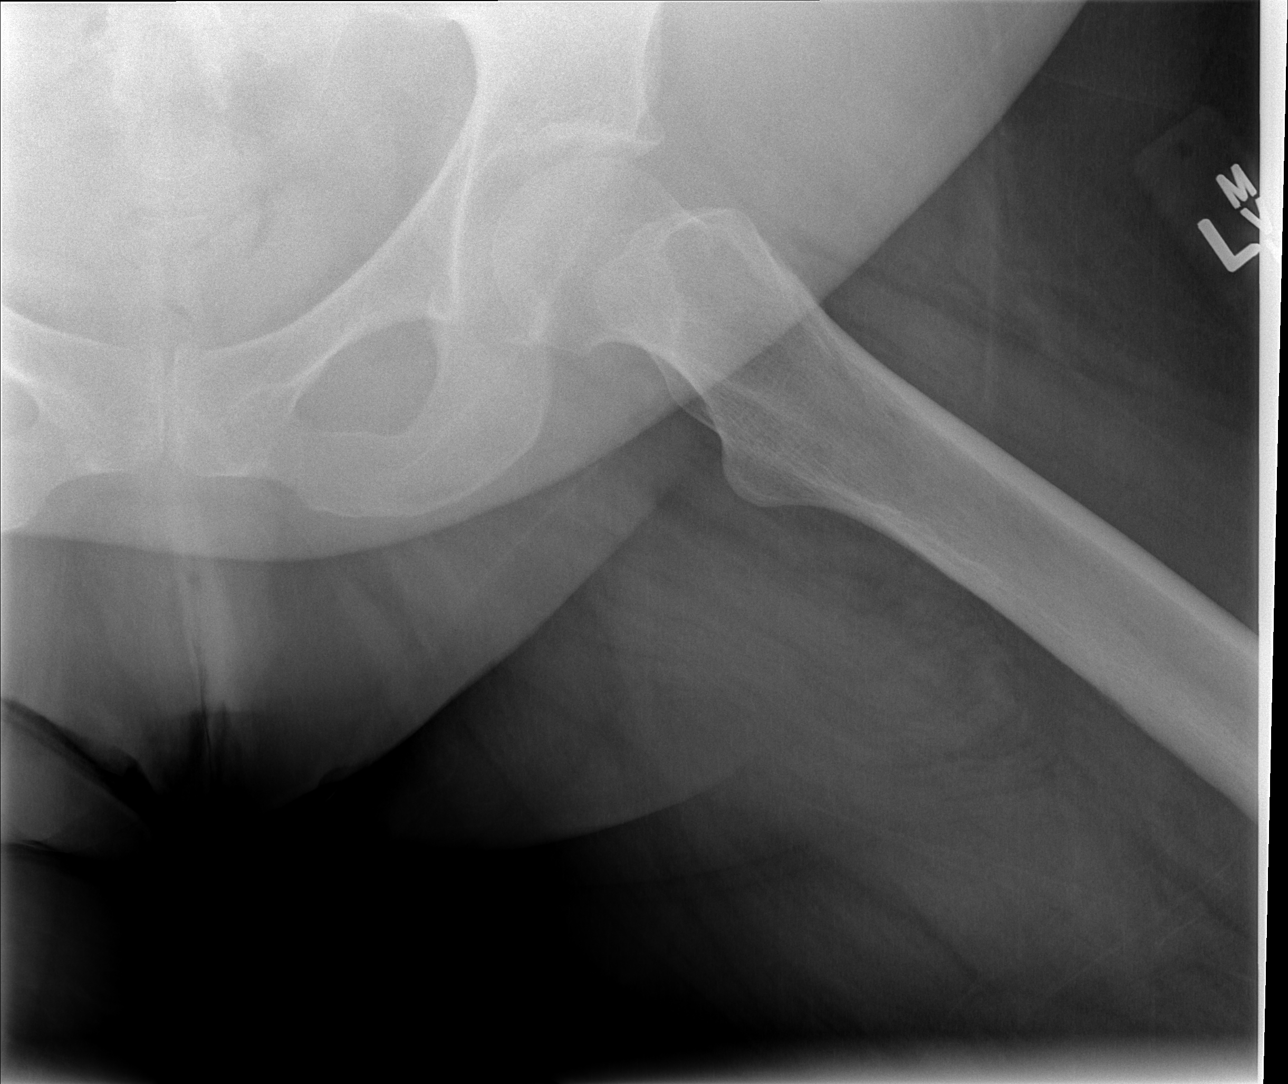

[3 of 3 positions shown; findings below may reference images not displayed]

FINDINGS: Mild degenerative changes lumbar spine and both hips. No acute bony
abnormality identified. No evidence of fracture or dislocation.
IMPRESSION: No acute abnormality.

## 2017-08-24 ENCOUNTER — Other Ambulatory Visit: Payer: Self-pay | Admitting: Internal Medicine

## 2017-08-24 DIAGNOSIS — I10 Essential (primary) hypertension: Secondary | ICD-10-CM

## 2017-08-26 ENCOUNTER — Other Ambulatory Visit: Payer: Self-pay | Admitting: Internal Medicine

## 2017-08-26 DIAGNOSIS — R0602 Shortness of breath: Secondary | ICD-10-CM

## 2017-09-13 MED FILL — GABAPENTIN 300 MG CAPSULE: 300 | 30 days supply | Qty: 240 | Fill #4

## 2017-09-13 MED FILL — GLIMEPIRIDE 2 MG TABS: 2 | 30 days supply | Qty: 30 | Fill #4

## 2017-09-13 MED FILL — ?CHLORTHALIDONE 25 MG TABLE: 25 | 30 days supply | Qty: 30 | Fill #0

## 2017-09-13 MED FILL — !VENTOLIN HFA INHALER: 108 (90 BAS | 16 days supply | Qty: 18 | Fill #0

## 2017-09-13 MED FILL — LISINOPRIL 10 MG TABS: 10 | 30 days supply | Qty: 30 | Fill #2

## 2017-09-13 MED FILL — ?METOPROLOL TARTRATE 25 MG: 25 | 30 days supply | Qty: 60 | Fill #8

## 2017-09-13 MED FILL — ?ATORVASTATIN 10MG TABLET: 10 | 30 days supply | Qty: 30 | Fill #2

## 2017-10-18 ENCOUNTER — Other Ambulatory Visit: Payer: Self-pay | Admitting: Internal Medicine

## 2017-10-18 ENCOUNTER — Telehealth: Payer: Self-pay | Admitting: Internal Medicine

## 2017-10-18 DIAGNOSIS — M5410 Radiculopathy, site unspecified: Secondary | ICD-10-CM

## 2017-10-18 DIAGNOSIS — E119 Type 2 diabetes mellitus without complications: Secondary | ICD-10-CM

## 2017-10-18 DIAGNOSIS — I10 Essential (primary) hypertension: Secondary | ICD-10-CM

## 2017-10-18 MED FILL — GLIMEPIRIDE 2 MG TABS: 2 | 30 days supply | Qty: 30 | Fill #5

## 2017-10-18 MED FILL — ?METOPROLOL TARTRATE 25 MG: 25 | 30 days supply | Qty: 60 | Fill #9

## 2017-10-18 MED FILL — LISINOPRIL 10 MG TABS: 10 | 30 days supply | Qty: 30 | Fill #0

## 2017-10-18 MED FILL — ?ATORVASTATIN 10 MG TABLET: 10 | 30 days supply | Qty: 30 | Fill #0

## 2017-10-18 MED FILL — !VENTOLIN HFA INHALER: 108 (90 BAS | 16 days supply | Qty: 18 | Fill #1

## 2017-10-18 MED FILL — ?CHLORTHALIDONE 25 MG TABLE: 25 | 30 days supply | Qty: 30 | Fill #1

## 2017-10-18 NOTE — Telephone Encounter (Signed)
Will forward to pcp

## 2017-10-18 NOTE — Telephone Encounter (Signed)
Pt called stating her pharmacy needs  -glimepiride (AMARYL) 2 MG tablet Changed to one tablet twice a day, please follow up to [place]

## 2017-10-19 MED FILL — GABAPENTIN 300 MG CAPSULE: 300 | 30 days supply | Qty: 240 | Fill #0

## 2017-10-20 MED ORDER — GLIMEPIRIDE 2 MG PO TABS: 2.0000 mg | ORAL_TABLET | Freq: Every day | ORAL | 5 refills | Status: DC

## 2017-10-20 NOTE — Telephone Encounter (Signed)
Contacted pt to go over Dr. Wynetta Emery response. Pt states she was taking the metformin in the evening till it ran out and Dr. Wynetta Emery was going to up the glimepiride

## 2017-10-20 NOTE — Telephone Encounter (Signed)
Call and spoke with pt today.  Pt advised that on last visit we discussed dec Metformin from 1500 mg daily to 500 mg daily.  We added Amaryl 2 mg once a day.  Pt states that even with just 500 mg once a day, she was having diarrhea. Since she is still having diarrhea on low dose Metformin, pt told to stop the med.  We will increase Amaryl to 2 mg BID.

## 2017-11-22 ENCOUNTER — Ambulatory Visit: Payer: Self-pay | Attending: Internal Medicine | Admitting: Internal Medicine

## 2017-11-22 ENCOUNTER — Encounter: Payer: Self-pay | Admitting: Internal Medicine

## 2017-11-22 ENCOUNTER — Other Ambulatory Visit: Payer: Self-pay

## 2017-11-22 VITALS — BP 107/76 | HR 76 | Temp 97.9°F | Resp 16 | Wt 314.8 lb

## 2017-11-22 DIAGNOSIS — F32A Depression, unspecified: Secondary | ICD-10-CM

## 2017-11-22 DIAGNOSIS — E1142 Type 2 diabetes mellitus with diabetic polyneuropathy: Secondary | ICD-10-CM | POA: Insufficient documentation

## 2017-11-22 DIAGNOSIS — Z7982 Long term (current) use of aspirin: Secondary | ICD-10-CM | POA: Insufficient documentation

## 2017-11-22 DIAGNOSIS — M1612 Unilateral primary osteoarthritis, left hip: Secondary | ICD-10-CM | POA: Insufficient documentation

## 2017-11-22 DIAGNOSIS — I5030 Unspecified diastolic (congestive) heart failure: Secondary | ICD-10-CM | POA: Insufficient documentation

## 2017-11-22 DIAGNOSIS — E1165 Type 2 diabetes mellitus with hyperglycemia: Secondary | ICD-10-CM | POA: Insufficient documentation

## 2017-11-22 DIAGNOSIS — F329 Major depressive disorder, single episode, unspecified: Secondary | ICD-10-CM | POA: Insufficient documentation

## 2017-11-22 DIAGNOSIS — Z833 Family history of diabetes mellitus: Secondary | ICD-10-CM | POA: Insufficient documentation

## 2017-11-22 DIAGNOSIS — Z8249 Family history of ischemic heart disease and other diseases of the circulatory system: Secondary | ICD-10-CM | POA: Insufficient documentation

## 2017-11-22 DIAGNOSIS — Z760 Encounter for issue of repeat prescription: Secondary | ICD-10-CM | POA: Insufficient documentation

## 2017-11-22 DIAGNOSIS — E118 Type 2 diabetes mellitus with unspecified complications: Secondary | ICD-10-CM

## 2017-11-22 DIAGNOSIS — G4733 Obstructive sleep apnea (adult) (pediatric): Secondary | ICD-10-CM | POA: Insufficient documentation

## 2017-11-22 DIAGNOSIS — E559 Vitamin D deficiency, unspecified: Secondary | ICD-10-CM | POA: Insufficient documentation

## 2017-11-22 DIAGNOSIS — Z720 Tobacco use: Secondary | ICD-10-CM

## 2017-11-22 DIAGNOSIS — F17210 Nicotine dependence, cigarettes, uncomplicated: Secondary | ICD-10-CM | POA: Insufficient documentation

## 2017-11-22 DIAGNOSIS — M4726 Other spondylosis with radiculopathy, lumbar region: Secondary | ICD-10-CM | POA: Insufficient documentation

## 2017-11-22 DIAGNOSIS — E785 Hyperlipidemia, unspecified: Secondary | ICD-10-CM | POA: Insufficient documentation

## 2017-11-22 DIAGNOSIS — Z6841 Body Mass Index (BMI) 40.0 and over, adult: Secondary | ICD-10-CM | POA: Insufficient documentation

## 2017-11-22 DIAGNOSIS — E119 Type 2 diabetes mellitus without complications: Secondary | ICD-10-CM

## 2017-11-22 DIAGNOSIS — M5416 Radiculopathy, lumbar region: Secondary | ICD-10-CM | POA: Insufficient documentation

## 2017-11-22 DIAGNOSIS — IMO0002 Reserved for concepts with insufficient information to code with codable children: Secondary | ICD-10-CM

## 2017-11-22 DIAGNOSIS — I110 Hypertensive heart disease with heart failure: Secondary | ICD-10-CM | POA: Insufficient documentation

## 2017-11-22 DIAGNOSIS — I10 Essential (primary) hypertension: Secondary | ICD-10-CM

## 2017-11-22 DIAGNOSIS — Z791 Long term (current) use of non-steroidal anti-inflammatories (NSAID): Secondary | ICD-10-CM | POA: Insufficient documentation

## 2017-11-22 DIAGNOSIS — Z79899 Other long term (current) drug therapy: Secondary | ICD-10-CM | POA: Insufficient documentation

## 2017-11-22 DIAGNOSIS — M5410 Radiculopathy, site unspecified: Secondary | ICD-10-CM

## 2017-11-22 DIAGNOSIS — F419 Anxiety disorder, unspecified: Secondary | ICD-10-CM | POA: Insufficient documentation

## 2017-11-22 DIAGNOSIS — Z23 Encounter for immunization: Secondary | ICD-10-CM | POA: Insufficient documentation

## 2017-11-22 LAB — POCT URINALYSIS DIP (CLINITEK)
BILIRUBIN UA: NEGATIVE mg/dL
Bilirubin, UA: NEGATIVE
Leukocytes, UA: NEGATIVE
Nitrite, UA: NEGATIVE
POC,PROTEIN,UA: NEGATIVE
SPEC GRAV UA: 1.015 (ref 1.010–1.025)
Urobilinogen, UA: 0.2 E.U./dL
pH, UA: 6 (ref 5.0–8.0)

## 2017-11-22 LAB — POCT GLYCOSYLATED HEMOGLOBIN (HGB A1C): HbA1c, POC (controlled diabetic range): 8.7 % — AB (ref 0.0–7.0)

## 2017-11-22 LAB — GLUCOSE, POCT (MANUAL RESULT ENTRY): POC Glucose: 381 mg/dl — AB (ref 70–99)

## 2017-11-22 MED ORDER — GLIMEPIRIDE 2 MG PO TABS: 2.0000 mg | ORAL_TABLET | Freq: Every day | ORAL | 5 refills | Status: DC

## 2017-11-22 MED ORDER — ATORVASTATIN CALCIUM 10 MG PO TABS: 10.0000 mg | ORAL_TABLET | Freq: Every day | ORAL | 11 refills | Status: DC

## 2017-11-22 MED ORDER — METOPROLOL TARTRATE 25 MG PO TABS: 25.0000 mg | ORAL_TABLET | Freq: Two times a day (BID) | ORAL | 11 refills | Status: DC

## 2017-11-22 MED ORDER — SERTRALINE HCL 100 MG PO TABS: 150.0000 mg | ORAL_TABLET | Freq: Every day | ORAL | 6 refills | Status: DC

## 2017-11-22 MED ORDER — LISINOPRIL 10 MG PO TABS: 10.0000 mg | ORAL_TABLET | Freq: Every day | ORAL | 11 refills | Status: DC

## 2017-11-22 MED ORDER — BUPROPION HCL ER (SR) 150 MG PO TB12: 150.0000 mg | ORAL_TABLET | Freq: Two times a day (BID) | ORAL | 4 refills | Status: DC

## 2017-11-22 MED ORDER — GABAPENTIN 300 MG PO CAPS: ORAL_CAPSULE | ORAL | 11 refills | Status: DC

## 2017-11-22 MED ORDER — CHLORTHALIDONE 25 MG PO TABS: 25.0000 mg | ORAL_TABLET | Freq: Every morning | ORAL | 11 refills | Status: DC

## 2017-11-22 MED ORDER — GLIMEPIRIDE 2 MG PO TABS: 2.0000 mg | ORAL_TABLET | Freq: Two times a day (BID) | ORAL | 11 refills | Status: DC

## 2017-11-22 NOTE — Progress Notes (Signed)
Patient ID: Tracy Fitzgerald, female    DOB: Dec 22, 1965  MRN: [id number]  CC: Diabetes; Hypertension; and Medication Refill   Subjective: Tracy Fitzgerald is a 52 y.o. female who presents for chronic ds management. Her concerns today include:  52 year old with history of diabetes 2, tobacco dependence, radicular lower back pain (mod. LT foraminal stenosis and small central disc bulge at T12-L1 on MRI 05/2015), HTN, anxiety/depression, obesity, abnormal Pap, OSA, and hyperlipidemia  DM: On last visit we had decreased the dose of metformin due to diarrhea and added Amaryl 2 mg daily.  However metformin 500 mg daily still cause diarrhea so she discontinued taking it and increase Amaryl to twice a day. For the past 2 months she has ran out of Amaryl 2 weeks early because the pharmacy did not get an updated prescription for the increased dose.  Out of Amaryl x 1.5 wks. She was checking BS QID.  Range was 90-120, "and I was feeling better." Eating habits better. "I've cut those fats back.  Carbs are hard.  I get hungery and its easy to do instant mash potatoe or eat bread."  Eating more lean meat.  "I'm a lot more conscious of my choices." -Endorses numbness on the soles of the feet  HTN:  No device to check BP but is compliant with medications.  Tob dep:  Not ready to quit.  Wellbutrin helped decrease cravings which he has been out of it for about 3 months due to limited finances.  Dep/Anx: sometimes she is unable to afford her psychiatric meds.  Abilify helped her sleep.  She has not seen her psychiatrist at [facility] in 2 to 3 months due to limited finances.  She has also been out of her psych medications the same time.  Patient Active Problem List   Diagnosis Date Noted  . Special screening for malignant neoplasms, colon   . Benign neoplasm of rectum   . Hyperlipidemia, unspecified 10/10/2016  . Radicular low back pain 08/12/2016  . Anxiety and depression 08/12/2016  . Class 3 severe obesity  due to excess calories with serious comorbidity and body mass index (BMI) of 50.0 to 59.9 in adult ([place]) 08/12/2016  . New onset type 2 diabetes mellitus ([place]) 11/25/2015  . Severe dysplasia of cervix (CIN III) 06/25/2015  . Hemorrhoid 06/20/2015  . Perimenopausal 06/20/2015  . Osteoarthritis of spine with radiculopathy, lumbar region 05/29/2015  . Primary osteoarthritis of left hip 05/29/2015  . Vitamin D insufficiency 11/13/2014  . Diastolic CHF ([place]) 53/64/6803  . Numbness of foot 11/12/2014  . OSA (obstructive sleep apnea) 11/12/2014  . Tobacco abuse 06/17/2014  . Essential hypertension 06/17/2014     Current Outpatient Medications on File Prior to Visit  Medication Sig Dispense Refill  . albuterol (VENTOLIN HFA) 108 (90 Base) MCG/ACT inhaler INHALE 2 PUFFS INTO THE LUNGS EVERY 4 HOURS AS NEEDED FOR WHEEZING OR SHORTNESS OF BREATH. 54 g 0  . ARIPiprazole (ABILIFY) 5 MG tablet Take 5 mg by mouth daily.    . Aspirin-Salicylamide-Caffeine (BC HEADACHE POWDER PO) Take 1 packet by mouth 3 (three) times daily as needed (pain).    . Blood Glucose Monitoring Suppl (TRUE METRIX METER) w/Device KIT Use as directed 1 kit 0  . busPIRone (BUSPAR) 5 MG tablet Take 5 mg by mouth 2 (two) times daily.    . Cholecalciferol (VITAMIN D PO) Take 1 tablet by mouth daily.     Marland Kitchen glucose blood (TRUE METRIX BLOOD GLUCOSE TEST) test strip Use  as instructed 100 each 12  . Hyoscyamine Sulfate SL (LEVSIN/SL) 0.125 MG SUBL Place 0.125 mg under the tongue every 4 (four) hours as needed. (Patient taking differently: Place 0.125 mg under the tongue every 4 (four) hours as needed (stomach pain). ) 40 each 0  . ibuprofen (ADVIL,MOTRIN) 800 MG tablet Take 1 tablet (800 mg total) by mouth every 8 (eight) hours as needed for moderate pain. (Patient taking differently: Take 800 mg by mouth every 6 (six) hours as needed for moderate pain. ) 30 tablet 3  . TRUEPLUS LANCETS 28G MISC Use as directed 100 each 2   No current  facility-administered medications on file prior to visit.     No Known Allergies  Social History   Socioeconomic History  . Marital status: Married    Spouse name: Not on file  . Number of children: Not on file  . Years of education: Not on file  . Highest education level: Not on file  Occupational History  . Not on file  Social Needs  . Financial resource strain: Not on file  . Food insecurity:    Worry: Not on file    Inability: Not on file  . Transportation needs:    Medical: Not on file    Non-medical: Not on file  Tobacco Use  . Smoking status: Current Every Day Smoker    Packs/day: 1.00    Years: 33.00    Pack years: 33.00    Types: Cigarettes  . Smokeless tobacco: Current User  Substance and Sexual Activity  . Alcohol use: Yes    Comment: occ. monthly  . Drug use: No  . Sexual activity: Yes    Partners: Male    Birth control/protection: Post-menopausal  Lifestyle  . Physical activity:    Days per week: Not on file    Minutes per session: Not on file  . Stress: Not on file  Relationships  . Social connections:    Talks on phone: Not on file    Gets together: Not on file    Attends religious service: Not on file    Active member of club or organization: Not on file    Attends meetings of clubs or organizations: Not on file    Relationship status: Not on file  . Intimate partner violence:    Fear of current or ex partner: Not on file    Emotionally abused: Not on file    Physically abused: Not on file    Forced sexual activity: Not on file  Other Topics Concern  . Not on file  Social History Narrative  . Not on file    Family History  Problem Relation Age of Onset  . Hypertension Mother   . Diabetes Mother   . Uterine cancer Mother   . Heart disease Mother   . Cancer Mother        UTERINE CANCER   . Arthritis Mother   . Hypertension Father   . Kidney disease Father   . Breast cancer Neg Hx   . Colon cancer Neg Hx     Past Surgical  History:  Procedure Laterality Date  . ADENOIDECTOMY    . COLONOSCOPY WITH PROPOFOL N/A 05/24/2017   Procedure: COLONOSCOPY WITH PROPOFOL;  Surgeon: Jerene Bears, MD;  Location: [facility] ENDOSCOPY;  Service: Gastroenterology;  Laterality: N/A;  . TONSILLECTOMY      ROS: Review of Systems Negative except as above. PHYSICAL EXAM: BP 107/76   Pulse 76   Temp  97.9 F (36.6 C) (Oral)   Resp 16   Wt (!) 314 lb 12.8 oz (142.8 kg)   LMP 11/23/2015   SpO2 97%   BMI 50.81 kg/m   Wt Readings from Last 3 Encounters:  11/22/17 (!) 314 lb 12.8 oz (142.8 kg)  05/24/17 (!) 319 lb (144.7 kg)  04/22/17 (!) 319 lb 12.8 oz (145.1 kg)    Physical Exam  General appearance - alert, well appearing, morbidly obese middle-age Caucasian female and in no distress Mental status - normal mood, behavior, speech, dress, motor activity, and thought processes.  Patient tearful when talking about her limited finances. Neck - supple, no significant adenopathy Chest - clear to auscultation, no wheezes, rales or rhonchi, symmetric air entry Heart - normal rate, regular rhythm, normal S1, S2, no murmurs, rubs, clicks or gallops Extremities - peripheral pulses normal, no pedal edema, no clubbing or cyanosis Diabetic Foot Exam - Simple   Simple Foot Form Visual Inspection See comments:  Yes Sensation Testing See comments:  Yes Pulse Check Comments Ingrown toenail of the right big toe.  Decreased sensation with leap exam on the soles of both feet.  No ulcers or calluses.      Results for orders placed or performed in visit on 11/22/17  POCT glucose (manual entry)  Result Value Ref Range   POC Glucose 381 (A) 70 - 99 mg/dl  POCT glycosylated hemoglobin (Hb A1C)  Result Value Ref Range   Hemoglobin A1C     HbA1c POC (<> result, manual entry)     HbA1c, POC (prediabetic range)     HbA1c, POC (controlled diabetic range) 8.7 (A) 0.0 - 7.0 %  POCT URINALYSIS DIP (CLINITEK)  Result Value Ref Range   Color, UA  yellow yellow   Clarity, UA clear clear   Glucose, UA =500 (A) negative mg/dL   Bilirubin, UA negative negative   Ketones, POC UA negative negative mg/dL   Spec Grav, UA 1.015 1.010 - 1.025   Blood, UA moderate (A) negative   pH, UA 6.0 5.0 - 8.0   POC Protein UA Negative Negative, Trace   Urobilinogen, UA 0.2 0.2 or 1.0 E.U./dL   Nitrite, UA Negative Negative   Leukocytes, UA Negative Negative    ASSESSMENT AND PLAN: 1. Diabetes mellitus type 2, uncontrolled, with complications ([place]) Not at goal due to running out of Amaryl.  New prescription sent reflecting increased dosing to twice daily. Commended her on trying to make changes in her eating habits. - Microalbumin / creatinine urine ratio - POCT glucose (manual entry) - POCT glycosylated hemoglobin (Hb A1C) - POCT URINALYSIS DIP (CLINITEK) - CBC - Comprehensive metabolic panel - Lipid panel - atorvastatin (LIPITOR) 10 MG tablet; Take 1 tablet (10 mg total) by mouth daily.  Dispense: 30 tablet; Refill: 11 - glimepiride (AMARYL) 2 MG tablet; Take 1 tablet (2 mg total) by mouth 2 (two) times daily.  Dispense: 60 tablet; Refill: 11  2. Diabetic polyneuropathy associated with type 2 diabetes mellitus ([place]) Encourage patient to wear proper shoes at all times given decreased sensation in her feet. She is on gabapentin for her back but could also help for her feet.  3. Essential hypertension At goal. - chlorthalidone (HYGROTON) 25 MG tablet; Take 1 tablet (25 mg total) by mouth every morning.  Dispense: 30 tablet; Refill: 11 - lisinopril (PRINIVIL,ZESTRIL) 10 MG tablet; Take 1 tablet (10 mg total) by mouth daily.  Dispense: 30 tablet; Refill: 11 - metoprolol tartrate (LOPRESSOR) 25 MG  tablet; Take 1 tablet (25 mg total) by mouth 2 (two) times daily.  Dispense: 60 tablet; Refill: 11  4. Anxiety and depression Patient plans to get back in at [facility]. - sertraline (ZOLOFT) 100 MG tablet; Take 1.5 tablets (150 mg total) by mouth daily.   Dispense: 45 tablet; Refill: 6  5. Tobacco abuse Patient advised to quit smoking. Discussed health risks associated with smoking including lung and other types of cancers, chronic lung diseases and CV risks.. Pt not ready to give trail of quitting.  However I will refill the Wellbutrin which she is on for mental health reasons but she also find it helpful in decreasing her cravings for cigarettes. - buPROPion (WELLBUTRIN SR) 150 MG 12 hr tablet; Take 1 tablet (150 mg total) by mouth 2 (two) times daily.  Dispense: 60 tablet; Refill: 4  6. Need for influenza vaccination  7. Radicular low back pain - gabapentin (NEURONTIN) 300 MG capsule; TAKE 2 CAPSULES BY MOUTH 4 TIMES DAILY.  Dispense: 240 capsule; Refill: 11  Patient was given the opportunity to ask questions.  Patient verbalized understanding of the plan and was able to repeat key elements of the plan.   Orders Placed This Encounter  Procedures  . Microalbumin / creatinine urine ratio  . CBC  . Comprehensive metabolic panel  . Lipid panel  . POCT glucose (manual entry)  . POCT glycosylated hemoglobin (Hb A1C)  . POCT URINALYSIS DIP (CLINITEK)     Requested Prescriptions   Signed Prescriptions Disp Refills  . atorvastatin (LIPITOR) 10 MG tablet 30 tablet 11    Sig: Take 1 tablet (10 mg total) by mouth daily.  Marland Kitchen buPROPion (WELLBUTRIN SR) 150 MG 12 hr tablet 60 tablet 4    Sig: Take 1 tablet (150 mg total) by mouth 2 (two) times daily.  . chlorthalidone (HYGROTON) 25 MG tablet 30 tablet 11    Sig: Take 1 tablet (25 mg total) by mouth every morning.  . gabapentin (NEURONTIN) 300 MG capsule 240 capsule 11    Sig: TAKE 2 CAPSULES BY MOUTH 4 TIMES DAILY.  Marland Kitchen lisinopril (PRINIVIL,ZESTRIL) 10 MG tablet 30 tablet 11    Sig: Take 1 tablet (10 mg total) by mouth daily.  . sertraline (ZOLOFT) 100 MG tablet 45 tablet 6    Sig: Take 1.5 tablets (150 mg total) by mouth daily.  . metoprolol tartrate (LOPRESSOR) 25 MG tablet 60 tablet 11      Sig: Take 1 tablet (25 mg total) by mouth 2 (two) times daily.  Marland Kitchen glimepiride (AMARYL) 2 MG tablet 60 tablet 11    Sig: Take 1 tablet (2 mg total) by mouth 2 (two) times daily.    Return in about 3 months (around 02/21/2018).  Karle Plumber, MD, FACP

## 2017-11-22 NOTE — Patient Instructions (Signed)

## 2017-11-23 LAB — MICROALBUMIN / CREATININE URINE RATIO
Creatinine, Urine: 69.1 mg/dL
MICROALB/CREAT RATIO: 7.8 mg/g{creat} (ref 0.0–30.0)
MICROALBUM., U, RANDOM: 5.4 ug/mL

## 2017-11-24 ENCOUNTER — Other Ambulatory Visit: Payer: Self-pay

## 2017-12-30 MED FILL — !VENTOLIN HFA INHALER: 108 (90 BAS | 16 days supply | Qty: 18 | Fill #2

## 2017-12-30 MED FILL — GABAPENTIN 300 MG CAPSULE: 300 | 30 days supply | Qty: 240 | Fill #1

## 2018-02-14 ENCOUNTER — Other Ambulatory Visit: Payer: Self-pay | Admitting: Internal Medicine

## 2018-02-14 DIAGNOSIS — R0602 Shortness of breath: Secondary | ICD-10-CM

## 2018-02-14 MED FILL — ATORVASTATIN 10 MG TABLET: 10 | 30 days supply | Qty: 30 | Fill #0

## 2018-02-14 MED FILL — GABAPENTIN 300 MG CAPSULE: 300 | 30 days supply | Qty: 240 | Fill #0

## 2018-02-15 MED FILL — ALBUTEROL SULFATE HFA 108 (: 108 (90 BAS | 16 days supply | Qty: 18 | Fill #0

## 2018-02-21 ENCOUNTER — Ambulatory Visit: Payer: Self-pay | Admitting: Internal Medicine

## 2018-03-20 MED FILL — GABAPENTIN 300 MG CAPSULE: 300 | 30 days supply | Qty: 240 | Fill #1

## 2018-05-12 MED FILL — GABAPENTIN 300 MG CAPSULE: 300 | 30 days supply | Qty: 240 | Fill #2

## 2018-06-08 ENCOUNTER — Other Ambulatory Visit: Payer: Self-pay | Admitting: Family Medicine

## 2018-06-08 ENCOUNTER — Telehealth: Payer: Self-pay | Admitting: Family Medicine

## 2018-06-08 ENCOUNTER — Encounter: Payer: Self-pay | Admitting: Internal Medicine

## 2018-06-08 DIAGNOSIS — E1165 Type 2 diabetes mellitus with hyperglycemia: Secondary | ICD-10-CM

## 2018-06-08 DIAGNOSIS — E118 Type 2 diabetes mellitus with unspecified complications: Principal | ICD-10-CM

## 2018-06-08 DIAGNOSIS — IMO0002 Reserved for concepts with insufficient information to code with codable children: Secondary | ICD-10-CM

## 2018-06-08 MED ORDER — GLIMEPIRIDE 4 MG PO TABS: 8.0000 mg | ORAL_TABLET | Freq: Every day | ORAL | 1 refills | Status: DC

## 2018-06-08 MED FILL — GABAPENTIN 300 MG CAPSULE: 300 | 30 days supply | Qty: 240 | Fill #3

## 2018-06-08 NOTE — Telephone Encounter (Signed)
Contacted pt and schedule a telephone visit with Dr. Wynetta Emery on 06/12/18 @330pm 

## 2018-06-08 NOTE — Telephone Encounter (Signed)
Can you please place this patient on Dr. Durenda Age schedule for next week?  Thank you

## 2018-06-12 ENCOUNTER — Ambulatory Visit: Payer: Self-pay | Attending: Internal Medicine | Admitting: Internal Medicine

## 2018-06-12 ENCOUNTER — Other Ambulatory Visit: Payer: Self-pay

## 2018-06-12 DIAGNOSIS — E1142 Type 2 diabetes mellitus with diabetic polyneuropathy: Secondary | ICD-10-CM

## 2018-06-12 DIAGNOSIS — Z5900 Homelessness unspecified: Secondary | ICD-10-CM

## 2018-06-12 DIAGNOSIS — E118 Type 2 diabetes mellitus with unspecified complications: Principal | ICD-10-CM

## 2018-06-12 DIAGNOSIS — E1165 Type 2 diabetes mellitus with hyperglycemia: Secondary | ICD-10-CM

## 2018-06-12 DIAGNOSIS — G4733 Obstructive sleep apnea (adult) (pediatric): Secondary | ICD-10-CM

## 2018-06-12 DIAGNOSIS — Z9989 Dependence on other enabling machines and devices: Secondary | ICD-10-CM

## 2018-06-12 DIAGNOSIS — F419 Anxiety disorder, unspecified: Secondary | ICD-10-CM

## 2018-06-12 DIAGNOSIS — I10 Essential (primary) hypertension: Secondary | ICD-10-CM

## 2018-06-12 DIAGNOSIS — IMO0002 Reserved for concepts with insufficient information to code with codable children: Secondary | ICD-10-CM

## 2018-06-12 DIAGNOSIS — F329 Major depressive disorder, single episode, unspecified: Secondary | ICD-10-CM

## 2018-06-12 MED ORDER — GABAPENTIN 300 MG PO CAPS: ORAL_CAPSULE | ORAL | 4 refills | Status: DC

## 2018-06-12 MED ORDER — DULOXETINE HCL 30 MG PO CPEP: 30.0000 mg | ORAL_CAPSULE | Freq: Every day | ORAL | 3 refills | Status: DC

## 2018-06-12 NOTE — Progress Notes (Signed)
Pt is requesting a letter to be faxed to her lawyer about being disable because of her anxiety and depression  Pt states she is already taking gabapentin for diabetic neuropathy and she is wanting to know if there is anything else she can take or can be done   Pt states she has pain coming from her feet which is a 6-7 on a pain scale  Pt states when she is laying down her feet pain is a 10

## 2018-06-12 NOTE — Progress Notes (Signed)
Virtual Visit via Telephone Note  I connected with Tracy Fitzgerald on 06/12/18 at 4:29 p.m by telephone from my office and verified that I am speaking with the correct person using two identifiers. Pt is living out of her car.     I discussed the limitations, risks, security and privacy concerns of performing an evaluation and management service by telephone and the availability of in person appointments. I also discussed with the patient that there may be a patient responsible charge related to this service. The patient expressed understanding and agreed to proceed.   History of Present Illness: 53 year old with history of diabetes 2, tobacco dependence, radicular lower back pain (mod. LT foraminal stenosis and small central disc bulge at T12-L1 on MRI 05/2015), HTN, anxiety/depression, obesity, abnormal Pap, OSA, and hyperlipidemia   DM: BS was running high for several wks up to 265.  She called last week and spoke with the on-call physician who recommended increasing Amaryl to 8 mg daily.  She has been taking 4 mg twice a day to make up 8 mg daily.  Blood sugars since then have been 90-99, checking 4 x a day -"I over eat, I know I do."  She has been eating which she can.  She has been going to food banks.  She and her spouse are intermittently homeless.  They stay at a motel when he gets paid but currently they are living out of their car until he gets paid later this week.  -Complains of neuropathy in her feet getting.  Most bothersome at nights.  She describes it as being stung by bees and she has a lot of numbness in the feet.  She is on gabapentin 600 mg 4 times a day -She has a TENS unit which she uses on her lower back for chronic back pain.  She takes ibuprofen 800 mg 4 times a day.  "It is my lifeline."  Depression: She has been out of Zoloft, Abilify and Wellbutrin for about 9 months due to limited finances.  She has not seen [facility] in a while.  Appt with [facility] gradual for April 16th.    No active plan, I will turn myself I n first.  Scored high on the PHQ 9 today with a 1 for suicidal thoughts.  When asked about this, patient states that she has no active plans of hurting herself but she does feel down at times.  She states that if it ever gets to that she will check herself into the hospital.  She is wanting a letter for her attorney stating that she is disabled based on depression and anxiety  OSA:  Using nightly  HTN: no device to check blood pressure but reports compliance with lisinopril, chlorthalidone and metoprolol  Observations/Objective:  Depression screen [facility] 2/9 06/12/2018 04/22/2017 02/28/2017  Decreased Interest 3 3 3   Down, Depressed, Hopeless 3 3 3   PHQ - 2 Score 6 6 6   Altered sleeping 3 2 3   Tired, decreased energy 3 3 3   Change in appetite 3 3 3   Feeling bad or failure about yourself  3 2 3   Trouble concentrating 3 3 3   Moving slowly or fidgety/restless 0 0 0  Suicidal thoughts 1 0 0  PHQ-9 Score 22 19 21   Some recent data might be hidden    Assessment and Plan: 1. Diabetes mellitus type 2, uncontrolled, with complications ([place]) Blood sugars reportedly better with increased dose of Amaryl. Patient is semi-homeless and is having to eat which she  can -It is been a while since she has had blood test done.  She will come as a future visit to have blood tests including A1c, chemistry, lipid and CBC  2. Diabetic polyneuropathy associated with type 2 diabetes mellitus (HCC) Continue gabapentin but we discussed increasing the nighttime dose from 600 mg to 900 mg.  Patient is in agreement with this and new prescription will be sent to pharmacy. Also discussed changing Zoloft which she has not taken for about 9 months to Cymbalta which will help with neuropathy symptoms and also with depression and anxiety. -I have asked her to cut back on using ibuprofen on a daily basis as it can adversely affect her kidneys  3. Essential hypertension Continue current  medications and low-salt diet  4. Anxiety and depression Patient to keep appointment with [facility] as scheduled for next month.  They have done a one-month refill on Wellbutrin, Zoloft, and Abilify until she is seen.  She has not picked up these medicines as yet.  We decided to have her not take the Zoloft as it will be changed to Cymbalta instead.  She denies any active suicidal plan at this time.  I have encouraged her to call or come in should this happen and she is agreeable to doing that. -We will have our clinical social worker follow-up with her  5. Homelessness We will have clinical social worker follow-up with her for needs assessment.  6. OSA on CPAP Continue using CPAP machine   Follow Up Instructions: F/u in 2 mths   I discussed the assessment and treatment plan with the patient. The patient was provided an opportunity to ask questions and all were answered. The patient agreed with the plan and demonstrated an understanding of the instructions.   The patient was advised to call back or seek an in-person evaluation if the symptoms worsen or if the condition fails to improve as anticipated.  I provided  21 minutes of non-face-to-face time during this encounter.   Karle Plumber, MD

## 2018-06-16 ENCOUNTER — Telehealth: Payer: Self-pay | Admitting: Licensed Clinical Social Worker

## 2018-06-16 NOTE — Telephone Encounter (Signed)
LCSWA attempted to contact pt to follow up on behavioral health screens from prior appointment. LCSWA left message for a return call.  

## 2018-06-21 ENCOUNTER — Ambulatory Visit: Payer: Self-pay | Attending: Family Medicine | Admitting: Licensed Clinical Social Worker

## 2018-06-21 DIAGNOSIS — F419 Anxiety disorder, unspecified: Secondary | ICD-10-CM

## 2018-06-21 DIAGNOSIS — Z5900 Homelessness unspecified: Secondary | ICD-10-CM

## 2018-06-21 DIAGNOSIS — F331 Major depressive disorder, recurrent, moderate: Secondary | ICD-10-CM

## 2018-06-22 ENCOUNTER — Other Ambulatory Visit: Payer: Self-pay

## 2018-06-26 NOTE — BH Specialist Note (Signed)
Integrated Behavioral Health Visit via Telemedicine (Telephone)  06/21/2018 Tracy Fitzgerald [id number]   Session Start time: 10:40 AM  Session End time: 11:00 AM Total time: 20 minutes  Referring Provider: Dr. Wynetta Emery Type of Visit: Telephonic Patient location: [facility] Provider location: Office All persons participating in visit: Pt  Confirmed patient's address: Yes  Confirmed patient's phone number: Yes  Any changes to demographics: No   Confirmed patient's insurance: Yes  Any changes to patient's insurance: No   Discussed confidentiality: Yes    The following statements were read to the patient and/or legal guardian that are established with the [facility] Provider.  "The purpose of this phone visit is to provide behavioral health care while limiting exposure to the coronavirus (COVID19).  There is a possibility of technology failure and discussed alternative modes of communication if that failure occurs."  "By engaging in this telephone visit, you consent to the provision of healthcare.  Additionally, you authorize for your insurance to be billed for the services provided during this telephone visit."   Patient and/or legal guardian consented to telephone visit: Yes   PRESENTING CONCERNS: Patient and/or family reports the following symptoms/concerns: Pt reports difficulty managing depression and anxiety triggered by psychosocial stressors. She and spouse are currently homeless and residing in their car due to financial strain.  Duration of problem: 1 month; Severity of problem: moderate  STRENGTHS (Protective Factors/Coping Skills): Pt has good insight  Pt is connected with community agencies Pt is participating in medication management  GOALS ADDRESSED: Patient will: 1.  Reduce symptoms of: anxiety and depression  2.  Increase knowledge and/or ability of: coping skills and healthy habits  3.  Demonstrate ability to: Increase healthy adjustment to current  life circumstances and Improve medication compliance  INTERVENTIONS: Interventions utilized:  Solution-Focused Strategies, Supportive Counseling and Link to [employer] Standardized Assessments completed: Not Needed  ASSESSMENT: Patient currently experiencing depression and anxiety triggered by chronic pain, financial strain, and homelessness. She and spouse are currently residing in their car due to pt's inability to maintain employment. She reports receiving strong support from spouse. Denies suicidal/homicidal ideations.     Patient has an upcoming appointment with Psychiatrist at [facility] on 06/27/2018. She is currently taking Cymbalta as prescribed; however, has been unable to afford Wellbutrin and Abilify. LCSWA discussed correlation between one's physical and mental health, in addition, to how stress negatively impacts health. Healthy coping skills were discussed and supportive resources provided. Pt is currently working with Lysbeth Galas at [employer] to assist with finding family housing resources. She has a Chief Executive Officer to assist with re-application for disability.  PLAN: 1. Follow up with behavioral health clinician on : Pt was encouraged to contact LCSWA if symptoms worsen or fail to improve to schedule behavioral appointments at [facility]. 2. Behavioral recommendations: LCSWA recommends that pt apply healthy coping skills discussed, comply with medication management, and follow through with scheduled appointment at [facility]. Referral(s): [place] (In Clinic)  Rebekah Chesterfield, [place] 06/26/2018 10:01 AM

## 2018-08-14 ENCOUNTER — Other Ambulatory Visit: Payer: Self-pay | Admitting: Family Medicine

## 2018-08-14 DIAGNOSIS — E1165 Type 2 diabetes mellitus with hyperglycemia: Secondary | ICD-10-CM

## 2018-08-14 DIAGNOSIS — IMO0002 Reserved for concepts with insufficient information to code with codable children: Secondary | ICD-10-CM

## 2018-09-11 ENCOUNTER — Other Ambulatory Visit: Payer: Self-pay | Admitting: Internal Medicine

## 2018-09-11 DIAGNOSIS — E1165 Type 2 diabetes mellitus with hyperglycemia: Secondary | ICD-10-CM

## 2018-09-11 DIAGNOSIS — IMO0002 Reserved for concepts with insufficient information to code with codable children: Secondary | ICD-10-CM

## 2018-10-07 ENCOUNTER — Other Ambulatory Visit: Payer: Self-pay | Admitting: Internal Medicine

## 2018-10-07 DIAGNOSIS — E1165 Type 2 diabetes mellitus with hyperglycemia: Secondary | ICD-10-CM

## 2018-10-07 DIAGNOSIS — IMO0002 Reserved for concepts with insufficient information to code with codable children: Secondary | ICD-10-CM

## 2018-10-13 ENCOUNTER — Ambulatory Visit: Payer: Self-pay | Attending: Internal Medicine | Admitting: Internal Medicine

## 2018-10-13 ENCOUNTER — Ambulatory Visit: Payer: Self-pay | Attending: Internal Medicine | Admitting: Licensed Clinical Social Worker

## 2018-10-13 ENCOUNTER — Encounter: Payer: Self-pay | Admitting: Internal Medicine

## 2018-10-13 ENCOUNTER — Other Ambulatory Visit: Payer: Self-pay

## 2018-10-13 VITALS — BP 95/60 | HR 83 | Temp 98.5°F | Resp 16 | Wt 308.0 lb

## 2018-10-13 DIAGNOSIS — Z5900 Homelessness unspecified: Secondary | ICD-10-CM | POA: Insufficient documentation

## 2018-10-13 DIAGNOSIS — M545 Low back pain: Secondary | ICD-10-CM

## 2018-10-13 DIAGNOSIS — E1142 Type 2 diabetes mellitus with diabetic polyneuropathy: Secondary | ICD-10-CM

## 2018-10-13 DIAGNOSIS — Z5941 Food insecurity: Secondary | ICD-10-CM

## 2018-10-13 DIAGNOSIS — E860 Dehydration: Secondary | ICD-10-CM

## 2018-10-13 DIAGNOSIS — I10 Essential (primary) hypertension: Secondary | ICD-10-CM

## 2018-10-13 DIAGNOSIS — G8929 Other chronic pain: Secondary | ICD-10-CM

## 2018-10-13 DIAGNOSIS — E118 Type 2 diabetes mellitus with unspecified complications: Secondary | ICD-10-CM

## 2018-10-13 DIAGNOSIS — Z720 Tobacco use: Secondary | ICD-10-CM

## 2018-10-13 DIAGNOSIS — F419 Anxiety disorder, unspecified: Secondary | ICD-10-CM

## 2018-10-13 DIAGNOSIS — F332 Major depressive disorder, recurrent severe without psychotic features: Secondary | ICD-10-CM

## 2018-10-13 DIAGNOSIS — R197 Diarrhea, unspecified: Secondary | ICD-10-CM | POA: Insufficient documentation

## 2018-10-13 DIAGNOSIS — Z594 Lack of adequate food and safe drinking water: Secondary | ICD-10-CM

## 2018-10-13 DIAGNOSIS — F329 Major depressive disorder, single episode, unspecified: Secondary | ICD-10-CM

## 2018-10-13 DIAGNOSIS — E1165 Type 2 diabetes mellitus with hyperglycemia: Secondary | ICD-10-CM

## 2018-10-13 DIAGNOSIS — Z6841 Body Mass Index (BMI) 40.0 and over, adult: Secondary | ICD-10-CM

## 2018-10-13 LAB — GLUCOSE, POCT (MANUAL RESULT ENTRY): POC Glucose: 208 mg/dl — AB (ref 70–99)

## 2018-10-13 LAB — POCT GLYCOSYLATED HEMOGLOBIN (HGB A1C): HbA1c, POC (controlled diabetic range): 6.7 % (ref 0.0–7.0)

## 2018-10-13 MED ORDER — LISINOPRIL 10 MG PO TABS: 10.0000 mg | ORAL_TABLET | Freq: Every day | ORAL | 11 refills | Status: DC

## 2018-10-13 MED ORDER — METOPROLOL TARTRATE 25 MG PO TABS: 25.0000 mg | ORAL_TABLET | Freq: Two times a day (BID) | ORAL | 11 refills | Status: DC

## 2018-10-13 MED ORDER — GABAPENTIN 300 MG PO CAPS: ORAL_CAPSULE | ORAL | 4 refills | Status: DC

## 2018-10-13 MED ORDER — GLIMEPIRIDE 4 MG PO TABS: 4.0000 mg | ORAL_TABLET | Freq: Every day | ORAL | 3 refills | Status: DC

## 2018-10-13 MED ORDER — TRULICITY 0.75 MG/0.5ML ~~LOC~~ SOAJ: 0.7500 mg | SUBCUTANEOUS | 6 refills | Status: DC

## 2018-10-13 MED ORDER — ALBUTEROL SULFATE HFA 108 (90 BASE) MCG/ACT IN AERS: INHALATION_SPRAY | RESPIRATORY_TRACT | 12 refills | Status: DC

## 2018-10-13 MED ORDER — ATORVASTATIN CALCIUM 10 MG PO TABS: 10.0000 mg | ORAL_TABLET | Freq: Every day | ORAL | 11 refills | Status: DC

## 2018-10-13 MED FILL — TRULICITY 0.75 MG/0.5 ML PE: 0.75 | 28 days supply | Qty: 2 | Fill #0

## 2018-10-13 NOTE — BH Specialist Note (Signed)
Integrated Behavioral Health Initial Visit  MRN: [id number] Name: Tracy Fitzgerald  Number of [place] Clinician visits:: 1/6 Session Start time: 11:40 AM  Session End time: 12:15 PM Total time: 30 minutes  Type of Service: [place] Interpretor:No. Interpretor Name and Language: NA   Warm Hand Off Completed.       SUBJECTIVE: Tracy Fitzgerald is a 53 y.o. female accompanied by self Patient was referred by Dr. Wynetta Emery for depression, anxiety, and community resources. Patient reports the following symptoms/concerns: Pt reports difficulty managing mental health triggered by psychosocial stressors. She reports food insecurity making it difficult to manage blood sugars Duration of problem: Ongoing; Severity of problem: severe  OBJECTIVE: Mood: Anxious and Depressed and Affect: Appropriate Risk of harm to self or others: No plan to harm self or others Pt scored positive on phq9; however, denies intent to harm self or others. She is aware of crisis intervention resources and is connected with a community provider, [employer]  LIFE CONTEXT: Family and Social: Pt resides with spouse in a local motel School/Work: Pt is interested in obtaining [place] and [employer]; however, cant obtain IRS information Self-Care: Pt receives behavioral health services through [place]. Her next scheduled appointment is on 08/12 Life Changes: Pt experiencing psychosocial stressors that are negatively impacting patient's mental and physical health  STRENGTHS (Protective Factors/Coping Skills): Pt has good insight  Pt is participating in medication management and psychotherapy  GOALS ADDRESSED: Patient will: 1. Reduce symptoms of: anxiety, depression and stress 2. Increase knowledge and/or ability of: self-management skills  3. Demonstrate ability to: Increase adequate support systems for patient/family  INTERVENTIONS: Interventions utilized: Solution-Focused  Strategies and Link to [employer]  Standardized Assessments completed: GAD-7 and PHQ 2&9 with C-SSRS  ASSESSMENT: Patient currently experiencing anxiety and depression. Pt experiencing psychosocial stressors that are negatively impacting patient's mental and physical health. Pt scored positive on phq9; however, denies intent to harm self or others. She is aware of crisis intervention resources and is connected with a community provider, [employer]. She has an upcoming appointment with therapist on 10/25/2018   Patient may benefit from linkage to community resources. LCSW provided patient with local resources to assist with food insecurity to assist with management of blood sugars. Additional strategies to assist patient with coping with stress were discussed.   PLAN: 1. Follow up with behavioral health clinician on : Pt encouraged to contact LCSW with any additional resource needs 2. Behavioral recommendations: LCSW recommend pt to follow through with services through [facility] and utilize resources provided 3. Referral(s): Community Resources:  Food 4. "From scale of 1-10, how likely are you to follow plan?": 10  Rebekah Chesterfield, LCSW 10/19/2018 9:22 AM

## 2018-10-13 NOTE — Progress Notes (Signed)
Patient ID: Tracy Fitzgerald, female    DOB: [birth year]  MRN: [id number]  CC: Diarrhea, Diabetes, and Hypertension   Subjective: Tracy Fitzgerald is a 53 y.o. female who presents for chronic ds management. Her concerns today include:  Hx DM 2 with neuropathy, tob dep, radicular lower back pain (mod. LT foraminal stenosis and small central disc bulge at T12-L1 on MRI 05/2015), HTN, anxiety/depression, obesity, abnormal Pap, OSA, and hyperlipidemia  Homeless:  Still homeless.  No longer in car but staying at [employer]) is working with her to find more stable housing.  Anxiety/Depression:  Still followed at [facility].  Has a new therapist Had to stop Cymbalta after 1.5 mths because it was causing urinary retention.  The psychiatrist placed her back on Zoloft.   LBP/hip: Cymbalta did help but she had to stop the medication for the reasons stated above. Taking Aleve 1 tab TID instead of Ibuprofen 872m QID.  I had wanted her on last visit about chronic daily use of NSAIDs. She has not had any falls.  She ambulates with a cane.  DIABETES TYPE 2 Last A1C:   Results for orders placed or performed in visit on 10/13/18  POCT glucose (manual entry)  Result Value Ref Range   POC Glucose 208 (A) 70 - 99 mg/dl  POCT glycosylated hemoglobin (Hb A1C)  Result Value Ref Range   Hemoglobin A1C     HbA1c POC (<> result, manual entry)     HbA1c, POC (prediabetic range)     HbA1c, POC (controlled diabetic range) 6.7 0.0 - 7.0 %    Med Adherence:  '[x]'  Yes    '[]'  No Medication side effects:  '[]'  Yes    '[x]'  No Home Monitoring?  '[x]'  Yes  Every morning and sometimes during the day  Home glucose results range: 140-170.  This morning was 148 Diet Adherence: '[x]'  Yes  - doing better. Eating more fresh veggies and less carb.  BS go up when she eats white carbs Exercise: '[]'  Yes    '[x]'  No Hypoglycemic episodes?: '[]'  Yes    '[x]'  No Numbness of the feet? '[x]'  Yes - better with increase dose of  night time dose of Gabapentin Retinopathy hx? no Last eye exam: over due.  She wears prescription glasses.  Will try to get eye exam at [facility]  Comments:   Diarrhea:  Came on 1 mth ago.  Going 15-20 times a day.  No initiating factors.  She and her husband intermittently lives out of the car.  At one point they were using a cup for several days without washing it and a lot of debris had accumulated around the mouth of the cup.  She wonders whether she may have gotten a parasitic infection from that.  Her husband is not having any diarrhea.   [place].  The effects of it last for about 5 hours.  She tried Imodium but did not find it helpful.   No change in meds no recent antibiotics No blood in stools She drinks fluids to try to keep herself hydrated  Tob dep:  Not ready to quit. 1 pk/day  HTN, blood pressure noted to be low today.  She reports compliance with medications including chlorthalidone, metoprolol and lisinopril.  She is taken medicines already today.   Patient Active Problem List   Diagnosis Date Noted   Homelessness 10/13/2018   Diarrhea 10/13/2018   Diabetic polyneuropathy associated with type 2 diabetes mellitus (  [place]) 11/22/2017   Special screening for malignant neoplasms, colon    Benign neoplasm of rectum    Hyperlipidemia, unspecified 10/10/2016   Radicular low back pain 08/12/2016   Anxiety and depression 08/12/2016   Class 3 severe obesity due to excess calories with serious comorbidity and body mass index (BMI) of 50.0 to 59.9 in adult [facility]) 08/12/2016   New onset type 2 diabetes mellitus ([place]) 11/25/2015   Severe dysplasia of cervix (CIN III) 06/25/2015   Hemorrhoid 06/20/2015   Perimenopausal 06/20/2015   Osteoarthritis of spine with radiculopathy, lumbar region 05/29/2015   Primary osteoarthritis of left hip 05/29/2015   Vitamin D insufficiency 38/25/0539   Diastolic CHF ([place]) 76/73/4193   Numbness of foot  11/12/2014   OSA (obstructive sleep apnea) 11/12/2014   Tobacco abuse 06/17/2014   Essential hypertension 06/17/2014     Current Outpatient Medications on File Prior to Visit  Medication Sig Dispense Refill   sertraline (ZOLOFT) 100 MG tablet Take 150 mg by mouth daily.     ARIPiprazole (ABILIFY) 5 MG tablet Take 5 mg by mouth daily.     Aspirin-Salicylamide-Caffeine (BC HEADACHE POWDER PO) Take 1 packet by mouth 3 (three) times daily as needed (pain).     Blood Glucose Monitoring Suppl (TRUE METRIX METER) w/Device KIT Use as directed 1 kit 0   buPROPion (WELLBUTRIN SR) 150 MG 12 hr tablet Take 1 tablet (150 mg total) by mouth 2 (two) times daily. 60 tablet 4   busPIRone (BUSPAR) 5 MG tablet Take 5 mg by mouth 2 (two) times daily.     Cholecalciferol (VITAMIN D PO) Take 1 tablet by mouth daily.      glucose blood (TRUE METRIX BLOOD GLUCOSE TEST) test strip Use as instructed 100 each 12   Hyoscyamine Sulfate SL (LEVSIN/SL) 0.125 MG SUBL Place 0.125 mg under the tongue every 4 (four) hours as needed. (Patient taking differently: Place 0.125 mg under the tongue every 4 (four) hours as needed (stomach pain). ) 40 each 0   TRUEPLUS LANCETS 28G MISC Use as directed 100 each 2   No current facility-administered medications on file prior to visit.     Allergies  Allergen Reactions   Cymbalta [Duloxetine Hcl]     Urinary retention    Social History   Socioeconomic History   Marital status: Married    Spouse name: Not on file   Number of children: Not on file   Years of education: Not on file   Highest education level: Not on file  Occupational History   Not on file  Social Needs   Financial resource strain: Not on file   Food insecurity    Worry: Not on file    Inability: Not on file   Transportation needs    Medical: Not on file    Non-medical: Not on file  Tobacco Use   Smoking status: Current Every Day Smoker    Packs/day: 1.00    Years: 33.00     Pack years: 33.00    Types: Cigarettes   Smokeless tobacco: Current User  Substance and Sexual Activity   Alcohol use: Yes    Comment: occ. monthly   Drug use: No   Sexual activity: Yes    Partners: Male    Birth control/protection: Post-menopausal  Lifestyle   Physical activity    Days per week: Not on file    Minutes per session: Not on file   Stress: Not on file  Relationships   Social connections  Talks on phone: Not on file    Gets together: Not on file    Attends religious service: Not on file    Active member of club or organization: Not on file    Attends meetings of clubs or organizations: Not on file    Relationship status: Not on file   Intimate partner violence    Fear of current or ex partner: Not on file    Emotionally abused: Not on file    Physically abused: Not on file    Forced sexual activity: Not on file  Other Topics Concern   Not on file  Social History Narrative   Not on file    Family History  Problem Relation Age of Onset   Hypertension Mother    Diabetes Mother    Uterine cancer Mother    Heart disease Mother    Cancer Mother        UTERINE CANCER    Arthritis Mother    Hypertension Father    Kidney disease Father    Breast cancer Neg Hx    Colon cancer Neg Hx     Past Surgical History:  Procedure Laterality Date   ADENOIDECTOMY     COLONOSCOPY WITH PROPOFOL N/A 05/24/2017   Procedure: COLONOSCOPY WITH PROPOFOL;  Surgeon: Jerene Bears, MD;  Location: [facility];  Service: Gastroenterology;  Laterality: N/A;   TONSILLECTOMY      ROS: Review of Systems Negative except as stated above  PHYSICAL EXAM: BP 95/60    Pulse 83    Temp 98.5 F (36.9 C) (Oral)    Resp 16    Wt (!) 308 lb (139.7 kg)    LMP 11/23/2015    SpO2 96%    BMI 49.71 kg/m   Wt Readings from Last 3 Encounters:  10/13/18 (!) 308 lb (139.7 kg)  11/22/17 (!) 314 lb 12.8 oz (142.8 kg)  05/24/17 (!) 319 lb (144.7 kg)    Physical  Exam  General appearance - alert, well appearing, morbidly obese middle-aged Caucasian female and in no distress Mental status - normal mood, behavior, speech, dress, motor activity, and thought processes Mouth -oral mucosa appears dry Neck - supple, no significant adenopathy Chest - clear to auscultation, no wheezes, rales or rhonchi, symmetric air entry Heart - normal rate, regular rhythm, normal S1, S2, no murmurs, rubs, clicks or gallops Musculoskeletal -she transfers from chair to exam table independently.  Ambulates with a cane.  Gait is stable but slow Extremities -trace lower extremity edema  Results for orders placed or performed in visit on 10/13/18  POCT glucose (manual entry)  Result Value Ref Range   POC Glucose 208 (A) 70 - 99 mg/dl  POCT glycosylated hemoglobin (Hb A1C)  Result Value Ref Range   Hemoglobin A1C     HbA1c POC (<> result, manual entry)     HbA1c, POC (prediabetic range)     HbA1c, POC (controlled diabetic range) 6.7 0.0 - 7.0 %    CMP Latest Ref Rng & Units 01/11/2017 08/12/2016 06/25/2016  Glucose 65 - 99 mg/dL 134(H) 119(H) 239(H)  BUN 6 - 24 mg/dL '19 20 13  ' Creatinine 0.57 - 1.00 mg/dL 1.05(H) 1.14(H) 1.29(H)  Sodium 134 - 144 mmol/L 140 137 138  Potassium 3.5 - 5.2 mmol/L 4.4 4.4 4.4  Chloride 96 - 106 mmol/L 98 99 102  CO2 20 - 29 mmol/L '23 21 26  ' Calcium 8.7 - 10.2 mg/dL 10.6(H) 10.2 10.0  Total Protein 6.0 - 8.5  g/dL - 6.7 6.7  Total Bilirubin 0.0 - 1.2 mg/dL - 0.4 0.5  Alkaline Phos 39 - 117 IU/L - 98 88  AST 0 - 40 IU/L - 24 27  ALT 0 - 32 IU/L - 44(H) 32   Lipid Panel     Component Value Date/Time   CHOL 201 (H) 08/12/2016 1214   TRIG 264 (H) 08/12/2016 1214   HDL 49 08/12/2016 1214   CHOLHDL 4.1 08/12/2016 1214   CHOLHDL 3.3 06/19/2014 0321   VLDL 30 06/19/2014 0321   LDLCALC 99 08/12/2016 1214    CBC    Component Value Date/Time   WBC 10.5 06/25/2016 2237   RBC 4.43 06/25/2016 2237   HGB 14.6 06/25/2016 2237   HCT 43.8  06/25/2016 2237   PLT 267 06/25/2016 2237   MCV 98.9 06/25/2016 2237   MCH 33.0 06/25/2016 2237   MCHC 33.3 06/25/2016 2237   RDW 13.0 06/25/2016 2237   LYMPHSABS 1.8 07/10/2007 1525   MONOABS 0.9 07/10/2007 1525   EOSABS 0.4 07/10/2007 1525   BASOSABS 0.1 07/10/2007 1525   Depression screen PHQ 2/9 10/13/2018 06/12/2018 04/22/2017  Decreased Interest '3 3 3  ' Down, Depressed, Hopeless '3 3 3  ' PHQ - 2 Score '6 6 6  ' Altered sleeping '3 3 2  ' Tired, decreased energy '3 3 3  ' Change in appetite '3 3 3  ' Feeling bad or failure about yourself  '3 3 2  ' Trouble concentrating '3 3 3  ' Moving slowly or fidgety/restless 0 0 0  Suicidal thoughts 1 1 0  PHQ-9 Score '22 22 19  ' Some recent data might be hidden   GAD 7 : Generalized Anxiety Score 10/13/2018 02/28/2017 01/11/2017 10/12/2016  Nervous, Anxious, on Edge '3 3 3 3  ' Control/stop worrying 0 0 0 0  Worry too much - different things 0 0 0 0  Trouble relaxing '3 3 3 3  ' Restless '2 3 3 3  ' Easily annoyed or irritable 3 0 0 0  Afraid - awful might happen 2 1 0 0  Total GAD 7 Score '13 10 9 9  ' Anxiety Difficulty - - - -      ASSESSMENT AND PLAN: 1. Type 2 diabetes, controlled, with peripheral neuropathy (HCC) A1c is at goal.  However patient is reporting morning blood sugars that are above goal. -I commended her on changes that she has made in her eating habits.  She would like to move more but is limited due to chronic back pain issues.  We discussed putting her on a medication like Trulicity to maintain diabetic control and also help with weight loss.  Patient is agreeable to trying this medication. -I recommend cutting back on glimepiride from 4 mg BID to 4 mg daily.  Monitor the blood sugars closely.  If blood sugars start to drop we will need to stop the glimepiride completely and just continue the Trulicity - POCT glucose (manual entry) - POCT glycosylated hemoglobin (Hb A1C) - atorvastatin (LIPITOR) 10 MG tablet; Take 1 tablet (10 mg total) by mouth  daily.  Dispense: 30 tablet; Refill: 11 - glimepiride (AMARYL) 4 MG tablet; Take 1 tablet (4 mg total) by mouth daily with breakfast.  Dispense: 60 tablet; Refill: 3 - CBC - Comprehensive metabolic panel - Lipid panel - Dulaglutide (TRULICITY) 0.25 [place]/7.0WC SOPN; Inject 0.75 mg into the skin once a week.  Dispense: 4 pen; Refill: 6  2. Diabetic polyneuropathy associated with type 2 diabetes mellitus (HCC) - gabapentin (NEURONTIN) 300 MG capsule;  TAKE 2 CAPSULES BY MOUTH 3 TIMES DAILY and 3 caps at bedtime.  Dispense: 270 capsule; Refill: 4  3. Essential hypertension Blood pressure today is low and patient appears a little dehydrated.  Diarrhea may be playing a role.  Advised patient to stop the chlorthalidone for now.  We will check chemistry - metoprolol tartrate (LOPRESSOR) 25 MG tablet; Take 1 tablet (25 mg total) by mouth 2 (two) times daily.  Dispense: 60 tablet; Refill: 11 - lisinopril (ZESTRIL) 10 MG tablet; Take 1 tablet (10 mg total) by mouth daily.  Dispense: 30 tablet; Refill: 11  4. Dehydration Encourage fluid intake especially while she is having this diarrhea  5. Tobacco abuse Advised to quit.  Discussed health risks associated with smoking.  Patient not willing to give a trial of quitting at this time.  Less than 5 minutes spent on counseling  6. Anxiety and depression Patient with high score on PHQ 9.  LCSW to see her today.  She is plugged in with mental health services through [facility].  Advised to be seen in the emergency room if any suicidal thoughts  7. Diarrhea, unspecified type Questionable etiology.  We will check her for C. difficile and other stool culture.  Avoid high fructose content foods.  Avoid foods with a lot of artificial sweeteners - Ambulatory referral to Gastroenterology - Cdiff NAA+O+P+Stool Culture  8. Chronic midline low back pain, unspecified whether sciatica present Continue gabapentin.  I am concerned about her being on over-the-counter NSAIDs  especially in light of apparent dehydration from ongoing diarrhea.  Advised to stop.  If creatinine looks okay we will try her with meloxicam or Celebrex  9. Homelessness Patient was given the opportunity to ask questions.  Patient verbalized understanding of the plan and was able to repeat key elements of the plan.   10.  Morbid obesity See #1 above  Patient is overdue for Pap smear.  She had history of abnormal Paps in the past.  Colposcopy done December 2018 was negative.  She was to have a repeat Pap in 1 year.  We will try to do the Pap smear on next visit  Orders Placed This Encounter  Procedures   Cdiff NAA+O+P+Stool Culture   CBC   Comprehensive metabolic panel   Lipid panel   Ambulatory referral to Gastroenterology   POCT glucose (manual entry)   POCT glycosylated hemoglobin (Hb A1C)     Requested Prescriptions   Signed Prescriptions Disp Refills   albuterol (VENTOLIN HFA) 108 (90 Base) MCG/ACT inhaler 54 g 12    Sig: INHALE 2 PUFFS INTO THE LUNGS EVERY 4 HOURS AS NEEDED FOR WHEEZING OR SHORTNESS OF BREATH.   atorvastatin (LIPITOR) 10 MG tablet 30 tablet 11    Sig: Take 1 tablet (10 mg total) by mouth daily.   gabapentin (NEURONTIN) 300 MG capsule 270 capsule 4    Sig: TAKE 2 CAPSULES BY MOUTH 3 TIMES DAILY and 3 caps at bedtime.   glimepiride (AMARYL) 4 MG tablet 60 tablet 3    Sig: Take 1 tablet (4 mg total) by mouth daily with breakfast.   metoprolol tartrate (LOPRESSOR) 25 MG tablet 60 tablet 11    Sig: Take 1 tablet (25 mg total) by mouth 2 (two) times daily.   lisinopril (ZESTRIL) 10 MG tablet 30 tablet 11    Sig: Take 1 tablet (10 mg total) by mouth daily.   Dulaglutide (TRULICITY) 2.90 SX/1.1BZ SOPN 4 pen 6    Sig: Inject 0.75 mg into  the skin once a week.    Return in about 1 month (around 11/13/2018).  Karle Plumber, MD, FACP

## 2018-10-13 NOTE — Patient Instructions (Signed)
Stop chlorthalidone for now due to low blood pressure and persistent diarrhea. Drink adequate fluids during the day to keep yourself hydrated.  Decrease glimepiride to 4 mg daily.  We have added a new diabetes medication called Trulicity which you will inject once a week.  Continue to monitor your blood sugars.  Let me know if blood sugars start running too high or too low.  I have referred you to the gastroenterologist for the diarrhea that you are experiencing.  Please complete forms for the orange card and the cone discount so that it will cover the cost of seeing the specialist.

## 2018-10-13 NOTE — Progress Notes (Signed)
Pt states her pain is radiating down to her left hip and leg

## 2018-10-14 LAB — COMPREHENSIVE METABOLIC PANEL
ALT: 22 IU/L (ref 0–32)
AST: 15 IU/L (ref 0–40)
Albumin/Globulin Ratio: 1.7 (ref 1.2–2.2)
Albumin: 4.3 g/dL (ref 3.8–4.9)
Alkaline Phosphatase: 102 IU/L (ref 39–117)
BUN/Creatinine Ratio: 17 (ref 9–23)
BUN: 18 mg/dL (ref 6–24)
Bilirubin Total: <0.2 mg/dL (ref 0.0–1.2)
CO2: 20 mmol/L (ref 20–29)
Calcium: 9.7 mg/dL (ref 8.7–10.2)
Chloride: 104 mmol/L (ref 96–106)
Creatinine, Ser: 1.05 mg/dL — ABNORMAL HIGH (ref 0.57–1.00)
GFR calc Af Amer: 70 mL/min/{1.73_m2} (ref >59)
GFR calc non Af Amer: 61 mL/min/{1.73_m2} (ref >59)
Globulin, Total: 2.6 g/dL (ref 1.5–4.5)
Glucose: 119 mg/dL — ABNORMAL HIGH (ref 65–99)
Potassium: 4.5 mmol/L (ref 3.5–5.2)
Sodium: 137 mmol/L (ref 134–144)
Total Protein: 6.9 g/dL (ref 6.0–8.5)

## 2018-10-14 LAB — CBC
Hematocrit: 47.1 % — ABNORMAL HIGH (ref 34.0–46.6)
Hemoglobin: 16 g/dL — ABNORMAL HIGH (ref 11.1–15.9)
MCH: 31.7 pg (ref 26.6–33.0)
MCHC: 34 g/dL (ref 31.5–35.7)
MCV: 93 fL (ref 79–97)
Platelets: 303 10*3/uL (ref 150–450)
RBC: 5.05 x10E6/uL (ref 3.77–5.28)
RDW: 13 % (ref 11.7–15.4)
WBC: 13.4 10*3/uL — ABNORMAL HIGH (ref 3.4–10.8)

## 2018-10-14 LAB — LIPID PANEL
Chol/HDL Ratio: 3.8 ratio (ref 0.0–4.4)
Cholesterol, Total: 189 mg/dL (ref 100–199)
HDL: 50 mg/dL (ref >39)
LDL Calculated: 105 mg/dL — ABNORMAL HIGH (ref 0–99)
Triglycerides: 170 mg/dL — ABNORMAL HIGH (ref 0–149)
VLDL Cholesterol Cal: 34 mg/dL (ref 5–40)

## 2018-10-15 ENCOUNTER — Other Ambulatory Visit: Payer: Self-pay | Admitting: Internal Medicine

## 2018-10-15 DIAGNOSIS — E1142 Type 2 diabetes mellitus with diabetic polyneuropathy: Secondary | ICD-10-CM

## 2018-10-15 MED ORDER — ATORVASTATIN CALCIUM 20 MG PO TABS: 20.0000 mg | ORAL_TABLET | Freq: Every day | ORAL | 6 refills | Status: DC

## 2018-10-15 MED ORDER — MELOXICAM 15 MG PO TABS: 15.0000 mg | ORAL_TABLET | Freq: Every day | ORAL | 3 refills | Status: DC

## 2018-10-19 LAB — CDIFF NAA+O+P+STOOL CULTURE
E coli, Shiga toxin Assay: NEGATIVE
Toxigenic C. Difficile by PCR: NEGATIVE

## 2018-10-20 ENCOUNTER — Telehealth: Payer: Self-pay

## 2018-10-20 NOTE — Telephone Encounter (Signed)
Contacted pt to go over lab results pt didn't answer lvm asking pt to give me a call at her earliest convenience  

## 2018-10-25 ENCOUNTER — Telehealth: Payer: Self-pay | Admitting: *Deleted

## 2018-10-25 ENCOUNTER — Telehealth: Payer: Self-pay | Admitting: Internal Medicine

## 2018-10-25 MED ORDER — SULFAMETHOXAZOLE-TRIMETHOPRIM 400-80 MG PO TABS: 1.0000 | ORAL_TABLET | Freq: Two times a day (BID) | ORAL | 0 refills | Status: DC

## 2018-10-25 MED FILL — GABAPENTIN 300 MG CAPSULE: 300 | 30 days supply | Qty: 270 | Fill #0

## 2018-10-25 MED FILL — TRULICITY 0.75 MG/0.5 ML PE: 0.75 | 28 days supply | Qty: 2 | Fill #0

## 2018-10-25 NOTE — Telephone Encounter (Signed)
Informed patient with her lab results and she verbalized understanding and agrees with advise.   Per pt she have a boil on her scalp and would like antibiotics sent to the pharmacy. Per pt this is why her white cells are up.

## 2018-10-25 NOTE — Addendum Note (Signed)
Addended by: Karle Plumber B on: 10/25/2018 10:24 AM   Modules accepted: Orders

## 2018-10-25 NOTE — Telephone Encounter (Signed)
Spoke with patient and informed her with what provider stated and patient verbalized understanding.  

## 2018-11-15 ENCOUNTER — Ambulatory Visit: Payer: Self-pay

## 2018-11-16 ENCOUNTER — Ambulatory Visit: Payer: Self-pay

## 2018-11-16 ENCOUNTER — Encounter: Payer: Self-pay | Admitting: Internal Medicine

## 2018-11-16 ENCOUNTER — Other Ambulatory Visit: Payer: Self-pay | Admitting: Internal Medicine

## 2018-11-16 ENCOUNTER — Ambulatory Visit: Payer: Self-pay | Attending: Internal Medicine | Admitting: Internal Medicine

## 2018-11-16 ENCOUNTER — Telehealth: Payer: Self-pay

## 2018-11-16 DIAGNOSIS — E1142 Type 2 diabetes mellitus with diabetic polyneuropathy: Secondary | ICD-10-CM

## 2018-11-16 DIAGNOSIS — R197 Diarrhea, unspecified: Secondary | ICD-10-CM

## 2018-11-16 MED ORDER — GLIMEPIRIDE 4 MG PO TABS: 4.0000 mg | ORAL_TABLET | Freq: Two times a day (BID) | ORAL | 3 refills | Status: DC

## 2018-11-16 NOTE — Telephone Encounter (Signed)
Patient states that her pulse is 80 and her respirations are at 18 and her blood sugars has been running in the 200's. Patient would like Glimipride added to her medications.

## 2018-11-16 NOTE — Progress Notes (Signed)
Virtual Visit via Telephone Note Due to current restrictions/limitations of in-office visits due to the COVID-19 pandemic, this scheduled clinical appointment was converted to a telehealth visit  Called at 4:13 p.m unable to reach I connected with Tracy Fitzgerald on 11/16/18 at 4:39 p/m by telephone and verified that I am speaking with the correct person using two identifiers. I am in my office.  The patient is at home.  Only the patient and myself participated in this encounter.  I discussed the limitations, risks, security and privacy concerns of performing an evaluation and management service by telephone and the availability of in person appointments. I also discussed with the patient that there may be a patient responsible charge related to this service. The patient expressed understanding and agreed to proceed.  History of Present Illness: Hx DM 2 with neuropathy, tob dep, radicular lower back pain (mod. LT foraminal stenosis and small central disc bulge at T12-L1 on MRI 05/2015), HTN, anxiety/depression, obesity, abnormal Pap, OSA, intermittent homelessness and hyperlipidemia.  Patient was last seen about 5 weeks ago.   DM: Trulicity started on last visit.  She was advised to cut back on Amaryl from twice a day to once a day dosing.  Trulicity alone does not control BS.  As a matter of fact she found that she had to continue taking the Amaryl twice a day to keep her blood sugars good.  Ran out of Amaryl 2 wks ago and BS went from 80-120 to 159-196 since being out of the Amaryl.  BS 224 today.  So far she is tolerating the Trulicity. -needs stripes and Lancets  Diarrhea: still occurring.  Called by [facility] but she has not called back but plans to do so.  States she has a lot going on.  Still dealing with intermittent homelessness.  She plans to get her Pap and mammogram through [facility] Encounter Medications as of 11/16/2018  Medication Sig  . albuterol (VENTOLIN HFA) 108 (90 Base)  MCG/ACT inhaler INHALE 2 PUFFS INTO THE LUNGS EVERY 4 HOURS AS NEEDED FOR WHEEZING OR SHORTNESS OF BREATH.  Marland Kitchen ARIPiprazole (ABILIFY) 5 MG tablet Take 5 mg by mouth daily.  . Aspirin-Salicylamide-Caffeine (BC HEADACHE POWDER PO) Take 1 packet by mouth 3 (three) times daily as needed (pain).  Marland Kitchen atorvastatin (LIPITOR) 20 MG tablet Take 1 tablet (20 mg total) by mouth daily.  . Blood Glucose Monitoring Suppl (TRUE METRIX METER) w/Device KIT Use as directed  . buPROPion (WELLBUTRIN SR) 150 MG 12 hr tablet Take 1 tablet (150 mg total) by mouth 2 (two) times daily.  . busPIRone (BUSPAR) 5 MG tablet Take 5 mg by mouth 2 (two) times daily.  . Cholecalciferol (VITAMIN D PO) Take 1 tablet by mouth daily.   . Dulaglutide (TRULICITY) 0.88 PJ/0.3PR SOPN Inject 0.75 mg into the skin once a week.  . gabapentin (NEURONTIN) 300 MG capsule TAKE 2 CAPSULES BY MOUTH 3 TIMES DAILY and 3 caps at bedtime.  Marland Kitchen glimepiride (AMARYL) 4 MG tablet Take 1 tablet (4 mg total) by mouth daily with breakfast.  . glucose blood (TRUE METRIX BLOOD GLUCOSE TEST) test strip Use as instructed  . Hyoscyamine Sulfate SL (LEVSIN/SL) 0.125 MG SUBL Place 0.125 mg under the tongue every 4 (four) hours as needed. (Patient taking differently: Place 0.125 mg under the tongue every 4 (four) hours as needed (stomach pain). )  . lisinopril (ZESTRIL) 10 MG tablet Take 1 tablet (10 mg total) by mouth daily.  . meloxicam (MOBIC) 15  MG tablet Take 1 tablet (15 mg total) by mouth daily.  . metoprolol tartrate (LOPRESSOR) 25 MG tablet Take 1 tablet (25 mg total) by mouth 2 (two) times daily.  . sertraline (ZOLOFT) 100 MG tablet Take 150 mg by mouth daily.  Marland Kitchen sulfamethoxazole-trimethoprim (BACTRIM) 400-80 MG tablet Take 1 tablet by mouth 2 (two) times daily. (Patient not taking: Reported on 11/16/2018)  . TRUEPLUS LANCETS 28G MISC Use as directed   No facility-administered encounter medications on file as of 11/16/2018.     Observations/Objective: Results  for orders placed or performed in visit on 10/13/18  Cdiff NAA+O+P+Stool Culture   Specimen: Blood   ST  Result Value Ref Range   Salmonella/Shigella Screen Final report    Stool Culture result 1 (RSASHR) Comment    Campylobacter Culture Final report    Stool Culture result 1 (CMPCXR) Comment    E coli, Shiga toxin Assay Negative Negative   OVA + PARASITE EXAM Final report    O&P result 1 Comment    Toxigenic C. Difficile by PCR Negative Negative  CBC  Result Value Ref Range   WBC 13.4 (H) 3.4 - 10.8 x10E3/uL   RBC 5.05 3.77 - 5.28 x10E6/uL   Hemoglobin 16.0 (H) 11.1 - 15.9 g/dL   Hematocrit 47.1 (H) 34.0 - 46.6 %   MCV 93 79 - 97 fL   MCH 31.7 26.6 - 33.0 pg   MCHC 34.0 31.5 - 35.7 g/dL   RDW 13.0 11.7 - 15.4 %   Platelets 303 150 - 450 x10E3/uL  Comprehensive metabolic panel  Result Value Ref Range   Glucose 119 (H) 65 - 99 mg/dL   BUN 18 6 - 24 mg/dL   Creatinine, Ser 1.05 (H) 0.57 - 1.00 mg/dL   GFR calc non Af Amer 61 >59 mL/min/1.73   GFR calc Af Amer 70 >59 mL/min/1.73   BUN/Creatinine Ratio 17 9 - 23   Sodium 137 134 - 144 mmol/L   Potassium 4.5 3.5 - 5.2 mmol/L   Chloride 104 96 - 106 mmol/L   CO2 20 20 - 29 mmol/L   Calcium 9.7 8.7 - 10.2 mg/dL   Total Protein 6.9 6.0 - 8.5 g/dL   Albumin 4.3 3.8 - 4.9 g/dL   Globulin, Total 2.6 1.5 - 4.5 g/dL   Albumin/Globulin Ratio 1.7 1.2 - 2.2   Bilirubin Total <0.2 0.0 - 1.2 mg/dL   Alkaline Phosphatase 102 39 - 117 IU/L   AST 15 0 - 40 IU/L   ALT 22 0 - 32 IU/L  Lipid panel  Result Value Ref Range   Cholesterol, Total 189 100 - 199 mg/dL   Triglycerides 170 (H) 0 - 149 mg/dL   HDL 50 >39 mg/dL   VLDL Cholesterol Cal 34 5 - 40 mg/dL   LDL Calculated 105 (H) 0 - 99 mg/dL   Chol/HDL Ratio 3.8 0.0 - 4.4 ratio  POCT glucose (manual entry)  Result Value Ref Range   POC Glucose 208 (A) 70 - 99 mg/dl  POCT glycosylated hemoglobin (Hb A1C)  Result Value Ref Range   Hemoglobin A1C     HbA1c POC (<> result, manual  entry)     HbA1c, POC (prediabetic range)     HbA1c, POC (controlled diabetic range) 6.7 0.0 - 7.0 %     Assessment and Plan: 1. Type 2 diabetes, controlled, with peripheral neuropathy ([place]) Patient to continue Trulicity.  I have refill the Amaryl.  Advised to monitor blood sugars closely.  If blood sugars start falling below 80 she needs to cut back on the Amaryl.  Encourage healthy eating habits as much as she is able to.  2. Diarrhea, unspecified type Encouraged her to call of [facility] back to schedule the appointment    Follow Up Instructions: 3 mths   I discussed the assessment and treatment plan with the patient. The patient was provided an opportunity to ask questions and all were answered. The patient agreed with the plan and demonstrated an understanding of the instructions.   The patient was advised to call back or seek an in-person evaluation if the symptoms worsen or if the condition fails to improve as anticipated.  I provided 10 minutes of non-face-to-face time during this encounter.   Karle Plumber, MD

## 2018-11-16 NOTE — Telephone Encounter (Signed)
Will forward to pcp

## 2018-11-17 MED FILL — GLIMEPIRIDE 4 MG TABS: 4 | 30 days supply | Qty: 60 | Fill #0

## 2018-11-21 ENCOUNTER — Other Ambulatory Visit: Payer: Self-pay | Admitting: Internal Medicine

## 2018-11-21 DIAGNOSIS — E119 Type 2 diabetes mellitus without complications: Secondary | ICD-10-CM

## 2018-11-21 MED FILL — CHLORTHALIDONE 25 MG TAB: 25 | 90 days supply | Qty: 90 | Fill #0

## 2018-11-21 MED FILL — SERTRALINE HCL 100 MG TAB: 100 | 30 days supply | Qty: 45 | Fill #0

## 2018-11-21 MED FILL — ATORVASTATIN 10 MG TABLET: 10 | 30 days supply | Qty: 30 | Fill #1

## 2018-11-21 MED FILL — GABAPENTIN 300 MG CAPSULE: 300 | 30 days supply | Qty: 270 | Fill #1

## 2018-11-21 MED FILL — TRULICITY 0.75 MG/0.5 ML PE: 0.75 | 28 days supply | Qty: 2 | Fill #1

## 2018-11-21 MED FILL — BUPROPION SR 150 MG TABLET: 150 | 30 days supply | Qty: 60 | Fill #0

## 2018-11-21 MED FILL — ARIPiprazole 5 MG TABS: 5 | 30 days supply | Qty: 30 | Fill #0

## 2018-11-22 MED FILL — TRUEplus LANCETS 28G MISC: 30 days supply | Qty: 100 | Fill #0

## 2018-12-15 MED FILL — GABAPENTIN 300 MG CAPSULE: 300 | 30 days supply | Qty: 270 | Fill #1

## 2018-12-15 MED FILL — ?ARIPRAZOLE 5 MG TABS: 5 | 30 days supply | Qty: 30 | Fill #0

## 2018-12-15 MED FILL — SERTRALINE HCL 100 MG TAB: 100 | 30 days supply | Qty: 45 | Fill #0

## 2018-12-15 MED FILL — TRULICITY 0.75 MG/0.5 ML PE: 0.75 | 28 days supply | Qty: 2 | Fill #1

## 2018-12-15 MED FILL — BUPROPION SR 150 MG TABLET: 150 | 30 days supply | Qty: 60 | Fill #0

## 2018-12-15 MED FILL — GLIMEPIRIDE 4 MG TABS: 4 | 30 days supply | Qty: 60 | Fill #0

## 2019-01-15 MED FILL — BUPROPION SR 150 MG TABLET: 150 | 30 days supply | Qty: 60 | Fill #1

## 2019-01-15 MED FILL — TRULICITY 0.75 MG/0.5 ML PE: 0.75 | 28 days supply | Qty: 2 | Fill #2

## 2019-01-15 MED FILL — LISINOPRIL 10 MG TABS: 10 | 30 days supply | Qty: 30 | Fill #0

## 2019-01-15 MED FILL — SERTRALINE HCL 100 MG TAB: 100 | 30 days supply | Qty: 45 | Fill #1

## 2019-01-15 MED FILL — GLIMEPIRIDE 4 MG TABS: 4 | 30 days supply | Qty: 60 | Fill #1

## 2019-01-15 MED FILL — TRUEplus LANCETS 28G MISC: 30 days supply | Qty: 100 | Fill #0

## 2019-01-15 MED FILL — GABAPENTIN 300 MG CAPSULE: 300 | 30 days supply | Qty: 270 | Fill #2

## 2019-01-15 MED FILL — ?ARIPRAZOLE 5 MG TABS: 5 | 30 days supply | Qty: 30 | Fill #1

## 2019-02-14 MED FILL — MELOXICAM 15 MG TABLET: 15 | 30 days supply | Qty: 30 | Fill #0

## 2019-02-14 MED FILL — !TRULICITY 0.75 MG/0.5 ML P: 0.75 | 28 days supply | Qty: 2 | Fill #3

## 2019-02-14 MED FILL — GLIMEPIRIDE 4 MG TABS: 4 | 30 days supply | Qty: 60 | Fill #2

## 2019-02-14 MED FILL — LISINOPRIL 10 MG TABS: 10 | 30 days supply | Qty: 30 | Fill #1

## 2019-02-14 MED FILL — GABAPENTIN 300 MG CAPSULE: 300 | 30 days supply | Qty: 270 | Fill #3

## 2019-02-14 MED FILL — ?METOPROLOL 25 MG TABLET: 25 | 30 days supply | Qty: 60 | Fill #0

## 2019-02-19 ENCOUNTER — Other Ambulatory Visit: Payer: Self-pay | Admitting: Internal Medicine

## 2019-03-02 ENCOUNTER — Other Ambulatory Visit: Payer: Self-pay

## 2019-03-02 ENCOUNTER — Ambulatory Visit: Payer: Self-pay | Attending: Internal Medicine | Admitting: Internal Medicine

## 2019-03-19 MED FILL — LISINOPRIL 10 MG TABS: 10 | 30 days supply | Qty: 30 | Fill #2

## 2019-03-19 MED FILL — GLIMEPIRIDE 4 MG TABS: 4 | 30 days supply | Qty: 60 | Fill #3

## 2019-03-19 MED FILL — MELOXICAM 15 MG TABLET: 15 | 30 days supply | Qty: 30 | Fill #1

## 2019-03-19 MED FILL — GABAPENTIN 300 MG CAPSULE: 300 | 30 days supply | Qty: 270 | Fill #4

## 2019-03-19 MED FILL — ?METOPROLOL 25 MG TABLET: 25 | 30 days supply | Qty: 60 | Fill #1

## 2019-03-20 MED FILL — TRULICITY 0.75 MG/0.5 ML PE: 0.75 | 28 days supply | Qty: 2 | Fill #4

## 2019-03-20 MED FILL — ALBUTEROL SULFATE HFA 108 (: 108 (90 BAS | 16 days supply | Qty: 18 | Fill #0

## 2019-03-23 ENCOUNTER — Other Ambulatory Visit: Payer: Self-pay | Admitting: Internal Medicine

## 2019-03-23 DIAGNOSIS — E1165 Type 2 diabetes mellitus with hyperglycemia: Secondary | ICD-10-CM

## 2019-03-23 DIAGNOSIS — IMO0002 Reserved for concepts with insufficient information to code with codable children: Secondary | ICD-10-CM

## 2019-03-28 MED FILL — MELOXICAM 15 MG TABLET: 15 | 30 days supply | Qty: 30 | Fill #1

## 2019-03-28 MED FILL — LISINOPRIL 10 MG TABS: 10 | 30 days supply | Qty: 30 | Fill #2

## 2019-03-28 MED FILL — GLIMEPIRIDE 4 MG TABS: 4 | 30 days supply | Qty: 60 | Fill #3

## 2019-03-28 MED FILL — ALBUTEROL SULFATE HFA 108 (: 108 (90 BAS | 16 days supply | Qty: 18 | Fill #0

## 2019-03-28 MED FILL — TRULICITY 0.75 MG/0.5 ML PE: 0.75 | 28 days supply | Qty: 2 | Fill #4

## 2019-03-28 MED FILL — GABAPENTIN 300 MG CAPSULE: 300 | 30 days supply | Qty: 270 | Fill #4

## 2019-03-28 MED FILL — ?METOPROLOL 25 MG TABLET: 25 | 30 days supply | Qty: 60 | Fill #1

## 2019-03-29 MED FILL — ?ARIPRAZOLE 5 MG TABS: 5 | 30 days supply | Qty: 30 | Fill #0

## 2019-03-29 MED FILL — TRUEplus LANCETS 28G MISC: 30 days supply | Qty: 100 | Fill #1

## 2019-03-29 MED FILL — BUPROPION SR 150 MG TABLET: 150 | 30 days supply | Qty: 60 | Fill #0

## 2019-03-29 MED FILL — SERTRALINE HCL 100 MG TAB: 100 | 45 days supply | Qty: 45 | Fill #0

## 2019-04-12 ENCOUNTER — Ambulatory Visit: Payer: Self-pay | Attending: Internal Medicine | Admitting: Internal Medicine

## 2019-04-12 ENCOUNTER — Other Ambulatory Visit: Payer: Self-pay

## 2019-05-07 MED FILL — SERTRALINE HCL 100 MG TAB: 100 | 30 days supply | Qty: 60 | Fill #0

## 2019-05-07 MED FILL — ARIPiprazole 5 MG TABS: 5 | 30 days supply | Qty: 30 | Fill #0

## 2019-05-07 MED FILL — BUPROPION HCL ER (SMOKING D: 150 | 30 days supply | Qty: 60 | Fill #0

## 2019-07-16 ENCOUNTER — Other Ambulatory Visit: Payer: Self-pay | Admitting: Internal Medicine

## 2019-07-16 DIAGNOSIS — E1165 Type 2 diabetes mellitus with hyperglycemia: Secondary | ICD-10-CM

## 2019-07-16 DIAGNOSIS — E1142 Type 2 diabetes mellitus with diabetic polyneuropathy: Secondary | ICD-10-CM

## 2019-07-16 DIAGNOSIS — IMO0002 Reserved for concepts with insufficient information to code with codable children: Secondary | ICD-10-CM

## 2019-07-16 MED FILL — LISINOPRIL 10 MG TABS: 10 | 30 days supply | Qty: 30 | Fill #3

## 2019-07-16 MED FILL — SERTRALINE HCL 100 MG TAB: 100 | 30 days supply | Qty: 60 | Fill #0

## 2019-07-16 MED FILL — TRULICITY 0.75 MG/0.5 ML PE: 0.75 | 28 days supply | Qty: 2 | Fill #5

## 2019-07-16 MED FILL — ?METOPROLOL TART 25MG TABLE: 25 | 30 days supply | Qty: 60 | Fill #2

## 2019-07-16 MED FILL — BUPROPION SR 150 MG TABLET: 150 | 30 days supply | Qty: 60 | Fill #0

## 2019-07-16 MED FILL — GABAPENTIN 300 MG CAPSULE: 300 | 30 days supply | Qty: 270 | Fill #4

## 2019-07-16 MED FILL — ARIPiprazole 5 MG TABS: 5 | 30 days supply | Qty: 30 | Fill #0

## 2019-07-16 MED FILL — ALBUTEROL SULFATE HFA 108 (: 108 (90 BAS | 16 days supply | Qty: 18 | Fill #0

## 2019-08-15 ENCOUNTER — Other Ambulatory Visit: Payer: Self-pay | Admitting: Internal Medicine

## 2019-08-15 DIAGNOSIS — E1142 Type 2 diabetes mellitus with diabetic polyneuropathy: Secondary | ICD-10-CM

## 2019-08-15 MED FILL — ALBUTEROL SULFATE HFA 108 (: 108 (90 BAS | 16 days supply | Qty: 18 | Fill #0

## 2019-08-15 MED FILL — ARIPiprazole 5 MG TABS: 5 | 30 days supply | Qty: 30 | Fill #0

## 2019-08-15 MED FILL — SERTRALINE HCL 100 MG TAB: 100 | 30 days supply | Qty: 60 | Fill #0

## 2019-08-15 MED FILL — ?METOPROLOL TART 25MG TABLE: 25 | 30 days supply | Qty: 60 | Fill #2

## 2019-08-15 MED FILL — BUPROPION HCL ER (SMOKING D: 150 | 30 days supply | Qty: 60 | Fill #0

## 2019-08-15 MED FILL — TRULICITY 0.75 MG/0.5 ML PE: 0.75 | 28 days supply | Qty: 2 | Fill #5

## 2019-08-15 MED FILL — LISINOPRIL 10 MG TABS: 10 | 30 days supply | Qty: 30 | Fill #3

## 2019-08-16 MED FILL — GLIMEPIRIDE 4 MG TABS: 4 | 30 days supply | Qty: 60 | Fill #0

## 2019-08-16 MED FILL — MELOXICAM 15 MG TABLET: 15 | 30 days supply | Qty: 30 | Fill #0

## 2019-09-12 ENCOUNTER — Other Ambulatory Visit: Payer: Self-pay | Admitting: Internal Medicine

## 2019-09-12 DIAGNOSIS — IMO0002 Reserved for concepts with insufficient information to code with codable children: Secondary | ICD-10-CM

## 2019-09-12 DIAGNOSIS — E119 Type 2 diabetes mellitus without complications: Secondary | ICD-10-CM

## 2019-09-12 MED FILL — TRULICITY 0.75 MG/0.5 ML PE: 0.75 | 28 days supply | Qty: 2 | Fill #6

## 2019-09-12 MED FILL — SERTRALINE HCL 100 MG TAB: 100 | 30 days supply | Qty: 60 | Fill #1

## 2019-09-12 MED FILL — GABAPENTIN 300 MG CAPSULE: 300 | 30 days supply | Qty: 270 | Fill #4

## 2019-09-12 MED FILL — GLIMEPIRIDE 4 MG TABS: 4 | 30 days supply | Qty: 60 | Fill #1

## 2019-09-12 MED FILL — ARIPiprazole 5 MG TABS: 5 | 30 days supply | Qty: 30 | Fill #0

## 2019-09-12 MED FILL — LISINOPRIL 10 MG TABS: 10 | 30 days supply | Qty: 30 | Fill #3

## 2019-09-12 MED FILL — ?METOPROLOL TART 25MG TABLE: 25 | 30 days supply | Qty: 60 | Fill #3

## 2019-09-12 MED FILL — BUPROPION HCL ER (SMOKING D: 150 | 30 days supply | Qty: 60 | Fill #1

## 2019-09-12 NOTE — Telephone Encounter (Signed)
Requested medication (s) are due for refill today: no  Requested medication (s) are on the active medication list: yes  Future visit scheduled: no  Notes to clinic: script has expire Please review for use   Requested Prescriptions  Pending Prescriptions Disp Refills   glucose blood test strip [Pharmacy Med Name: TRUE METRIX TEST STRIP] 100 each 12    Sig: Use as instructed      Endocrinology: Diabetes - Testing Supplies Passed - 09/12/2019  1:32 PM      Passed - Valid encounter within last 12 months    Recent Outpatient Visits           10 months ago Type 2 diabetes, controlled, with peripheral neuropathy ([employer])   [place] Karle Plumber B, MD   11 months ago Type 2 diabetes, controlled, with peripheral neuropathy [facility])   [place] Ladell Pier, MD   1 year ago Diabetes mellitus type 2, uncontrolled, with complications [facility])   [place] Ladell Pier, MD   1 year ago Diabetes mellitus type 2, uncontrolled, with complications [facility])   [place] Karle Plumber B, MD   2 years ago Controlled type 2 diabetes mellitus without complication, without long-term current use of insulin [facility])   [place] [facility] Ladell Pier, MD

## 2019-09-12 NOTE — Telephone Encounter (Signed)
Requested medication (s) are due for refill today: no  Requested medication (s) are on the active medication list: yes  Last refill:  08/15/2019  Future visit scheduled: no  Notes to clinic:  patient is overdue for follow up Looks like patient is requesting medication too early  Requested Prescriptions  Pending Prescriptions Disp Refills   albuterol (VENTOLIN HFA) 108 (90 Base) MCG/ACT inhaler [Pharmacy Med Name: ALBUTEROL SULFATE HFA 108 ( 108 (90 BAS Aerosol] 18 g 0    Sig: INHALE 2 PUFFS INTO THE LUNGS EVERY 4 HOURS AS NEEDED FOR WHEEZING OR SHORTNESS OF BREATH.      Pulmonology:  Beta Agonists Failed - 09/12/2019  1:08 PM      Failed - One inhaler should last at least one month. If the patient is requesting refills earlier, contact the patient to check for uncontrolled symptoms.      Passed - Valid encounter within last 12 months    Recent Outpatient Visits           10 months ago Type 2 diabetes, controlled, with peripheral neuropathy ([place])   [place] Karle Plumber B, MD   11 months ago Type 2 diabetes, controlled, with peripheral neuropathy [facility])   [place] Ladell Pier, MD   1 year ago Diabetes mellitus type 2, uncontrolled, with complications [facility])   [place] Ladell Pier, MD   1 year ago Diabetes mellitus type 2, uncontrolled, with complications [facility])   [place] Karle Plumber B, MD   2 years ago Controlled type 2 diabetes mellitus without complication, without long-term current use of insulin [facility])   [place] [facility] Ladell Pier, MD

## 2019-09-21 MED FILL — TRUE METRIX TEST STRIP: 25 days supply | Qty: 100 | Fill #0

## 2019-11-07 ENCOUNTER — Encounter (HOSPITAL_COMMUNITY): Payer: Self-pay | Admitting: Psychiatry

## 2019-11-07 ENCOUNTER — Other Ambulatory Visit: Payer: Self-pay

## 2019-11-07 ENCOUNTER — Telehealth (INDEPENDENT_AMBULATORY_CARE_PROVIDER_SITE_OTHER): Payer: No Payment, Other | Admitting: Psychiatry

## 2019-11-07 DIAGNOSIS — Z720 Tobacco use: Secondary | ICD-10-CM | POA: Diagnosis not present

## 2019-11-07 DIAGNOSIS — F331 Major depressive disorder, recurrent, moderate: Secondary | ICD-10-CM

## 2019-11-07 DIAGNOSIS — F411 Generalized anxiety disorder: Secondary | ICD-10-CM

## 2019-11-07 DIAGNOSIS — F4310 Post-traumatic stress disorder, unspecified: Secondary | ICD-10-CM | POA: Insufficient documentation

## 2019-11-07 MED ORDER — BUSPIRONE HCL 10 MG PO TABS: 10.0000 mg | ORAL_TABLET | Freq: Three times a day (TID) | ORAL | 2 refills | Status: DC

## 2019-11-07 MED ORDER — SERTRALINE HCL 100 MG PO TABS: 200.0000 mg | ORAL_TABLET | Freq: Every day | ORAL | 2 refills | Status: DC

## 2019-11-07 MED ORDER — MELATONIN 3 MG PO TABS: 3.0000 mg | ORAL_TABLET | Freq: Every day | ORAL | 2 refills | Status: DC

## 2019-11-07 MED ORDER — BUPROPION HCL ER (SR) 150 MG PO TB12: 150.0000 mg | ORAL_TABLET | Freq: Two times a day (BID) | ORAL | 4 refills | Status: DC

## 2019-11-07 MED ORDER — ARIPIPRAZOLE 5 MG PO TABS: 5.0000 mg | ORAL_TABLET | Freq: Every day | ORAL | 2 refills | Status: DC

## 2019-11-07 MED FILL — busPIRone HCL 10 MG TABS: 10 | 30 days supply | Qty: 90 | Fill #0

## 2019-11-07 MED FILL — SERTRALINE HCL 100 MG TAB: 100 | 30 days supply | Qty: 60 | Fill #0

## 2019-11-07 MED FILL — BUPROPION SR 150 MG TABLET: 150 | 30 days supply | Qty: 60 | Fill #0

## 2019-11-07 MED FILL — ARIPiprazole 5 MG TABS: 5 | 30 days supply | Qty: 30 | Fill #0
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
## 2019-12-07 ENCOUNTER — Encounter: Payer: Self-pay | Admitting: Internal Medicine

## 2019-12-28 ENCOUNTER — Ambulatory Visit: Payer: Medicaid Other | Admitting: Internal Medicine

## 2019-12-31 ENCOUNTER — Other Ambulatory Visit: Payer: Self-pay | Admitting: Internal Medicine

## 2019-12-31 ENCOUNTER — Encounter: Payer: Self-pay | Admitting: Internal Medicine

## 2019-12-31 ENCOUNTER — Other Ambulatory Visit: Payer: Self-pay

## 2019-12-31 ENCOUNTER — Ambulatory Visit (HOSPITAL_BASED_OUTPATIENT_CLINIC_OR_DEPARTMENT_OTHER): Payer: Self-pay | Admitting: Pharmacist

## 2019-12-31 ENCOUNTER — Ambulatory Visit: Payer: Self-pay | Attending: Internal Medicine | Admitting: Internal Medicine

## 2019-12-31 VITALS — BP 130/90 | HR 92 | Resp 16 | Ht 66.0 in | Wt 230.2 lb

## 2019-12-31 DIAGNOSIS — F32A Depression, unspecified: Secondary | ICD-10-CM

## 2019-12-31 DIAGNOSIS — Z23 Encounter for immunization: Secondary | ICD-10-CM

## 2019-12-31 DIAGNOSIS — G8929 Other chronic pain: Secondary | ICD-10-CM

## 2019-12-31 DIAGNOSIS — Z1231 Encounter for screening mammogram for malignant neoplasm of breast: Secondary | ICD-10-CM

## 2019-12-31 DIAGNOSIS — G4733 Obstructive sleep apnea (adult) (pediatric): Secondary | ICD-10-CM

## 2019-12-31 DIAGNOSIS — M5442 Lumbago with sciatica, left side: Secondary | ICD-10-CM

## 2019-12-31 DIAGNOSIS — Z720 Tobacco use: Secondary | ICD-10-CM

## 2019-12-31 DIAGNOSIS — Z9989 Dependence on other enabling machines and devices: Secondary | ICD-10-CM

## 2019-12-31 DIAGNOSIS — I10 Essential (primary) hypertension: Secondary | ICD-10-CM

## 2019-12-31 DIAGNOSIS — T7491XA Unspecified adult maltreatment, confirmed, initial encounter: Secondary | ICD-10-CM | POA: Insufficient documentation

## 2019-12-31 DIAGNOSIS — Z1159 Encounter for screening for other viral diseases: Secondary | ICD-10-CM

## 2019-12-31 DIAGNOSIS — F419 Anxiety disorder, unspecified: Secondary | ICD-10-CM

## 2019-12-31 DIAGNOSIS — E1142 Type 2 diabetes mellitus with diabetic polyneuropathy: Secondary | ICD-10-CM

## 2019-12-31 LAB — POCT GLYCOSYLATED HEMOGLOBIN (HGB A1C): HbA1c, POC (prediabetic range): 6.4 % (ref 5.7–6.4)

## 2019-12-31 LAB — GLUCOSE, POCT (MANUAL RESULT ENTRY): POC Glucose: 164 mg/dl — AB (ref 70–99)

## 2019-12-31 MED ORDER — ATORVASTATIN CALCIUM 10 MG PO TABS: 10.0000 mg | ORAL_TABLET | Freq: Every day | ORAL | 5 refills | Status: DC

## 2019-12-31 MED ORDER — ASPIRIN EC 81 MG PO TBEC: 81.0000 mg | DELAYED_RELEASE_TABLET | Freq: Every day | ORAL | 11 refills | Status: AC

## 2019-12-31 MED ORDER — LISINOPRIL 5 MG PO TABS: 5.0000 mg | ORAL_TABLET | Freq: Every day | ORAL | 5 refills | Status: DC

## 2019-12-31 MED ORDER — GABAPENTIN 300 MG PO CAPS: ORAL_CAPSULE | ORAL | 4 refills | Status: DC

## 2019-12-31 MED FILL — ATORVASTATIN 10 MG TABLET: 10 | 30 days supply | Qty: 30 | Fill #0

## 2019-12-31 MED FILL — GABAPENTIN 300 MG CAPSULE: 300 | 30 days supply | Qty: 270 | Fill #0

## 2019-12-31 MED FILL — LISINOPRIL 5 MG TABLET: 5 | 30 days supply | Qty: 30 | Fill #0
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
## 2019-12-31 NOTE — Progress Notes (Signed)
After obtaining consent, and per orders of Dr. Wynetta Emery, injection of Fluarix given by Tresa Endo. Patient instructed to remain in clinic for 20 minutes afterwards, and to report any adverse reaction to me immediately.  Benard Halsted, PharmD, [place] [phone]

## 2019-12-31 NOTE — Patient Instructions (Signed)
Influenza Virus Vaccine injection (Fluarix) What is this medicine? INFLUENZA VIRUS VACCINE (in floo EN zuh VAHY ruhs vak SEEN) helps to reduce the risk of getting influenza also known as the flu. This medicine may be used for other purposes; ask your health care provider or pharmacist if you have questions. COMMON BRAND NAME(S): Fluarix, Fluzone What should I tell my health care provider before I take this medicine? They need to know if you have any of these conditions:  bleeding disorder like hemophilia  fever or infection  Guillain-Barre syndrome or other neurological problems  immune system problems  infection with the human immunodeficiency virus (HIV) or AIDS  low blood platelet counts  multiple sclerosis  an unusual or allergic reaction to influenza virus vaccine, eggs, chicken proteins, latex, gentamicin, other medicines, foods, dyes or preservatives  pregnant or trying to get pregnant  breast-feeding How should I use this medicine? This vaccine is for injection into a muscle. It is given by a health care professional. A copy of Vaccine Information Statements will be given before each vaccination. Read this sheet carefully each time. The sheet may change frequently. Talk to your pediatrician regarding the use of this medicine in children. Special care may be needed. Overdosage: If you think you have taken too much of this medicine contact a poison control center or emergency room at once. NOTE: This medicine is only for you. Do not share this medicine with others. What if I miss a dose? This does not apply. What may interact with this medicine?  chemotherapy or radiation therapy  medicines that lower your immune system like etanercept, anakinra, infliximab, and adalimumab  medicines that treat or prevent blood clots like warfarin  phenytoin  steroid medicines like prednisone or cortisone  theophylline  vaccines This list may not describe all possible  interactions. Give your health care provider a list of all the medicines, herbs, non-prescription drugs, or dietary supplements you use. Also tell them if you smoke, drink alcohol, or use illegal drugs. Some items may interact with your medicine. What should I watch for while using this medicine? Report any side effects that do not go away within 3 days to your doctor or health care professional. Call your health care provider if any unusual symptoms occur within 6 weeks of receiving this vaccine. You may still catch the flu, but the illness is not usually as bad. You cannot get the flu from the vaccine. The vaccine will not protect against colds or other illnesses that may cause fever. The vaccine is needed every year. What side effects may I notice from receiving this medicine? Side effects that you should report to your doctor or health care professional as soon as possible:  allergic reactions like skin rash, itching or hives, swelling of the face, lips, or tongue Side effects that usually do not require medical attention (report to your doctor or health care professional if they continue or are bothersome):  fever  headache  muscle aches and pains  pain, tenderness, redness, or swelling at site where injected  weak or tired This list may not describe all possible side effects. Call your doctor for medical advice about side effects. You may report side effects to FDA at 1-800-FDA-1088. Where should I keep my medicine? This vaccine is only given in a clinic, pharmacy, doctor's office, or other health care setting and will not be stored at home. NOTE: This sheet is a summary. It may not cover all possible information. If you have questions   about this medicine, talk to your doctor, pharmacist, or health care provider.  2020 Elsevier/Gold Standard (2007-09-27 09:30:40)  

## 2020-01-01 LAB — COMPREHENSIVE METABOLIC PANEL
ALT: 12 IU/L (ref 0–32)
AST: 7 IU/L (ref 0–40)
Albumin/Globulin Ratio: 1.6 (ref 1.2–2.2)
Albumin: 3.9 g/dL (ref 3.8–4.9)
Alkaline Phosphatase: 98 IU/L (ref 44–121)
BUN/Creatinine Ratio: 15 (ref 9–23)
BUN: 16 mg/dL (ref 6–24)
Bilirubin Total: <0.2 mg/dL (ref 0.0–1.2)
CO2: 25 mmol/L (ref 20–29)
Calcium: 9.8 mg/dL (ref 8.7–10.2)
Chloride: 104 mmol/L (ref 96–106)
Creatinine, Ser: 1.08 mg/dL — ABNORMAL HIGH (ref 0.57–1.00)
GFR calc Af Amer: 67 mL/min/{1.73_m2} (ref >59)
GFR calc non Af Amer: 58 mL/min/{1.73_m2} — ABNORMAL LOW (ref >59)
Globulin, Total: 2.4 g/dL (ref 1.5–4.5)
Glucose: 134 mg/dL — ABNORMAL HIGH (ref 65–99)
Potassium: 4.1 mmol/L (ref 3.5–5.2)
Sodium: 143 mmol/L (ref 134–144)
Total Protein: 6.3 g/dL (ref 6.0–8.5)

## 2020-01-01 LAB — CBC
Hematocrit: 47.5 % — ABNORMAL HIGH (ref 34.0–46.6)
Hemoglobin: 16.4 g/dL — ABNORMAL HIGH (ref 11.1–15.9)
MCH: 31.8 pg (ref 26.6–33.0)
MCHC: 34.5 g/dL (ref 31.5–35.7)
MCV: 92 fL (ref 79–97)
Platelets: 300 10*3/uL (ref 150–450)
RBC: 5.16 x10E6/uL (ref 3.77–5.28)
RDW: 12.5 % (ref 11.7–15.4)
WBC: 10.3 10*3/uL (ref 3.4–10.8)

## 2020-01-01 LAB — LIPID PANEL
Chol/HDL Ratio: 5.1 ratio — ABNORMAL HIGH (ref 0.0–4.4)
Cholesterol, Total: 205 mg/dL — ABNORMAL HIGH (ref 100–199)
HDL: 40 mg/dL (ref >39)
LDL Chol Calc (NIH): 137 mg/dL — ABNORMAL HIGH (ref 0–99)
Triglycerides: 154 mg/dL — ABNORMAL HIGH (ref 0–149)
VLDL Cholesterol Cal: 28 mg/dL (ref 5–40)

## 2020-01-01 LAB — HEPATITIS C ANTIBODY: Hep C Virus Ab: <0.1 s/co ratio (ref 0.0–0.9)

## 2020-01-01 LAB — MICROALBUMIN / CREATININE URINE RATIO
Creatinine, Urine: 137.4 mg/dL
Microalb/Creat Ratio: 4 mg/g creat (ref 0–29)
Microalbumin, Urine: 5.7 ug/mL

## 2020-01-02 ENCOUNTER — Encounter: Payer: Self-pay | Admitting: Internal Medicine

## 2020-01-02 MED ORDER — SULFAMETHOXAZOLE-TRIMETHOPRIM 800-160 MG PO TABS: 1.0000 | ORAL_TABLET | Freq: Two times a day (BID) | ORAL | 0 refills | Status: DC

## 2020-01-02 MED FILL — SULFAMETHOXAZOLE-TMP DS TAB: 800-160 | 7 days supply | Qty: 14 | Fill #0

## 2020-01-04 ENCOUNTER — Telehealth: Payer: Self-pay | Admitting: Internal Medicine

## 2020-01-04 NOTE — Telephone Encounter (Signed)
Pt paperwork has been faxed!Tracy Fitzgerald was faxed on 10/21

## 2020-01-04 NOTE — Telephone Encounter (Signed)
Copied from [place] [phone]. Topic: General - Other >> Jan 04, 2020 11:25 AM Yvette Rack wrote: Reason for CRM: Pt called to see if paperwork from [facility] was received. Pt requests call back to advise if paperwork was received, completed and faxed back to [place]. Pt requests that a detailed message be left if she does not answer. Cb# [phone]

## 2020-01-07 ENCOUNTER — Ambulatory Visit: Payer: Medicaid Other | Attending: Internal Medicine | Admitting: Pharmacist

## 2020-01-07 ENCOUNTER — Other Ambulatory Visit: Payer: Self-pay

## 2020-01-07 DIAGNOSIS — Z23 Encounter for immunization: Secondary | ICD-10-CM

## 2020-01-07 NOTE — Progress Notes (Signed)
Patient presents for vaccination against zoster per orders of Dr. Wynetta Emery. Consent given. Counseling provided. No contraindications exists. Vaccine administered without incident.   Benard Halsted, PharmD, [place] [phone]

## 2020-01-09 ENCOUNTER — Ambulatory Visit (HOSPITAL_COMMUNITY): Payer: Self-pay | Admitting: Licensed Clinical Social Worker

## 2020-01-16 ENCOUNTER — Encounter: Payer: Self-pay | Admitting: Internal Medicine

## 2020-02-11 ENCOUNTER — Encounter (HOSPITAL_COMMUNITY): Payer: Self-pay | Admitting: Psychiatry

## 2020-02-18 ENCOUNTER — Encounter (HOSPITAL_COMMUNITY): Payer: Self-pay | Admitting: Psychiatry

## 2020-02-18 ENCOUNTER — Other Ambulatory Visit (HOSPITAL_COMMUNITY): Payer: Self-pay | Admitting: Psychiatry

## 2020-02-18 ENCOUNTER — Other Ambulatory Visit: Payer: Self-pay

## 2020-02-18 ENCOUNTER — Ambulatory Visit (INDEPENDENT_AMBULATORY_CARE_PROVIDER_SITE_OTHER): Payer: No Payment, Other | Admitting: Psychiatry

## 2020-02-18 ENCOUNTER — Telehealth (HOSPITAL_COMMUNITY): Payer: Self-pay | Admitting: Internal Medicine

## 2020-02-18 DIAGNOSIS — F411 Generalized anxiety disorder: Secondary | ICD-10-CM | POA: Diagnosis not present

## 2020-02-18 DIAGNOSIS — F331 Major depressive disorder, recurrent, moderate: Secondary | ICD-10-CM

## 2020-02-18 DIAGNOSIS — Z720 Tobacco use: Secondary | ICD-10-CM

## 2020-02-18 MED ORDER — BUSPIRONE HCL 10 MG PO TABS
10.0000 mg | ORAL_TABLET | Freq: Three times a day (TID) | ORAL | 2 refills | Status: DC
  Filled 2020-09-12: qty 90, 30d supply, fill #0
  Filled 2020-11-14: qty 90, 30d supply, fill #1
  Filled 2020-12-30: qty 90, 30d supply, fill #2

## 2020-02-18 MED ORDER — BUPROPION HCL ER (SR) 150 MG PO TB12: 150.0000 mg | ORAL_TABLET | Freq: Two times a day (BID) | ORAL | 2 refills | Status: DC

## 2020-02-18 MED ORDER — IMIPRAMINE HCL 25 MG PO TABS: 25.0000 mg | ORAL_TABLET | Freq: Every day | ORAL | 2 refills | Status: DC

## 2020-02-18 MED ORDER — MELATONIN 3 MG PO TABS: 3.0000 mg | ORAL_TABLET | Freq: Every day | ORAL | 2 refills | Status: DC

## 2020-02-18 MED ORDER — ARIPIPRAZOLE 5 MG PO TABS
5.0000 mg | ORAL_TABLET | Freq: Every day | ORAL | 2 refills | Status: DC
  Filled 2020-09-12: qty 30, 30d supply, fill #0
  Filled 2020-11-14: qty 30, 30d supply, fill #1

## 2020-02-18 MED FILL — IMIPRAMINE HCL 25 MG TABLET: 25 | 30 days supply | Qty: 30 | Fill #0

## 2020-02-18 MED FILL — ARIPiprazole 5 MG TABS: 5 | 30 days supply | Qty: 30 | Fill #0

## 2020-02-18 MED FILL — busPIRone HCL 10 MG TABS: 10 | 30 days supply | Qty: 90 | Fill #0

## 2020-02-18 MED FILL — BUPROPION SR 150 MG TABLET: 150 | 30 days supply | Qty: 60 | Fill #0

## 2020-02-18 NOTE — Progress Notes (Signed)
BH MD/PA/NP OP Progress Note  02/18/2020 2:28 PM Tracy Fitzgerald  MRN:  [id number]  Chief Complaint: "I hav'ent been taking my medications because I can't afford them" Chief Complaint    Follow-up; Medication Management     HPI: 54 year old female seen today for follow up psychiatric evaluation.  She has a psychiatric history of anxiety and depression.  She is currently being managed on Abilify 5 mg, Wellbitrin SR 150 twice daily, Zoloft 200 mg daily, BuSpar10 mg three times daily, and gabapentin 600 mg twice daily (she has this filled by her PCP for neuropathy), and melatonin 3 mg nightly .  Patient noted that she has not taken her psychiatric medications in over two months because she could not afford it.   Today she is well-groomed, pleasant, cooperative, engaged in conversation, and maintained eye contact.  She informed provider that her mood is anxious.  She noted that recently she saw her ex-husband which she reported shock her.  Provider conducted a GAD-7 and patient scored a 7.  Provider also conducted a PHQ-9 and patient scored a 16.  She endorses anhedonia, poor concentration, fatigue, insomnia (noting that she sleeps 4 hours nightly), and weight loss (however noted she has been dieting and trying to lose weight).  At times she notes that she thinks about suicide however informed provider that she would never harm herself.  She denies SI/HI/VAH or paranoia.  Patient informed provider that since she has lost weight her mood has improved.  She noted that although she is in pain most days she is able to ambulate more at work and at home.  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  She noted her coworkers are supportive and understanding.  She also informed provider that she continues to live with her friend to help out with her friends children.  She informed provider that she needs more money to support herself  noting that she has difficulties paying for her medications as they are over $100.  Provider referred patient to community health and wellness as well as the care management team.  She is agreeable to starting imipramine 25 mg nightly to help manage depression, anxiety, and sleep.  She will discontinue Zoloft as she has not taken it in over 2 months.  She will continue all other medications as prescribed.  She will follow-up with outpatient counseling for therapy.  No other concerns noted at this time. Visit Diagnosis:    ICD-10-CM   1. Generalized anxiety disorder  F41.1 busPIRone (BUSPAR) 10 MG tablet    buPROPion (WELLBUTRIN SR) 150 MG 12 hr tablet    imipramine (TOFRANIL) 25 MG tablet  2. Major depressive disorder, recurrent episode, moderate (HCC)  F33.1 melatonin 3 MG TABS tablet    buPROPion (WELLBUTRIN SR) 150 MG 12 hr tablet    imipramine (TOFRANIL) 25 MG tablet    ARIPiprazole (ABILIFY) 5 MG tablet  3. Tobacco abuse  Z72.0 buPROPion (WELLBUTRIN SR) 150 MG 12 hr tablet    Past Psychiatric History: Depression and anxiety  Past Medical History:  Past Medical History:  Diagnosis Date  . Asthma   . CHF (congestive heart failure) ([place])   . Depression   . Diabetes mellitus without complication ([place])   . Headache   . Hypertension   . Pain disorder   . Sleep apnea    CPAP    Past Surgical History:  Procedure Laterality Date  .  ADENOIDECTOMY    . COLONOSCOPY WITH PROPOFOL N/A 05/24/2017   Procedure: COLONOSCOPY WITH PROPOFOL;  Surgeon: Jerene Bears, MD;  Location: [facility];  Service: Gastroenterology;  Laterality: N/A;  . TONSILLECTOMY      Family Psychiatric History: Mother depression, Son ADHD, Daughter ADD                                         Family History:  Family History  Problem Relation Age of Onset  . Hypertension Mother   . Diabetes Mother   . Uterine cancer Mother   . Heart disease Mother   . Cancer Mother        UTERINE CANCER   . Arthritis Mother    . Hypertension Father   . Kidney disease Father   . Breast cancer Neg Hx   . Colon cancer Neg Hx     Social History:  Social History   Socioeconomic History  . Marital status: Married    Spouse name: Not on file  . Number of children: Not on file  . Years of education: Not on file  . Highest education level: Not on file  Occupational History  . Not on file  Tobacco Use  . Smoking status: Current Every Day Smoker    Packs/day: 1.00    Years: 33.00    Pack years: 33.00    Types: Cigarettes  . Smokeless tobacco: Current User  Vaping Use  . Vaping Use: Never used  Substance and Sexual Activity  . Alcohol use: Yes    Comment: occ. monthly  . Drug use: No  . Sexual activity: Yes    Partners: Male    Birth control/protection: Post-menopausal  Other Topics Concern  . Not on file  Social History Narrative  . Not on file   Social Determinants of Health   Financial Resource Strain:   . Difficulty of Paying Living Expenses: Not on file  Food Insecurity:   . Worried About Charity fundraiser in the Last Year: Not on file  . Ran Out of Food in the Last Year: Not on file  Transportation Needs:   . Lack of Transportation (Medical): Not on file  . Lack of Transportation (Non-Medical): Not on file  Physical Activity:   . Days of Exercise per Week: Not on file  . Minutes of Exercise per Session: Not on file  Stress:   . Feeling of Stress : Not on file  Social Connections:   . Frequency of Communication with Friends and Family: Not on file  . Frequency of Social Gatherings with Friends and Family: Not on file  . Attends Religious Services: Not on file  . Active Member of Clubs or Organizations: Not on file  . Attends Archivist Meetings: Not on file  . Marital Status: Not on file    Allergies:  Allergies  Allergen Reactions  . Cymbalta [Duloxetine Hcl]     Urinary retention    Metabolic Disorder Labs: Lab Results  Component Value Date   HGBA1C 6.4  12/31/2019   No results found for: PROLACTIN Lab Results  Component Value Date   CHOL 205 (H) 12/31/2019   TRIG 154 (H) 12/31/2019   HDL 40 12/31/2019   CHOLHDL 5.1 (H) 12/31/2019   VLDL 30 06/19/2014   LDLCALC 137 (H) 12/31/2019   LDLCALC 105 (H) 10/13/2018   Lab Results  Component  Value Date   TSH 0.349 (L) 06/17/2014    Therapeutic Level Labs: No results found for: LITHIUM No results found for: VALPROATE No components found for:  CBMZ  Current Medications: Current Outpatient Medications  Medication Sig Dispense Refill  . albuterol (VENTOLIN HFA) 108 (90 Base) MCG/ACT inhaler INHALE 2 PUFFS INTO THE LUNGS EVERY 4 HOURS AS NEEDED FOR WHEEZING OR SHORTNESS OF BREATH. 18 g 0  . ARIPiprazole (ABILIFY) 5 MG tablet Take 1 tablet (5 mg total) by mouth daily. 30 tablet 2  . aspirin EC 81 MG tablet Take 1 tablet (81 mg total) by mouth daily. Swallow whole. 30 tablet 11  . Aspirin-Salicylamide-Caffeine (BC HEADACHE POWDER PO) Take 1 packet by mouth 3 (three) times daily as needed (pain).    Marland Kitchen atorvastatin (LIPITOR) 10 MG tablet Take 1 tablet (10 mg total) by mouth daily. 30 tablet 5  . Blood Glucose Monitoring Suppl (TRUE METRIX METER) w/Device KIT Use as directed 1 kit 0  . buPROPion (WELLBUTRIN SR) 150 MG 12 hr tablet Take 1 tablet (150 mg total) by mouth 2 (two) times daily. 60 tablet 2  . busPIRone (BUSPAR) 10 MG tablet Take 1 tablet (10 mg total) by mouth 3 (three) times daily. 90 tablet 2  . Cholecalciferol (VITAMIN D PO) Take 1 tablet by mouth daily.     Marland Kitchen gabapentin (NEURONTIN) 300 MG capsule TAKE 2 CAPSULES BY MOUTH 3 TIMES DAILY and 3 caps at bedtime. 270 capsule 4  . glucose blood test strip Use as instructed 100 each 12  . Hyoscyamine Sulfate SL (LEVSIN/SL) 0.125 MG SUBL Place 0.125 mg under the tongue every 4 (four) hours as needed. (Patient taking differently: Place 0.125 mg under the tongue every 4 (four) hours as needed (stomach pain). ) 40 each 0  . lisinopril (ZESTRIL)  5 MG tablet Take 1 tablet (5 mg total) by mouth daily. 30 tablet 5  . melatonin 3 MG TABS tablet Take 1 tablet (3 mg total) by mouth at bedtime. 30 tablet 2  . meloxicam (MOBIC) 15 MG tablet TAKE 1 TABLET BY MOUTH DAILY. 30 tablet 1  . metoprolol tartrate (LOPRESSOR) 25 MG tablet Take 1 tablet (25 mg total) by mouth 2 (two) times daily. 60 tablet 11  . sulfamethoxazole-trimethoprim (BACTRIM DS) 800-160 MG tablet Take 1 tablet by mouth 2 (two) times daily. 14 tablet 0  . TRUEplus Lancets 28G MISC USE AS DIRECTED 100 each 2  . imipramine (TOFRANIL) 25 MG tablet Take 1 tablet (25 mg total) by mouth at bedtime. 30 tablet 2   No current facility-administered medications for this visit.     Musculoskeletal: Strength & Muscle Tone: decreased Gait & Station: unsteady, Walks with a cane Patient leans: N/A  Psychiatric Specialty Exam: Review of Systems  Blood pressure (!) 143/87, pulse (!) 102, height '5\' 6"'  (1.676 m), weight 214 lb (97.1 kg), last menstrual period 11/23/2015.Body mass index is 34.54 kg/m.  General Appearance: Well Groomed  Eye Contact:  Good  Speech:  Clear and Coherent and Normal Rate  Volume:  Normal  Mood:  Anxious and Depressed  Affect:  Appropriate and Congruent  Thought Process:  Coherent, Goal Directed and Linear  Orientation:  Full (Time, Place, and Person)  Thought Content: WDL and Logical   Suicidal Thoughts:  No  Homicidal Thoughts:  No  Memory:  Immediate;   Good Recent;   Good Remote;   Good  Judgement:  Good  Insight:  Good  Psychomotor Activity:  Normal  Concentration:  Concentration: Good and Attention Span: Good  Recall:  Good  Fund of Knowledge: Good  Language: Good  Akathisia:  No  Handed:  Ambidextrous  AIMS (if indicated): Not done   Assets:  Communication Skills Desire for Improvement Financial Resources/Insurance Housing Social Support  ADL's:  Intact  Cognition: WNL  Sleep:  Fair   Screenings: GAD-7     Clinical Support from  02/18/2020 in [facility] Office Visit from 12/31/2019 in [place] Office Visit from 10/13/2018 in [place] Procedure visit from 02/28/2017 in [place] for [facility] Office Visit from 01/11/2017 in [place]  Total GAD-7 Score '6 7 13 10 9    ' PHQ2-9     Clinical Support from 02/18/2020 in [facility] Office Visit from 12/31/2019 in [place] Office Visit from 10/13/2018 in [place] Office Visit from 06/12/2018 in [place] Office Visit from 04/22/2017 in [place]  PHQ-2 Total Score '6 6 6 6 6  ' PHQ-9 Total Score '16 21 22 22 19       ' Assessment and Plan: Patient endorses symptoms of depression, insomnia, and minimal anxiety.  She is agreeable to starting imipramine 25 mg nightly to help manage symptoms of anxiety, depression, and sleep.  She will discontinue Zoloft as she has not taken it in over 2 months.  She will continue all other medications as prescribed.  1. Generalized anxiety disorder  Continue- busPIRone (BUSPAR) 10 MG tablet; Take 1 tablet (10 mg total) by mouth 3 (three) times daily.  Dispense: 90 tablet; Refill: 2 Continue- buPROPion (WELLBUTRIN SR) 150 MG 12 hr tablet; Take 1 tablet (150 mg total) by mouth 2 (two) times daily.  Dispense: 60 tablet; Refill: 2 Start- imipramine (TOFRANIL) 25 MG tablet; Take 1 tablet (25 mg total) by mouth at bedtime.  Dispense: 30 tablet; Refill: 2  2. Major depressive disorder, recurrent episode, moderate (HCC)  Continue- melatonin 3 MG TABS tablet; Take 1 tablet (3 mg total) by mouth at bedtime.  Dispense: 30 tablet; Refill: 2 Continue- buPROPion (WELLBUTRIN SR) 150 MG 12 hr tablet; Take 1 tablet (150 mg total) by mouth 2 (two) times daily.  Dispense: 60  tablet; Refill: 2 Start- imipramine (TOFRANIL) 25 MG tablet; Take 1 tablet (25 mg total) by mouth at bedtime.  Dispense: 30 tablet; Refill: 2 Continue- ARIPiprazole (ABILIFY) 5 MG tablet; Take 1 tablet (5 mg total) by mouth daily.  Dispense: 30 tablet; Refill: 2  3. Tobacco abuse  Continue- buPROPion (WELLBUTRIN SR) 150 MG 12 hr tablet; Take 1 tablet (150 mg total) by mouth 2 (two) times daily.  Dispense: 60 tablet; Refill: 2  Follow-up in 3 months   Salley Slaughter, NP 02/18/2020, 2:28 PM

## 2020-02-18 NOTE — Telephone Encounter (Signed)
Care Management - Outpatient   Patient reports that she is experiencing financial constraints due to the cost of her medications.   Writer provided patient resources to [employer] in order to  assist her in paying for her medications.

## 2020-02-25 ENCOUNTER — Encounter (HOSPITAL_COMMUNITY): Payer: Self-pay

## 2020-02-25 ENCOUNTER — Ambulatory Visit (HOSPITAL_COMMUNITY): Payer: Self-pay | Admitting: Licensed Clinical Social Worker

## 2020-03-24 ENCOUNTER — Ambulatory Visit: Payer: Medicaid Other | Admitting: Pharmacist

## 2020-03-31 ENCOUNTER — Ambulatory Visit: Payer: Self-pay | Attending: Internal Medicine | Admitting: Internal Medicine

## 2020-03-31 ENCOUNTER — Other Ambulatory Visit: Payer: Self-pay

## 2020-03-31 DIAGNOSIS — Z91199 Patient's noncompliance with other medical treatment and regimen due to unspecified reason: Secondary | ICD-10-CM

## 2020-03-31 DIAGNOSIS — Z5329 Procedure and treatment not carried out because of patient's decision for other reasons: Secondary | ICD-10-CM

## 2020-03-31 NOTE — Progress Notes (Signed)
Patient no showed for this telephone visit.  

## 2020-05-03 ENCOUNTER — Encounter: Payer: Self-pay | Admitting: Internal Medicine

## 2020-05-19 ENCOUNTER — Encounter (HOSPITAL_COMMUNITY): Payer: No Payment, Other | Admitting: Psychiatry

## 2020-05-26 MED FILL — ?ARIPIPRAZOLE 5MG TABLET: 5 | 30 days supply | Qty: 30 | Fill #0

## 2020-05-26 MED FILL — ?ATORVASTATIN 10 MG TABLET: 10 | 30 days supply | Qty: 30 | Fill #0

## 2020-05-26 MED FILL — ?BUSPIRONE HCL 10MG TABS: 10 | 30 days supply | Qty: 90 | Fill #0

## 2020-05-26 MED FILL — ?SERTRALINE HCL 100 MG TABS: 100 | 30 days supply | Qty: 60 | Fill #0

## 2020-08-25 ENCOUNTER — Other Ambulatory Visit: Payer: Self-pay

## 2020-08-25 ENCOUNTER — Other Ambulatory Visit: Payer: Self-pay | Admitting: Internal Medicine

## 2020-08-25 DIAGNOSIS — E1142 Type 2 diabetes mellitus with diabetic polyneuropathy: Secondary | ICD-10-CM

## 2020-08-25 MED ORDER — ALBUTEROL SULFATE HFA 108 (90 BASE) MCG/ACT IN AERS
INHALATION_SPRAY | RESPIRATORY_TRACT | 0 refills | Status: DC
  Filled 2020-08-25 – 2020-09-11 (×2): qty 18, 25d supply, fill #0

## 2020-08-25 MED ORDER — GABAPENTIN 300 MG PO CAPS
ORAL_CAPSULE | ORAL | 1 refills | Status: DC
  Filled 2020-08-25 – 2020-09-11 (×2): qty 270, 30d supply, fill #0
  Filled 2020-11-14: qty 270, 30d supply, fill #1

## 2020-08-25 NOTE — Telephone Encounter (Signed)
Medication: Rx #: [id number] gabapentin (NEURONTIN) 300 MG capsule [[id number]] , albuterol (VENTOLIN HFA) 108 (90 Base) MCG/ACT inhaler [[id number]]    Has the patient contacted their pharmacy? YES  (Agent: If no, request that the patient contact the pharmacy for the refill.) (Agent: If yes, when and what did the pharmacy advise?)  Preferred Pharmacy (with phone number or street name): [address] [place] [address] Phone: [phone] Fax: [phone] Hours: M-F 8:30a-5:30p    Agent: Please be advised that RX refills may take up to 3 business days. We ask that you follow-up with your pharmacy.

## 2020-09-01 ENCOUNTER — Other Ambulatory Visit: Payer: Self-pay

## 2020-09-11 ENCOUNTER — Other Ambulatory Visit: Payer: Self-pay

## 2020-09-11 MED FILL — Bupropion HCl Tab ER 12HR 150 MG: ORAL | 30 days supply | Qty: 60 | Fill #0 | Status: AC

## 2020-09-11 MED FILL — Lisinopril Tab 5 MG: ORAL | 30 days supply | Qty: 30 | Fill #0 | Status: AC

## 2020-09-11 MED FILL — Glucose Blood Test Strip: 25 days supply | Qty: 100 | Fill #0 | Status: AC

## 2020-09-12 ENCOUNTER — Other Ambulatory Visit: Payer: Self-pay

## 2020-09-12 MED ORDER — SERTRALINE HCL 100 MG PO TABS
ORAL_TABLET | ORAL | 2 refills | Status: DC
  Filled 2020-09-12: qty 60, 30d supply, fill #0

## 2020-09-16 ENCOUNTER — Other Ambulatory Visit: Payer: Self-pay

## 2020-11-14 ENCOUNTER — Other Ambulatory Visit: Payer: Self-pay

## 2020-11-14 ENCOUNTER — Other Ambulatory Visit (HOSPITAL_COMMUNITY): Payer: Self-pay | Admitting: Psychiatry

## 2020-11-14 ENCOUNTER — Other Ambulatory Visit: Payer: Self-pay | Admitting: Internal Medicine

## 2020-11-14 MED FILL — Lisinopril Tab 5 MG: ORAL | 30 days supply | Qty: 30 | Fill #1 | Status: AC

## 2020-11-14 MED FILL — Bupropion HCl Tab ER 12HR 150 MG: ORAL | 30 days supply | Qty: 60 | Fill #1 | Status: AC

## 2020-11-14 NOTE — Telephone Encounter (Signed)
Requested medication (s) are due for refill today - yes  Requested medication (s) are on the active medication list -yes  Future visit scheduled -no  Last refill: 08/25/20  Notes to clinic: Request RF: patient passes visit protocol- but Rx has notes-must keep upcoming OV for RF- no show  Requested Prescriptions  Pending Prescriptions Disp Refills   albuterol (VENTOLIN HFA) 108 (90 Base) MCG/ACT inhaler 18 g 0    Sig: INHALE 2 PUFFS INTO THE LUNGS EVERY 4 HOURS AS NEEDED FOR WHEEZING OR SHORTNESS OF BREATH.     Pulmonology:  [facility] Agonists Failed - 11/14/2020  9:47 AM      Failed - One inhaler should last at least one month. If the patient is requesting refills earlier, contact the patient to check for uncontrolled symptoms.      Passed - Valid encounter within last 12 months    Recent Outpatient Visits           7 months ago No-show for appointment   [place] Ladell Pier, MD   10 months ago Need for zoster vaccination   [place], Jarome Matin, RPH-CPP   10 months ago Need for influenza vaccination   [place], Stephen L, RPH-CPP   10 months ago Type 2 diabetes, controlled, with peripheral neuropathy [facility])   [place] [facility] Ladell Pier, MD   1 year ago Type 2 diabetes, controlled, with peripheral neuropathy [facility])   [place], MD                 Requested Prescriptions  Pending Prescriptions Disp Refills   albuterol (VENTOLIN HFA) 108 (90 Base) MCG/ACT inhaler 18 g 0    Sig: INHALE 2 PUFFS INTO THE LUNGS EVERY 4 HOURS AS NEEDED FOR WHEEZING OR SHORTNESS OF BREATH.     Pulmonology:  Beta Agonists Failed - 11/14/2020  9:47 AM      Failed - One inhaler should last at least one month. If the patient is requesting refills earlier, contact the patient to check for  uncontrolled symptoms.      Passed - Valid encounter within last 12 months    Recent Outpatient Visits           7 months ago No-show for appointment   [place], MD   10 months ago Need for zoster vaccination   [place], RPH-CPP   10 months ago Need for influenza vaccination   [place], Stephen L, RPH-CPP   10 months ago Type 2 diabetes, controlled, with peripheral neuropathy [facility])   [place] [facility] Ladell [place], MD   1 year ago Type 2 diabetes, controlled, with peripheral neuropathy [facility])   [place] [facility] Ladell [facility], MD

## 2020-11-26 ENCOUNTER — Other Ambulatory Visit: Payer: Self-pay

## 2020-12-30 ENCOUNTER — Other Ambulatory Visit: Payer: Self-pay

## 2021-01-13 ENCOUNTER — Other Ambulatory Visit: Payer: Self-pay

## 2021-07-01 ENCOUNTER — Ambulatory Visit: Payer: Medicaid Other | Admitting: Physician Assistant

## 2021-08-06 ENCOUNTER — Encounter: Payer: Medicaid Other | Admitting: Internal Medicine

## 2022-01-21 ENCOUNTER — Other Ambulatory Visit (HOSPITAL_COMMUNITY)
Admission: RE | Admit: 2022-01-21 | Discharge: 2022-01-21 | Disposition: A | Discharge status: Home / Self Care | Payer: BC Managed Care – PPO | Source: Ambulatory Visit | Attending: Internal Medicine | Admitting: Internal Medicine

## 2022-01-21 ENCOUNTER — Other Ambulatory Visit: Payer: Self-pay

## 2022-01-21 ENCOUNTER — Ambulatory Visit: Payer: BC Managed Care – PPO | Attending: Internal Medicine | Admitting: Internal Medicine

## 2022-01-21 ENCOUNTER — Other Ambulatory Visit: Payer: Self-pay | Admitting: Internal Medicine

## 2022-01-21 VITALS — BP 156/100 | HR 83 | Temp 98.3°F | Ht 66.0 in | Wt 199.0 lb

## 2022-01-21 DIAGNOSIS — F17210 Nicotine dependence, cigarettes, uncomplicated: Secondary | ICD-10-CM | POA: Diagnosis not present

## 2022-01-21 DIAGNOSIS — I10 Essential (primary) hypertension: Secondary | ICD-10-CM | POA: Diagnosis not present

## 2022-01-21 DIAGNOSIS — Z1231 Encounter for screening mammogram for malignant neoplasm of breast: Secondary | ICD-10-CM

## 2022-01-21 DIAGNOSIS — M5430 Sciatica, unspecified side: Secondary | ICD-10-CM

## 2022-01-21 DIAGNOSIS — E1142 Type 2 diabetes mellitus with diabetic polyneuropathy: Secondary | ICD-10-CM | POA: Diagnosis not present

## 2022-01-21 DIAGNOSIS — Z6832 Body mass index (BMI) 32.0-32.9, adult: Secondary | ICD-10-CM

## 2022-01-21 DIAGNOSIS — Z124 Encounter for screening for malignant neoplasm of cervix: Secondary | ICD-10-CM | POA: Diagnosis present

## 2022-01-21 DIAGNOSIS — Z0000 Encounter for general adult medical examination without abnormal findings: Secondary | ICD-10-CM

## 2022-01-21 DIAGNOSIS — F331 Major depressive disorder, recurrent, moderate: Secondary | ICD-10-CM

## 2022-01-21 DIAGNOSIS — G4733 Obstructive sleep apnea (adult) (pediatric): Secondary | ICD-10-CM

## 2022-01-21 DIAGNOSIS — E669 Obesity, unspecified: Secondary | ICD-10-CM

## 2022-01-21 DIAGNOSIS — F411 Generalized anxiety disorder: Secondary | ICD-10-CM | POA: Diagnosis not present

## 2022-01-21 DIAGNOSIS — Z720 Tobacco use: Secondary | ICD-10-CM

## 2022-01-21 MED ORDER — LISINOPRIL 5 MG PO TABS
5.0000 mg | ORAL_TABLET | Freq: Every day | ORAL | 5 refills | Status: DC
  Filled 2022-01-21 – 2022-10-29 (×3): qty 30, 30d supply, fill #0

## 2022-01-21 MED ORDER — ATORVASTATIN CALCIUM 10 MG PO TABS
10.0000 mg | ORAL_TABLET | Freq: Every day | ORAL | 5 refills | Status: DC
  Filled 2022-01-21 – 2022-10-29 (×3): qty 30, 30d supply, fill #0

## 2022-01-21 MED ORDER — GABAPENTIN 300 MG PO CAPS
ORAL_CAPSULE | ORAL | 1 refills | Status: DC
  Filled 2022-01-21 – 2022-05-20 (×2): qty 270, 30d supply, fill #0

## 2022-01-21 MED ORDER — ARIPIPRAZOLE 5 MG PO TABS
5.0000 mg | ORAL_TABLET | Freq: Every day | ORAL | 2 refills | Status: DC
  Filled 2022-01-21 – 2022-10-29 (×3): qty 30, 30d supply, fill #0

## 2022-01-21 MED ORDER — BUPROPION HCL ER (SR) 150 MG PO TB12
150.0000 mg | ORAL_TABLET | Freq: Two times a day (BID) | ORAL | 2 refills | Status: DC
  Filled 2022-01-21 – 2022-10-29 (×3): qty 60, 30d supply, fill #0

## 2022-01-21 MED ORDER — ALBUTEROL SULFATE HFA 108 (90 BASE) MCG/ACT IN AERS
2.0000 | INHALATION_SPRAY | RESPIRATORY_TRACT | 2 refills | Status: DC | PRN
  Filled 2022-01-21: qty 18, 25d supply, fill #0
  Filled 2022-05-20: qty 6.7, 17d supply, fill #0

## 2022-01-21 MED ORDER — BUSPIRONE HCL 10 MG PO TABS
10.0000 mg | ORAL_TABLET | Freq: Three times a day (TID) | ORAL | 2 refills | Status: DC
  Filled 2022-01-21 – 2022-10-29 (×3): qty 90, 30d supply, fill #0

## 2022-01-21 MED ORDER — SERTRALINE HCL 100 MG PO TABS
200.0000 mg | ORAL_TABLET | Freq: Every day | ORAL | 2 refills | Status: DC
  Filled 2022-01-21 – 2022-10-29 (×3): qty 60, 30d supply, fill #0
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
## 2022-01-21 NOTE — Patient Instructions (Signed)
Preventive Care 40-56 Years Old, Female Preventive care refers to lifestyle choices and visits with your health care provider that can promote health and wellness. Preventive care visits are also called wellness exams. What can I expect for my preventive care visit? Counseling Your health care provider may ask you questions about your: Medical history, including: Past medical problems. Family medical history. Pregnancy history. Current health, including: Menstrual cycle. Method of birth control. Emotional well-being. Home life and relationship well-being. Sexual activity and sexual health. Lifestyle, including: Alcohol, nicotine or tobacco, and drug use. Access to firearms. Diet, exercise, and sleep habits. Work and work environment. Sunscreen use. Safety issues such as seatbelt and bike helmet use. Physical exam Your health care provider will check your: Height and weight. These may be used to calculate your BMI (body mass index). BMI is a measurement that tells if you are at a healthy weight. Waist circumference. This measures the distance around your waistline. This measurement also tells if you are at a healthy weight and may help predict your risk of certain diseases, such as type 2 diabetes and high blood pressure. Heart rate and blood pressure. Body temperature. Skin for abnormal spots. What immunizations do I need?  Vaccines are usually given at various ages, according to a schedule. Your health care provider will recommend vaccines for you based on your age, medical history, and lifestyle or other factors, such as travel or where you work. What tests do I need? Screening Your health care provider may recommend screening tests for certain conditions. This may include: Lipid and cholesterol levels. Diabetes screening. This is done by checking your blood sugar (glucose) after you have not eaten for a while (fasting). Pelvic exam and Pap test. Hepatitis B test. Hepatitis C  test. HIV (human immunodeficiency virus) test. STI (sexually transmitted infection) testing, if you are at risk. Lung cancer screening. Colorectal cancer screening. Mammogram. Talk with your health care provider about when you should start having regular mammograms. This may depend on whether you have a family history of breast cancer. BRCA-related cancer screening. This may be done if you have a family history of breast, ovarian, tubal, or peritoneal cancers. Bone density scan. This is done to screen for osteoporosis. Talk with your health care provider about your test results, treatment options, and if necessary, the need for more tests. Follow these instructions at home: Eating and drinking  Eat a diet that includes fresh fruits and vegetables, whole grains, lean protein, and low-fat dairy products. Take vitamin and mineral supplements as recommended by your health care provider. Do not drink alcohol if: Your health care provider tells you not to drink. You are pregnant, may be pregnant, or are planning to become pregnant. If you drink alcohol: Limit how much you have to 0-1 drink a day. Know how much alcohol is in your drink. In the [place] one drink equals one 12 oz bottle of beer (355 mL), one 5 oz glass of wine (148 mL), or one 1 oz glass of hard liquor (44 mL). Lifestyle Brush your teeth every morning and night with fluoride toothpaste. Floss one time each day. Exercise for at least 30 minutes 5 or more days each week. Do not use any products that contain nicotine or tobacco. These products include cigarettes, chewing tobacco, and vaping devices, such as e-cigarettes. If you need help quitting, ask your health care provider. Do not use drugs. If you are sexually active, practice safe sex. Use a condom or other form of protection to   prevent STIs. If you do not wish to become pregnant, use a form of birth control. If you plan to become pregnant, see your health care provider for a  prepregnancy visit. Take aspirin only as told by your health care provider. Make sure that you understand how much to take and what form to take. Work with your health care provider to find out whether it is safe and beneficial for you to take aspirin daily. Find healthy ways to manage stress, such as: Meditation, yoga, or listening to music. Journaling. Talking to a trusted person. Spending time with friends and family. Minimize exposure to UV radiation to reduce your risk of skin cancer. Safety Always wear your seat belt while driving or riding in a vehicle. Do not drive: If you have been drinking alcohol. Do not ride with someone who has been drinking. When you are tired or distracted. While texting. If you have been using any mind-altering substances or drugs. Wear a helmet and other protective equipment during sports activities. If you have firearms in your house, make sure you follow all gun safety procedures. Seek help if you have been physically or sexually abused. What's next? Visit your health care provider once a year for an annual wellness visit. Ask your health care provider how often you should have your eyes and teeth checked. Stay up to date on all vaccines. This information is not intended to replace advice given to you by your health care provider. Make sure you discuss any questions you have with your health care provider. Document Revised: 08/27/2020 Document Reviewed: 08/27/2020 Elsevier Patient Education  [place].

## 2022-01-21 NOTE — Telephone Encounter (Signed)
Requested Prescriptions  Pending Prescriptions Disp Refills   albuterol (VENTOLIN HFA) 108 (90 Base) MCG/ACT inhaler 18 g 2    Sig: INHALE 2 PUFFS INTO THE LUNGS EVERY 4 HOURS AS NEEDED FOR WHEEZING OR SHORTNESS OF BREATH.     Pulmonology:  Beta Agonists 2 Failed - 01/21/2022 12:28 PM      Failed - Last BP in normal range    BP Readings from Last 1 Encounters:  01/21/22 (!) 156/100         Passed - Last Heart Rate in normal range    Pulse Readings from Last 1 Encounters:  01/21/22 83         Passed - Valid encounter within last 12 months    Recent Outpatient Visits           Today Annual physical exam   [place] Ladell Pier, MD   1 year ago No-show for appointment   [place] Ladell Pier, MD   2 years ago Need for zoster vaccination   [place], RPH-CPP   2 years ago Need for influenza vaccination   [place], RPH-CPP   2 years ago Type 2 diabetes, controlled, with peripheral neuropathy [facility])   [place], MD       Future Appointments             In 2 weeks Gildardo Pounds, NP [place]

## 2022-01-22 LAB — LIPID PANEL
Chol/HDL Ratio: 3.3 ratio (ref 0.0–4.4)
Cholesterol, Total: 183 mg/dL (ref 100–199)
HDL: 56 mg/dL (ref >39)
LDL Chol Calc (NIH): 104 mg/dL — ABNORMAL HIGH (ref 0–99)
Triglycerides: 131 mg/dL (ref 0–149)
VLDL Cholesterol Cal: 23 mg/dL (ref 5–40)

## 2022-01-22 LAB — CERVICOVAGINAL ANCILLARY ONLY
Bacterial Vaginitis (gardnerella): POSITIVE — AB
Candida Glabrata: NEGATIVE
Candida Vaginitis: NEGATIVE
Chlamydia: NEGATIVE
Comment: NEGATIVE
Comment: NEGATIVE
Comment: NEGATIVE
Comment: NEGATIVE
Comment: NEGATIVE
Comment: NORMAL
Neisseria Gonorrhea: NEGATIVE
Trichomonas: NEGATIVE

## 2022-01-22 LAB — CBC
Hematocrit: 49.9 % — ABNORMAL HIGH (ref 34.0–46.6)
Hemoglobin: 17.3 g/dL — ABNORMAL HIGH (ref 11.1–15.9)
MCH: 32.7 pg (ref 26.6–33.0)
MCHC: 34.7 g/dL (ref 31.5–35.7)
MCV: 94 fL (ref 79–97)
Platelets: 283 10*3/uL (ref 150–450)
RBC: 5.29 x10E6/uL — ABNORMAL HIGH (ref 3.77–5.28)
RDW: 11.8 % (ref 11.7–15.4)
WBC: 8.1 10*3/uL (ref 3.4–10.8)

## 2022-01-22 LAB — COMPREHENSIVE METABOLIC PANEL
ALT: 27 IU/L (ref 0–32)
AST: 24 IU/L (ref 0–40)
Albumin/Globulin Ratio: 2 (ref 1.2–2.2)
Albumin: 4.9 g/dL (ref 3.8–4.9)
Alkaline Phosphatase: 93 IU/L (ref 44–121)
BUN/Creatinine Ratio: 23 (ref 9–23)
BUN: 17 mg/dL (ref 6–24)
Bilirubin Total: 0.3 mg/dL (ref 0.0–1.2)
CO2: 23 mmol/L (ref 20–29)
Calcium: 10.2 mg/dL (ref 8.7–10.2)
Chloride: 101 mmol/L (ref 96–106)
Creatinine, Ser: 0.73 mg/dL (ref 0.57–1.00)
Globulin, Total: 2.5 g/dL (ref 1.5–4.5)
Glucose: 86 mg/dL (ref 70–99)
Potassium: 4.3 mmol/L (ref 3.5–5.2)
Sodium: 139 mmol/L (ref 134–144)
Total Protein: 7.4 g/dL (ref 6.0–8.5)
eGFR: 96 mL/min/{1.73_m2} (ref >59)

## 2022-01-22 LAB — MICROALBUMIN / CREATININE URINE RATIO
Creatinine, Urine: 42.4 mg/dL
Microalb/Creat Ratio: 17 mg/g creat (ref 0–29)
Microalbumin, Urine: 7.4 ug/mL

## 2022-01-22 LAB — HEMOGLOBIN A1C
Est. average glucose Bld gHb Est-mCnc: 126 mg/dL
Hgb A1c MFr Bld: 6 % — ABNORMAL HIGH (ref 4.8–5.6)

## 2022-01-23 ENCOUNTER — Other Ambulatory Visit: Payer: Self-pay | Admitting: Internal Medicine

## 2022-01-23 MED ORDER — METRONIDAZOLE 500 MG PO TABS: 500.0000 mg | ORAL_TABLET | Freq: Two times a day (BID) | ORAL | 0 refills | Status: DC

## 2022-01-23 NOTE — Progress Notes (Signed)
Let patient know that her red blood cell count is elevated likely due to obstructive sleep apnea.  Sleep study has been ordered as discussed on last visit. -LDL cholesterol is 104 with goal being less than 70.  Restarted on atorvastatin on her recent visit. -A1c is 6 meaning her diabetes is under good control. Kidney and liver function test normal. -Vaginal smear positive for infection known as bacterial vaginosis.  It is not sexually transmitted.  Prescription sent to her pharmacy for antibiotics called metronidazole to treat.

## 2022-01-25 ENCOUNTER — Telehealth: Payer: Self-pay

## 2022-01-25 NOTE — Telephone Encounter (Signed)
FMLA paperwork has been faxed. Attempted to contact pt but received no answer

## 2022-01-26 LAB — CYTOLOGY - PAP
Comment: NEGATIVE
Comment: NEGATIVE
Comment: NEGATIVE
Diagnosis: HIGH — AB
HPV 16: NEGATIVE
HPV 18 / 45: NEGATIVE
High risk HPV: POSITIVE — AB

## 2022-01-27 ENCOUNTER — Other Ambulatory Visit: Payer: Self-pay | Admitting: Internal Medicine

## 2022-01-27 DIAGNOSIS — R87618 Other abnormal cytological findings on specimens from cervix uteri: Secondary | ICD-10-CM

## 2022-01-27 DIAGNOSIS — R87613 High grade squamous intraepithelial lesion on cytologic smear of cervix (HGSIL): Secondary | ICD-10-CM

## 2022-02-10 ENCOUNTER — Encounter: Payer: Self-pay | Admitting: Nurse Practitioner

## 2022-02-10 ENCOUNTER — Ambulatory Visit: Payer: BC Managed Care – PPO | Attending: Nurse Practitioner | Admitting: Nurse Practitioner

## 2022-02-10 VITALS — BP 135/88 | HR 91 | Ht 66.0 in | Wt 203.0 lb

## 2022-02-10 DIAGNOSIS — M4726 Other spondylosis with radiculopathy, lumbar region: Secondary | ICD-10-CM | POA: Diagnosis not present

## 2022-02-10 NOTE — Progress Notes (Signed)
Assessment & Plan:  Tracy Fitzgerald was seen today for referral.  Diagnoses and all orders for this visit:  Osteoarthritis of spine with radiculopathy, lumbar region -     Ambulatory referral to Orthopedic Surgery Continue gabapentin as prescribed  Patient has been counseled on age-appropriate routine health concerns for screening and prevention. These are reviewed and up-to-date. Referrals have been placed accordingly. Immunizations are up-to-date or declined.    Subjective:   Chief Complaint  Patient presents with   Referral   HPI Tracy Fitzgerald 56 y.o. female presents to office today for referral to Orthopedic surgery. She has a history of low back pain with lumbar radiculopathy and pain in both legs. States pain is getting worse and she would like to be evaluated for possible surgical interventions.  At her last visit with PMR on 05-29-2015: Moderate foraminal stenosis L5-S1 could potentially cause intermittent L5 radicular symptoms. Would recommend Left L5-S1 transforaminal epidural steroid injection up to 3 and if not helpful surgical referral. As not had physical therapy for several years will make referral. Went over the results of the MRI, reviewed images, reviewed spine model.Reviewed treatment options conservative care. Patient will increase gabapentin To 300 mg 4 times per day    Today she is requesting not only a referral to orthopedic surgery but for additional work accomodation forms to be filled out. Her PCP filled out forms for her a few weeks ago however she states she was told by her employer last Tuesday that she could not continue to use the rolling chair she has been using while working as the chair was not indicated to be used in her work accomodation forms. She states she has been using this particular chair since December 2021 and the chair was taken from her and she has not returned to work since it was taken 8 days ago after she was made to use a stool instead. States she  needs to return to work and requires new paperwork to be filled out as soon as possible allowing her to use the rolling chair to sit on at work as well as being allowed to use the [employer] cart to walk around with for support as she can not use a walker due to pain it causes in her lower back and legs. She also can not tolerate the stool she was made to use at work. She can not  sit on it for more than 1-3 minutes or feels numbness and tingling in her legs which she did not have when she was using the rolling chair that was taken away. Has been taking up to 32 advil tablets a day to help with pain in addition to the gapabentin. She is aware that the advisable maximum amount of ibuprofen to be taken in a day would be 3200 mg.       Review of Systems  Constitutional:  Negative for fever, malaise/fatigue and weight loss.  HENT: Negative.  Negative for nosebleeds.   Eyes: Negative.  Negative for blurred vision, double vision and photophobia.  Respiratory: Negative.  Negative for cough and shortness of breath.   Cardiovascular: Negative.  Negative for chest pain, palpitations and leg swelling.  Gastrointestinal: Negative.  Negative for heartburn, nausea and vomiting.  Musculoskeletal:  Positive for back pain. Negative for myalgias.  Neurological:  Positive for tingling. Negative for dizziness, focal weakness, seizures and headaches.  Psychiatric/Behavioral: Negative.  Negative for suicidal ideas.     Past Medical History:  Diagnosis Date  Asthma    CHF (congestive heart failure) ([place])    Depression    Diabetes mellitus without complication (HCC)    Headache    Hypertension    Pain disorder    Sleep apnea    CPAP    Past Surgical History:  Procedure Laterality Date   ADENOIDECTOMY     COLONOSCOPY WITH PROPOFOL N/A 05/24/2017   Procedure: COLONOSCOPY WITH PROPOFOL;  Surgeon: Jerene Bears, MD;  Location: [facility];  Service: Gastroenterology;  Laterality: N/A;   TONSILLECTOMY       Family History  Problem Relation Age of Onset   Hypertension Mother    Diabetes Mother    Uterine cancer Mother    Heart disease Mother    Cancer Mother        UTERINE CANCER    Arthritis Mother    Hypertension Father    Kidney disease Father    Breast cancer Neg Hx    Colon cancer Neg Hx     Social History Reviewed with no changes to be made today.   Outpatient Medications Prior to Visit  Medication Sig Dispense Refill   albuterol (VENTOLIN HFA) 108 (90 Base) MCG/ACT inhaler INHALE 2 PUFFS INTO THE LUNGS EVERY 4 HOURS AS NEEDED FOR WHEEZING OR SHORTNESS OF BREATH. 18 g 2   ARIPiprazole (ABILIFY) 5 MG tablet Take 1 tablet (5 mg total) by mouth daily. 30 tablet 2   Aspirin-Salicylamide-Caffeine (BC HEADACHE POWDER PO) Take 1 packet by mouth 3 (three) times daily as needed (pain).     atorvastatin (LIPITOR) 10 MG tablet Take 1 tablet (10 mg total) by mouth daily. 30 tablet 5   buPROPion (WELLBUTRIN SR) 150 MG 12 hr tablet Take 1 tablet (150 mg total) by mouth 2 (two) times daily. 60 tablet 2   busPIRone (BUSPAR) 10 MG tablet Take 1 tablet (10 mg total) by mouth 3 (three) times daily. 90 tablet 2   Cholecalciferol (VITAMIN D PO) Take 1 tablet by mouth daily.      gabapentin (NEURONTIN) 300 MG capsule Take 2 capsules (600 mg total) by mouth 3 (three) times daily AND 3 capsules (900 mg total) at bedtime. 270 capsule 1   lisinopril (ZESTRIL) 5 MG tablet Take 1 tablet (5 mg total) by mouth daily. 30 tablet 5   sertraline (ZOLOFT) 100 MG tablet Take 2 tablets (200 mg total) by mouth daily. 60 tablet 2   TRUEplus Lancets 28G MISC USE AS DIRECTED 100 each 2   aspirin EC 81 MG tablet Take 1 tablet (81 mg total) by mouth daily. Swallow whole. (Patient not taking: Reported on 02/10/2022) 30 tablet 11   Blood Glucose Monitoring Suppl (TRUE METRIX METER) w/Device KIT Use as directed (Patient not taking: Reported on 02/10/2022) 1 kit 0   glucose blood test strip Use as instructed (Patient not  taking: Reported on 02/10/2022) 100 each 12   Hyoscyamine Sulfate SL (LEVSIN/SL) 0.125 MG SUBL Place 0.125 mg under the tongue every 4 (four) hours as needed. (Patient not taking: Reported on 02/10/2022) 40 each 0   melatonin 3 MG TABS tablet Take 1 tablet (3 mg total) by mouth at bedtime. (Patient not taking: Reported on 02/10/2022) 30 tablet 2   meloxicam (MOBIC) 15 MG tablet TAKE 1 TABLET BY MOUTH DAILY. (Patient not taking: Reported on 02/10/2022) 30 tablet 1   metroNIDAZOLE (FLAGYL) 500 MG tablet Take 1 tablet (500 mg total) by mouth 2 (two) times daily. (Patient not taking: Reported on 02/10/2022) 14 tablet  0   No facility-administered medications prior to visit.    Allergies  Allergen Reactions   Cymbalta [Duloxetine Hcl]     Urinary retention       Objective:    BP 135/88   Pulse 91   Ht _0  (1.676 m)   Wt 203 lb (92.1 kg)   LMP 11/23/2015   SpO2 100%   BMI 32.77 kg/m  Wt Readings from Last 3 Encounters:  02/10/22 203 lb (92.1 kg)  01/21/22 199 lb (90.3 kg)  12/31/19 230 lb 3.2 oz (104.4 kg)    Physical Exam Vitals and nursing note reviewed.  Constitutional:      Appearance: She is well-developed.  HENT:     Head: Normocephalic and atraumatic.  Cardiovascular:     Rate and Rhythm: Normal rate and regular rhythm.     Heart sounds: Normal heart sounds. No murmur heard.    No friction rub. No gallop.  Pulmonary:     Effort: Pulmonary effort is normal. No tachypnea or respiratory distress.     Breath sounds: Normal breath sounds. No decreased breath sounds, wheezing, rhonchi or rales.  Chest:     Chest wall: No tenderness.  Abdominal:     General: Bowel sounds are normal.     Palpations: Abdomen is soft.  Musculoskeletal:        General: Normal range of motion.     Cervical back: Normal range of motion.  Skin:    General: Skin is warm and dry.  Neurological:     Mental Status: She is alert and oriented to person, place, and time.     Coordination:  Coordination normal.  Psychiatric:        Behavior: Behavior normal. Behavior is cooperative.        Thought Content: Thought content normal.        Judgment: Judgment normal.          Patient has been counseled extensively about nutrition and exercise as well as the importance of adherence with medications and regular follow-up. The patient was given clear instructions to go to ER or return to medical center if symptoms don't improve, worsen or new problems develop. The patient verbalized understanding.   Follow-up: Return for needs to see Dr. Wynetta Emery as soon as possible for Disability forms.   Gildardo Pounds, FNP-BC [facility] and [facility], [place]   02/10/2022, 6:01 PM

## 2022-02-10 NOTE — Patient Instructions (Signed)
Placed in [facility] [place]  [place], [place] [address]  Ph# [phone]

## 2022-02-10 NOTE — Progress Notes (Signed)
Ortho Ref- Lower back pain: 2 ruptured disc   Chair for work reordered, needs to roll, back rest, and adjustable.

## 2022-02-11 ENCOUNTER — Encounter: Payer: Self-pay | Admitting: Internal Medicine

## 2022-02-11 ENCOUNTER — Ambulatory Visit: Payer: BC Managed Care – PPO | Attending: Internal Medicine | Admitting: Internal Medicine

## 2022-02-11 VITALS — BP 101/70 | HR 111 | Temp 98.1°F | Ht 66.0 in | Wt 203.0 lb

## 2022-02-11 DIAGNOSIS — Z029 Encounter for administrative examinations, unspecified: Secondary | ICD-10-CM | POA: Diagnosis not present

## 2022-02-11 DIAGNOSIS — M25552 Pain in left hip: Secondary | ICD-10-CM | POA: Diagnosis not present

## 2022-02-11 DIAGNOSIS — M25551 Pain in right hip: Secondary | ICD-10-CM | POA: Diagnosis not present

## 2022-02-11 DIAGNOSIS — M5416 Radiculopathy, lumbar region: Secondary | ICD-10-CM | POA: Diagnosis not present

## 2022-02-11 MED ORDER — CELECOXIB 100 MG PO CAPS
100.0000 mg | ORAL_CAPSULE | Freq: Two times a day (BID) | ORAL | 2 refills | Status: DC
  Filled 2022-05-20 – 2022-10-29 (×2): qty 60, 30d supply, fill #0

## 2022-02-11 MED ORDER — LIDOCAINE 5 % EX PTCH
1.0000 | MEDICATED_PATCH | CUTANEOUS | 1 refills | Status: DC
  Filled 2022-05-20: qty 30, 30d supply, fill #0
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
## 2022-03-12 ENCOUNTER — Telehealth: Payer: Self-pay | Admitting: Internal Medicine

## 2022-03-17 NOTE — Telephone Encounter (Signed)
Called LVM for patient to call back

## 2022-03-22 NOTE — Telephone Encounter (Signed)
Called & unable to LVM. Number on file is not in service. No other phone numbers listed.

## 2022-04-19 ENCOUNTER — Ambulatory Visit: Payer: BC Managed Care – PPO | Admitting: Internal Medicine

## 2022-04-20 ENCOUNTER — Ambulatory Visit: Payer: BC Managed Care – PPO | Admitting: Internal Medicine

## 2022-05-20 ENCOUNTER — Other Ambulatory Visit: Payer: Self-pay

## 2022-05-20 ENCOUNTER — Other Ambulatory Visit (HOSPITAL_COMMUNITY): Payer: Self-pay

## 2022-05-21 ENCOUNTER — Other Ambulatory Visit: Payer: Self-pay

## 2022-05-24 ENCOUNTER — Other Ambulatory Visit: Payer: Self-pay

## 2022-08-13 ENCOUNTER — Encounter: Payer: Self-pay | Admitting: Internal Medicine

## 2022-10-25 ENCOUNTER — Ambulatory Visit: Payer: Self-pay

## 2022-10-25 NOTE — Telephone Encounter (Signed)
Chief Complaint: tick bites Symptoms: rash to arms/legs, fatigue, headache Frequency: Multiple bites over past couple weeks Pertinent Negatives: Patient denies fever Disposition: [] ED /[x] Urgent Care (no appt availability in office) / [] Appointment(In office/virtual)/ []  [facility]/ [] Home Care/ [] Refused Recommended Disposition /[] [facility]/ []  Follow-up with PCP Additional Notes: Advised no availability in the office until 11/03/22 and will need evaluation, unable to go to the [facility] tomorrow, advised UC or ED. She says she will go tomorrow. Reports she's homeless and living in a [place] the woods, no medical insurance. Advised she will not be turned away from the ED or UC.   Reason for Disposition  [1] 2 to 14 days following tick bite AND [2] widespread rash or headache AND [3] no fever  Answer Assessment - Initial Assessment Questions 1. ATTACHED:  "Is the tick still on the skin?"  (e.g., yes, no, unsure)     No 2. ONSET - TICK STILL ATTACHED:  "How long do you think the tick has been on your skin?" (e.g., hours, days, unsure)  Note:  Is there a recent activity (camping, hiking) where the caller may have been exposed?     Not on skin now, not there long 3. ONSET - TICK NOT STILL ATTACHED: "If the tick has been removed, how long do you think the tick was attached before you removed it?" (e.g., 5 hours, 2 days). "When was this?"     No, removed 3 weeks ago 4. LOCATION: "Where is the tick bite located?" (e.g., arm, leg)     Arms, legs, side 5. TYPE of TICK: "Is it a wood tick or a deer tick?" (e.g., deer tick, wood tick; unsure)     Unsure 6. SIZE of TICK: "How big is the tick?" (e.g., size of poppy seed, apple seed, watermelon seed; unsure) Note: Deer ticks can be the size of a poppy seed (nymph) or an apple seed (adult).       Some are so tiny and some are a little larger 1/2 size of eraser 7. ENGORGED: "Did the tick look flat or engorged (full, swollen)?"  (e.g., flat, engorged; unsure)     No engorged, still flat when removed 8. OTHER SYMPTOMS: "Do you have any other symptoms?" (e.g., fever, rash, redness at bite area, red ring around bite)     Rash to arms and legs, fatigue, headache 9. PREGNANCY: "Is there any chance you are pregnant?" "When was your last menstrual period?"     No  Protocols used: Tick Bite-A-AH

## 2022-10-26 NOTE — Telephone Encounter (Signed)
Noted  

## 2022-10-28 ENCOUNTER — Emergency Department (HOSPITAL_COMMUNITY)
Admission: EM | Admit: 2022-10-28 | Discharge: 2022-10-28 | Disposition: A | Discharge status: Home / Self Care | Payer: Medicaid Other | Attending: Emergency Medicine | Admitting: Emergency Medicine

## 2022-10-28 ENCOUNTER — Other Ambulatory Visit: Payer: Self-pay

## 2022-10-28 DIAGNOSIS — R5381 Other malaise: Secondary | ICD-10-CM | POA: Insufficient documentation

## 2022-10-28 DIAGNOSIS — Z7982 Long term (current) use of aspirin: Secondary | ICD-10-CM | POA: Diagnosis not present

## 2022-10-28 DIAGNOSIS — R5383 Other fatigue: Secondary | ICD-10-CM | POA: Diagnosis not present

## 2022-10-28 DIAGNOSIS — R21 Rash and other nonspecific skin eruption: Secondary | ICD-10-CM | POA: Insufficient documentation

## 2022-10-28 LAB — CBC WITH DIFFERENTIAL/PLATELET
Abs Immature Granulocytes: 0.05 10*3/uL (ref 0.00–0.07)
Basophils Absolute: 0.1 10*3/uL (ref 0.0–0.1)
Basophils Relative: 1 %
Eosinophils Absolute: 0.3 10*3/uL (ref 0.0–0.5)
Eosinophils Relative: 3 %
HCT: 47.7 % — ABNORMAL HIGH (ref 36.0–46.0)
Hemoglobin: 15.6 g/dL — ABNORMAL HIGH (ref 12.0–15.0)
Immature Granulocytes: 1 %
Lymphocytes Relative: 23 %
Lymphs Abs: 2.2 10*3/uL (ref 0.7–4.0)
MCH: 30.8 pg (ref 26.0–34.0)
MCHC: 32.7 g/dL (ref 30.0–36.0)
MCV: 94.3 fL (ref 80.0–100.0)
Monocytes Absolute: 0.7 10*3/uL (ref 0.1–1.0)
Monocytes Relative: 7 %
Neutro Abs: 6.3 10*3/uL (ref 1.7–7.7)
Neutrophils Relative %: 65 %
Platelets: 317 10*3/uL (ref 150–400)
RBC: 5.06 MIL/uL (ref 3.87–5.11)
RDW: 13.5 % (ref 11.5–15.5)
WBC: 9.6 10*3/uL (ref 4.0–10.5)
nRBC: 0 % (ref 0.0–0.2)

## 2022-10-28 LAB — COMPREHENSIVE METABOLIC PANEL
ALT: 16 U/L (ref 0–44)
AST: 14 U/L — ABNORMAL LOW (ref 15–41)
Albumin: 3.6 g/dL (ref 3.5–5.0)
Alkaline Phosphatase: 75 U/L (ref 38–126)
Anion gap: 13 (ref 5–15)
BUN: 16 mg/dL (ref 6–20)
CO2: 23 mmol/L (ref 22–32)
Calcium: 9.5 mg/dL (ref 8.9–10.3)
Chloride: 104 mmol/L (ref 98–111)
Creatinine, Ser: 0.83 mg/dL (ref 0.44–1.00)
GFR, Estimated: >60 mL/min (ref >60)
Glucose, Bld: 109 mg/dL — ABNORMAL HIGH (ref 70–99)
Potassium: 4 mmol/L (ref 3.5–5.1)
Sodium: 140 mmol/L (ref 135–145)
Total Bilirubin: 0.8 mg/dL (ref 0.3–1.2)
Total Protein: 6.9 g/dL (ref 6.5–8.1)

## 2022-10-28 MED ORDER — DOXYCYCLINE HYCLATE 100 MG PO TABS
100.0000 mg | ORAL_TABLET | Freq: Once | ORAL | Status: AC
  Administered 2022-10-28: 100 mg via ORAL
  Filled 2022-10-28: qty 1

## 2022-10-28 MED ORDER — GABAPENTIN 300 MG PO CAPS
300.0000 mg | ORAL_CAPSULE | Freq: Three times a day (TID) | ORAL | 0 refills | Status: DC
  Filled 2022-10-28 – 2022-10-29 (×2): qty 90, 30d supply, fill #0

## 2022-10-28 MED ORDER — DOXYCYCLINE HYCLATE 100 MG PO TABS
100.0000 mg | ORAL_TABLET | Freq: Two times a day (BID) | ORAL | 0 refills | Status: DC
  Filled 2022-10-28 – 2022-10-29 (×2): qty 20, 10d supply, fill #0

## 2022-10-28 MED ORDER — GABAPENTIN 300 MG PO CAPS
300.0000 mg | ORAL_CAPSULE | Freq: Once | ORAL | Status: AC
  Administered 2022-10-28: 300 mg via ORAL
  Filled 2022-10-28: qty 1

## 2022-10-28 NOTE — Care Management (Signed)
ED RNCM met with patient at bedside rm10. Patient reports being [place] and [place] the [place]. Discussed the [facility] and [employer] in which patient is familiar with, but states she is on the waiting list for an affordable apartment soon. Patient is being discharged on an antibiotic discussed her being able to come back in the morning to pick up the medication for the [facility] pharmacy, she is agreeable and is able to come back in the am. Provided bus pass. Updated the EDP and prescription has been sent to the [facility] pharmacy. Instruction placed on  AVS.  No further ED TOC needs identified.

## 2022-10-28 NOTE — ED Triage Notes (Addendum)
Pt presents with vesicular rash x 2 weeks on extremities. Pt reports associated fatigue, malaise, nausea, and a 24 lb weight loss. Pt states she is homeless and has had multiple tick bites. Pt reports her friend was recently + for Lyme disease.   Pt also is out of her blood pressure and other medications.

## 2022-10-28 NOTE — ED Provider Notes (Signed)
[facility] Provider Note   CSN: [id number] Arrival date & time: 10/28/22  1446     History Chief Complaint  Patient presents with   Rash    Tracy Fitzgerald is a 57 y.o. female patient who presents to the emergency department today for further evaluation of general malaise, scattered vesicular rash has been ongoing for 4 weeks.  Patient states she is homeless and [place] the [place].  She states that over the last 4 weeks she has noticed at least 6 ticks on her.  She states that none of them have become engorged but she is concerned that she might have Lyme's disease.  She states that her friend was also bitten by a tick earlier and is exhibiting similar symptoms.  She did recently test positive for Lyme's disease.  She denies any drug use, alcohol use, new sexual partners.  She denies any fever or chills. She does endorse weight loss of 24lb in an unknown amount of time.    Rash      Home Medications Prior to Admission medications   Medication Sig Start Date End Date Taking? Authorizing Provider  doxycycline (VIBRAMYCIN) 100 MG capsule Take 1 capsule (100 mg total) by mouth 2 (two) times daily. 10/28/22  Yes Meredeth Ide, Cyenna Rebello M, PA-C  gabapentin (NEURONTIN) 300 MG capsule Take 1 capsule (300 mg total) by mouth 3 (three) times daily. 10/28/22  Yes Meredeth Ide, Shalunda Lindh M, PA-C  albuterol (VENTOLIN HFA) 108 (90 Base) MCG/ACT inhaler INHALE 2 PUFFS INTO THE LUNGS EVERY 4 HOURS AS NEEDED FOR WHEEZING OR SHORTNESS OF BREATH. 01/21/22   Marcine Matar, MD  ARIPiprazole (ABILIFY) 5 MG tablet Take 1 tablet (5 mg total) by mouth daily. 01/22/22   Marcine Matar, MD  aspirin EC 81 MG tablet Take 1 tablet (81 mg total) by mouth daily. Swallow whole. 12/31/19   Marcine Matar, MD  Aspirin-Salicylamide-Caffeine (BC HEADACHE POWDER PO) Take 1 packet by mouth 3 (three) times daily as needed (pain). Patient not taking: Reported on 02/11/2022    [provider]  atorvastatin (LIPITOR) 10 MG tablet Take 1 tablet (10 mg total) by mouth daily. 01/21/22   Marcine Matar, MD  Blood Glucose Monitoring Suppl (TRUE METRIX METER) w/Device KIT Use as directed Patient not taking: Reported on 02/11/2022 10/12/16   Marcine Matar, MD  buPROPion [facility] SR) 150 MG 12 hr tablet Take 1 tablet (150 mg total) by mouth 2 (two) times daily. 01/22/22   Marcine Matar, MD  busPIRone (BUSPAR) 10 MG tablet Take 1 tablet (10 mg total) by mouth 3 (three) times daily. 01/21/22   Marcine Matar, MD  celecoxib (CELEBREX) 100 MG capsule Take 1 capsule (100 mg total) by mouth 2 (two) times daily. Stop Advil 02/11/22   Marcine Matar, MD  Cholecalciferol (VITAMIN D PO) Take 1 tablet by mouth daily.     [provider]  glucose blood test strip Use as instructed Patient not taking: Reported on 02/11/2022 09/20/19   Marcine Matar, MD  Hyoscyamine Sulfate SL (LEVSIN/SL) 0.125 MG SUBL Place 0.125 mg under the tongue every 4 (four) hours as needed. Patient not taking: Reported on 02/11/2022 06/26/16   Elpidio Anis, PA-C  lidocaine (LIDODERM) 5 % Place 1 patch onto the skin daily. Apply to the lower back daily and leave on for 12 hours. 02/11/22   Marcine Matar, MD  lisinopril (ZESTRIL) 5 MG tablet Take 1 tablet (  5 mg total) by mouth daily. 01/22/22   Marcine Matar, MD  melatonin 3 MG TABS tablet Take 1 tablet (3 mg total) by mouth at bedtime. 02/18/20   Shanna Cisco, NP  metroNIDAZOLE (FLAGYL) 500 MG tablet Take 1 tablet (500 mg total) by mouth 2 (two) times daily. Patient not taking: Reported on 02/11/2022 01/23/22   Marcine Matar, MD  sertraline (ZOLOFT) 100 MG tablet Take 2 tablets (200 mg total) by mouth daily. 01/22/22   Marcine Matar, MD  TRUEplus Lancets 28G MISC USE AS DIRECTED 11/22/18   Marcine Matar, MD      Allergies    Cymbalta [duloxetine hcl]    Review of Systems   Review of Systems  Skin:   Positive for rash.  All other systems reviewed and are negative.   Physical Exam Updated Vital Signs BP (!) 155/96   Pulse 85   Temp 98.1 F (36.7 C) (Oral)   Resp 18   Ht 5\' 6"  (1.676 m)   Wt 97.5 kg   LMP 11/23/2015   SpO2 98%   BMI 34.70 kg/m  Physical Exam Vitals and nursing note reviewed.  Constitutional:      General: She is not in acute distress.    Appearance: Normal appearance.  HENT:     Head: Normocephalic and atraumatic.  Eyes:     General:        Right eye: No discharge.        Left eye: No discharge.  Cardiovascular:     Comments: Regular rate and rhythm.  S1/S2 are distinct without any evidence of murmur, rubs, or gallops.  Radial pulses are 2+ bilaterally.  Dorsalis pedis pulses are 2+ bilaterally.  No evidence of pedal edema. Pulmonary:     Comments: Clear to auscultation bilaterally.  Normal effort.  No respiratory distress.  No evidence of wheezes, rales, or rhonchi heard throughout. Abdominal:     General: Abdomen is flat. Bowel sounds are normal. There is no distension.     Tenderness: There is no abdominal tenderness. There is no guarding or rebound.  Musculoskeletal:        General: Normal range of motion.     Cervical back: Neck supple.  Skin:    General: Skin is warm and dry.     Findings: Rash present.     Comments: Scattered and scant vesicular rash at various stages of healing.  There there is no evidence of pustules.  Neurological:     General: No focal deficit present.     Mental Status: She is alert.  Psychiatric:        Mood and Affect: Mood normal.        Behavior: Behavior normal.     ED Results / Procedures / Treatments   Labs (all labs ordered are listed, but only abnormal results are displayed) Labs Reviewed  COMPREHENSIVE METABOLIC PANEL - Abnormal; Notable for the following components:      Result Value   Glucose, Bld 109 (*)    AST 14 (*)    All other components within normal limits  CBC WITH DIFFERENTIAL/PLATELET -  Abnormal; Notable for the following components:   Hemoglobin 15.6 (*)    HCT 47.7 (*)    All other components within normal limits  LYME DISEASE SEROLOGY W/REFLEX    EKG None  Radiology No results found.  Procedures Procedures    Medications Ordered in ED Medications  doxycycline (VIBRA-TABS) tablet 100 mg (has no  administration in time range)  gabapentin (NEURONTIN) capsule 300 mg (has no administration in time range)    ED Course/ Medical Decision Making/ A&P Clinical Course as of 10/28/22 1916  Thu Oct 28, 2022  1820 CBC with Differential(!) Normal.  [CF]  1820 Comprehensive metabolic panel(!) Normal.  [CF]  1820 Lyme Disease Serology w/Reflex In process.  [CF]    Clinical Course User Index [CF] Teressa Lower, PA-C   {   Click here for ABCD2, HEART and other calculatorsMedical Decision Making Messina Marren is a 57 y.o. female patient who presents to the emergency department today for further evaluation of a rash and general malaise.  Basic labs and Lyme's serology panel was ordered in triage.  Basic labs are normal.  Given the patient's risk factors and history I will prophylactically treat for Lyme disease with doxycycline here.  I will also give her a prescription as well.  Will also give her a 30-day supply of gabapentin that she has been out.  I spoke with social work who met with the patient at the bedside to make sure she can get her medications here.  Strict turn precautions were discussed.  She is here for discharge.   Amount and/or Complexity of Data Reviewed Labs: ordered. Decision-making details documented in ED Course.  Risk Prescription drug management.    Final Clinical Impression(s) / ED Diagnoses Final diagnoses:  Rash  Malaise and fatigue    Rx / DC Orders ED Discharge Orders          Ordered    doxycycline (VIBRAMYCIN) 100 MG capsule  2 times daily        10/28/22 1845    gabapentin (NEURONTIN) 300 MG capsule  3 times daily         10/28/22 1845              Teressa Lower, Cordelia Poche 10/28/22 1916    Lonell Grandchild, MD 10/28/22 2324

## 2022-10-28 NOTE — Discharge Instructions (Signed)
Please take antibiotics as prescribed. I have also refilled your gabapentin.  You can return to the emergency department for any worsening symptoms.

## 2022-10-29 ENCOUNTER — Other Ambulatory Visit (HOSPITAL_COMMUNITY): Payer: Self-pay

## 2022-10-29 ENCOUNTER — Telehealth (HOSPITAL_COMMUNITY): Payer: Self-pay

## 2022-10-29 ENCOUNTER — Other Ambulatory Visit: Payer: Self-pay

## 2022-10-29 NOTE — Telephone Encounter (Signed)
Pharmacy Patient Advocate Encounter  Received notification from [facility] that Prior Authorization for Aripiprazole has been APPROVED from 10/29/2022 to 10/29/2023. Ran test claim, Copay is $4.00. This test claim was processed through [facility]- copay amounts may vary at other pharmacies due to pharmacy/plan contracts, or as the patient moves through the different stages of their insurance plan.   PA #/Case ID/Reference #: [id number] KEY: [id number]

## 2022-11-01 LAB — LYME DISEASE SEROLOGY W/REFLEX: Lyme Total Antibody EIA: NEGATIVE

## 2022-11-03 ENCOUNTER — Ambulatory Visit: Payer: Medicaid Other | Admitting: Physician Assistant

## 2022-11-22 ENCOUNTER — Ambulatory Visit: Payer: Medicaid Other | Attending: Internal Medicine | Admitting: Internal Medicine

## 2022-11-22 ENCOUNTER — Other Ambulatory Visit: Payer: Self-pay

## 2022-11-22 ENCOUNTER — Encounter: Payer: Self-pay | Admitting: Internal Medicine

## 2022-11-22 VITALS — BP 124/82 | HR 90 | Ht 66.0 in | Wt 211.0 lb

## 2022-11-22 DIAGNOSIS — M5441 Lumbago with sciatica, right side: Secondary | ICD-10-CM

## 2022-11-22 DIAGNOSIS — F17210 Nicotine dependence, cigarettes, uncomplicated: Secondary | ICD-10-CM | POA: Diagnosis not present

## 2022-11-22 DIAGNOSIS — F331 Major depressive disorder, recurrent, moderate: Secondary | ICD-10-CM | POA: Diagnosis not present

## 2022-11-22 DIAGNOSIS — I152 Hypertension secondary to endocrine disorders: Secondary | ICD-10-CM | POA: Diagnosis not present

## 2022-11-22 DIAGNOSIS — R21 Rash and other nonspecific skin eruption: Secondary | ICD-10-CM | POA: Diagnosis not present

## 2022-11-22 DIAGNOSIS — F17200 Nicotine dependence, unspecified, uncomplicated: Secondary | ICD-10-CM

## 2022-11-22 DIAGNOSIS — G8929 Other chronic pain: Secondary | ICD-10-CM

## 2022-11-22 DIAGNOSIS — J439 Emphysema, unspecified: Secondary | ICD-10-CM

## 2022-11-22 DIAGNOSIS — E669 Obesity, unspecified: Secondary | ICD-10-CM

## 2022-11-22 DIAGNOSIS — K429 Umbilical hernia without obstruction or gangrene: Secondary | ICD-10-CM

## 2022-11-22 DIAGNOSIS — Z1231 Encounter for screening mammogram for malignant neoplasm of breast: Secondary | ICD-10-CM

## 2022-11-22 DIAGNOSIS — Z122 Encounter for screening for malignant neoplasm of respiratory organs: Secondary | ICD-10-CM

## 2022-11-22 DIAGNOSIS — R87613 High grade squamous intraepithelial lesion on cytologic smear of cervix (HGSIL): Secondary | ICD-10-CM

## 2022-11-22 DIAGNOSIS — Z23 Encounter for immunization: Secondary | ICD-10-CM

## 2022-11-22 DIAGNOSIS — E1142 Type 2 diabetes mellitus with diabetic polyneuropathy: Secondary | ICD-10-CM | POA: Diagnosis not present

## 2022-11-22 DIAGNOSIS — E1159 Type 2 diabetes mellitus with other circulatory complications: Secondary | ICD-10-CM

## 2022-11-22 DIAGNOSIS — F411 Generalized anxiety disorder: Secondary | ICD-10-CM | POA: Diagnosis not present

## 2022-11-22 LAB — POCT GLYCOSYLATED HEMOGLOBIN (HGB A1C): HbA1c, POC (controlled diabetic range): 6.2 % (ref 0.0–7.0)

## 2022-11-22 LAB — GLUCOSE, POCT (MANUAL RESULT ENTRY): POC Glucose: 108 mg/dL — AB (ref 70–99)

## 2022-11-22 MED ORDER — ACCU-CHEK GUIDE W/DEVICE KIT
PACK | 0 refills | Status: DC
Start: 2022-11-22 — End: 2023-12-06
  Filled 2022-11-22: qty 1, 30d supply, fill #0

## 2022-11-22 MED ORDER — BUPROPION HCL ER (SR) 150 MG PO TB12
150.0000 mg | ORAL_TABLET | Freq: Two times a day (BID) | ORAL | 2 refills | Status: DC
Start: 2022-11-22 — End: 2023-11-09
  Filled 2022-11-22 – 2023-03-18 (×4): qty 60, 30d supply, fill #0

## 2022-11-22 MED ORDER — BUSPIRONE HCL 10 MG PO TABS
10.0000 mg | ORAL_TABLET | Freq: Three times a day (TID) | ORAL | 2 refills | Status: DC
Start: 2022-11-22 — End: 2023-08-09
  Filled 2022-11-22 – 2023-02-17 (×3): qty 90, 30d supply, fill #0
  Filled 2023-03-18: qty 90, 30d supply, fill #1

## 2022-11-22 MED ORDER — ACCU-CHEK SOFTCLIX LANCETS MISC
2 refills | Status: DC
Start: 2022-11-22 — End: 2022-11-22
  Filled 2022-11-22: qty 100, 34d supply, fill #0

## 2022-11-22 MED ORDER — GLUCOSE BLOOD VI STRP
ORAL_STRIP | 12 refills | Status: DC
Start: 2022-11-22 — End: 2022-11-22
  Filled 2022-11-22: qty 100, 33d supply, fill #0

## 2022-11-22 MED ORDER — ACCU-CHEK SOFTCLIX LANCETS MISC
12 refills | Status: AC
Start: 2022-11-22 — End: ?
  Filled 2022-11-22: qty 100, 34d supply, fill #0

## 2022-11-22 MED ORDER — ALBUTEROL SULFATE HFA 108 (90 BASE) MCG/ACT IN AERS
2.0000 | INHALATION_SPRAY | RESPIRATORY_TRACT | 2 refills | Status: DC | PRN
Start: 2022-11-22 — End: 2023-05-27
  Filled 2022-11-22: qty 18, 17d supply, fill #0
  Filled 2023-01-11: qty 18, 25d supply, fill #0
  Filled 2023-02-17 – 2023-03-18 (×2): qty 18, 25d supply, fill #1
  Filled 2023-04-20: qty 18, 25d supply, fill #2

## 2022-11-22 MED ORDER — ACCU-CHEK GUIDE VI STRP
ORAL_STRIP | 12 refills | Status: AC
Start: 2022-11-22 — End: ?
  Filled 2022-11-22: qty 50, 34d supply, fill #0

## 2022-11-22 MED ORDER — SERTRALINE HCL 100 MG PO TABS
200.0000 mg | ORAL_TABLET | Freq: Every day | ORAL | 5 refills | Status: DC
Start: 2022-11-22 — End: 2023-12-06
  Filled 2022-11-22 – 2023-03-18 (×4): qty 60, 30d supply, fill #0

## 2022-11-22 MED ORDER — ARIPIPRAZOLE 5 MG PO TABS
5.0000 mg | ORAL_TABLET | Freq: Every day | ORAL | 4 refills | Status: DC
Start: 2022-11-22 — End: 2023-12-06
  Filled 2022-11-22 – 2023-03-18 (×3): qty 30, 30d supply, fill #0

## 2022-11-22 MED ORDER — GABAPENTIN 300 MG PO CAPS
ORAL_CAPSULE | ORAL | 5 refills | Status: DC
Start: 2022-11-22 — End: 2023-06-08
  Filled 2022-11-22: qty 270, fill #0
  Filled 2022-11-22: qty 270, 30d supply, fill #0
  Filled 2023-01-10: qty 270, 30d supply, fill #1
  Filled 2023-02-17: qty 270, 30d supply, fill #2
  Filled 2023-03-18: qty 270, 30d supply, fill #3
  Filled 2023-04-20: qty 270, 30d supply, fill #4
  Filled 2023-05-27 (×2): qty 270, 30d supply, fill #5

## 2022-11-22 MED ORDER — LIDOCAINE 5 % EX PTCH
1.0000 | MEDICATED_PATCH | CUTANEOUS | 1 refills | Status: DC
Start: 2022-11-22 — End: 2023-12-21
  Filled 2022-11-22: qty 30, 30d supply, fill #0

## 2022-11-22 MED ORDER — CELECOXIB 100 MG PO CAPS
100.0000 mg | ORAL_CAPSULE | Freq: Two times a day (BID) | ORAL | 2 refills | Status: DC
Start: 2022-11-22 — End: 2023-06-08
  Filled 2022-11-22 – 2022-11-26 (×2): qty 180, 90d supply, fill #0
  Filled 2023-02-17 – 2023-03-18 (×2): qty 60, 30d supply, fill #0

## 2022-11-22 MED ORDER — SULFAMETHOXAZOLE-TRIMETHOPRIM 800-160 MG PO TABS
1.0000 | ORAL_TABLET | Freq: Two times a day (BID) | ORAL | 0 refills | Status: DC
Start: 2022-11-22 — End: 2023-06-08
  Filled 2022-11-22: qty 14, 7d supply, fill #0
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
## 2022-11-23 ENCOUNTER — Other Ambulatory Visit: Payer: Self-pay

## 2022-11-23 LAB — TREPONEMAL ANTIBODIES, TPPA: Treponemal Antibodies, TPPA: NONREACTIVE

## 2022-11-23 LAB — RPR W/REFLEX TO TREPSURE: RPR: NONREACTIVE

## 2022-11-24 LAB — LIPID PANEL
Chol/HDL Ratio: 3.1 ratio (ref 0.0–4.4)
Cholesterol, Total: 192 mg/dL (ref 100–199)
HDL: 61 mg/dL (ref >39)
LDL Chol Calc (NIH): 81 mg/dL (ref 0–99)
Triglycerides: 308 mg/dL — ABNORMAL HIGH (ref 0–149)
VLDL Cholesterol Cal: 50 mg/dL — ABNORMAL HIGH (ref 5–40)

## 2022-11-24 LAB — HIV ANTIBODY (ROUTINE TESTING W REFLEX): HIV Screen 4th Generation wRfx: NONREACTIVE

## 2022-11-26 ENCOUNTER — Telehealth: Payer: Self-pay

## 2022-11-26 ENCOUNTER — Other Ambulatory Visit: Payer: Self-pay

## 2022-11-26 NOTE — Telephone Encounter (Signed)
Pharmacy Patient Advocate Encounter  Received notification from [facility] that Prior Authorization for LIDOCAINE PATCHES has been APPROVED from 11/23/2022 to 11/23/2023   PA #/Case ID/Reference #: [id number]

## 2022-11-30 ENCOUNTER — Other Ambulatory Visit: Payer: Self-pay

## 2022-12-02 ENCOUNTER — Other Ambulatory Visit: Payer: Self-pay

## 2022-12-03 ENCOUNTER — Other Ambulatory Visit: Payer: Self-pay

## 2022-12-06 ENCOUNTER — Other Ambulatory Visit: Payer: Self-pay

## 2022-12-08 ENCOUNTER — Other Ambulatory Visit (HOSPITAL_BASED_OUTPATIENT_CLINIC_OR_DEPARTMENT_OTHER): Payer: Self-pay

## 2023-01-10 ENCOUNTER — Other Ambulatory Visit: Payer: Self-pay

## 2023-01-11 ENCOUNTER — Other Ambulatory Visit: Payer: Self-pay

## 2023-02-17 ENCOUNTER — Other Ambulatory Visit: Payer: Self-pay

## 2023-03-01 ENCOUNTER — Other Ambulatory Visit: Payer: Self-pay

## 2023-03-18 ENCOUNTER — Other Ambulatory Visit: Payer: Self-pay

## 2023-03-22 ENCOUNTER — Other Ambulatory Visit: Payer: Self-pay

## 2023-03-28 ENCOUNTER — Ambulatory Visit: Payer: Medicaid Other | Admitting: Internal Medicine

## 2023-04-22 ENCOUNTER — Other Ambulatory Visit: Payer: Self-pay

## 2023-05-27 ENCOUNTER — Other Ambulatory Visit: Payer: Self-pay

## 2023-05-27 ENCOUNTER — Other Ambulatory Visit: Payer: Self-pay | Admitting: Internal Medicine

## 2023-05-27 DIAGNOSIS — J439 Emphysema, unspecified: Secondary | ICD-10-CM

## 2023-05-27 MED ORDER — ALBUTEROL SULFATE HFA 108 (90 BASE) MCG/ACT IN AERS
2.0000 | INHALATION_SPRAY | RESPIRATORY_TRACT | 2 refills | Status: DC | PRN
  Filled 2023-05-27: qty 18, 17d supply, fill #0

## 2023-05-30 ENCOUNTER — Other Ambulatory Visit: Payer: Self-pay

## 2023-06-08 ENCOUNTER — Encounter: Payer: Self-pay | Admitting: Physician Assistant

## 2023-06-08 ENCOUNTER — Other Ambulatory Visit: Payer: Self-pay

## 2023-06-08 ENCOUNTER — Ambulatory Visit: Attending: Physician Assistant | Admitting: Physician Assistant

## 2023-06-08 VITALS — BP 145/82 | HR 99 | Temp 98.8°F | Resp 20 | Ht 65.0 in | Wt 279.0 lb

## 2023-06-08 DIAGNOSIS — B977 Papillomavirus as the cause of diseases classified elsewhere: Secondary | ICD-10-CM

## 2023-06-08 DIAGNOSIS — G8929 Other chronic pain: Secondary | ICD-10-CM | POA: Diagnosis not present

## 2023-06-08 DIAGNOSIS — G4733 Obstructive sleep apnea (adult) (pediatric): Secondary | ICD-10-CM | POA: Diagnosis not present

## 2023-06-08 DIAGNOSIS — Z1331 Encounter for screening for depression: Secondary | ICD-10-CM

## 2023-06-08 DIAGNOSIS — F418 Other specified anxiety disorders: Secondary | ICD-10-CM | POA: Diagnosis not present

## 2023-06-08 DIAGNOSIS — E1142 Type 2 diabetes mellitus with diabetic polyneuropathy: Secondary | ICD-10-CM | POA: Diagnosis not present

## 2023-06-08 DIAGNOSIS — R609 Edema, unspecified: Secondary | ICD-10-CM | POA: Diagnosis not present

## 2023-06-08 DIAGNOSIS — Z7984 Long term (current) use of oral hypoglycemic drugs: Secondary | ICD-10-CM | POA: Diagnosis not present

## 2023-06-08 DIAGNOSIS — Z5900 Homelessness unspecified: Secondary | ICD-10-CM

## 2023-06-08 DIAGNOSIS — M5441 Lumbago with sciatica, right side: Secondary | ICD-10-CM | POA: Diagnosis not present

## 2023-06-08 DIAGNOSIS — Z91199 Patient's noncompliance with other medical treatment and regimen due to unspecified reason: Secondary | ICD-10-CM

## 2023-06-08 DIAGNOSIS — R87613 High grade squamous intraepithelial lesion on cytologic smear of cervix (HGSIL): Secondary | ICD-10-CM | POA: Diagnosis not present

## 2023-06-08 DIAGNOSIS — M4726 Other spondylosis with radiculopathy, lumbar region: Secondary | ICD-10-CM | POA: Diagnosis not present

## 2023-06-08 DIAGNOSIS — F419 Anxiety disorder, unspecified: Secondary | ICD-10-CM

## 2023-06-08 DIAGNOSIS — I10 Essential (primary) hypertension: Secondary | ICD-10-CM

## 2023-06-08 DIAGNOSIS — Z139 Encounter for screening, unspecified: Secondary | ICD-10-CM

## 2023-06-08 LAB — GLUCOSE, POCT (MANUAL RESULT ENTRY): POC Glucose: 152 mg/dL — AB (ref 70–99)

## 2023-06-08 LAB — POCT GLYCOSYLATED HEMOGLOBIN (HGB A1C): HbA1c, POC (controlled diabetic range): 7.6 % — AB (ref 0.0–7.0)

## 2023-06-08 MED ORDER — LISINOPRIL 5 MG PO TABS
5.0000 mg | ORAL_TABLET | Freq: Every day | ORAL | 5 refills | Status: DC
Start: 2023-06-08 — End: 2023-12-06
  Filled 2023-06-08: qty 30, 30d supply, fill #0

## 2023-06-08 MED ORDER — GABAPENTIN 300 MG PO CAPS
ORAL_CAPSULE | ORAL | 5 refills | Status: DC
  Filled 2023-06-08: qty 270, 30d supply, fill #0

## 2023-06-08 MED ORDER — METFORMIN HCL 1000 MG PO TABS
1000.0000 mg | ORAL_TABLET | Freq: Two times a day (BID) | ORAL | 3 refills | Status: DC
Start: 2023-06-08 — End: 2023-08-02
  Filled 2023-06-08: qty 180, 90d supply, fill #0

## 2023-06-08 MED ORDER — FUROSEMIDE 20 MG PO TABS
ORAL_TABLET | ORAL | 3 refills | Status: DC
Start: 2023-06-08 — End: 2023-10-24
  Filled 2023-06-08: qty 30, 30d supply, fill #0

## 2023-06-08 MED ORDER — MELOXICAM 15 MG PO TABS
15.0000 mg | ORAL_TABLET | Freq: Every day | ORAL | 3 refills | Status: AC
Start: 2023-06-08 — End: ?
  Filled 2023-06-08: qty 30, 30d supply, fill #0

## 2023-06-08 NOTE — Progress Notes (Signed)
 Patient ID: Tracy Fitzgerald, female   DOB: 1966-02-21, 58 y.o.   MRN: [id number]   Tracy Fitzgerald, is a 58 y.o. female  BJY:[id number]  [id number]:[id number]  DOB - June 01, 1965  No chief complaint on file.      Subjective:   Tracy Fitzgerald is a 58 y.o. female here today for a follow up visit.  She has had poor compliance and lack of follow up partially due to depression.  She is not taking zoloft bc it causes anorgasmia.  She is taking buspar, abilify and wellbutrin.    She and her partner live in a tent.  They are currently in the process of trying to buy a generator.  She says she is taking her meds except lisinopril and zoloft.  It appears there were RF on lisinopril but she said there weren't so I am resending that today.    She did not follow up with gyn or psych after last visit.  Denies plan or intent and #9 on PHQ9=1.  She says she has passive ideas of not wanting to wake up bc of living situation and having inadequate funds.  She says she will follow up with [facility] this time.    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  Diet has been poor and eating a lot of starches and carbs.  Also c/o SOB and edema.  She denies CP/palpitations or dizziness.  Lasix has helped in the past  No problems updated.  ALLERGIES: Allergies  Allergen Reactions   Cymbalta [Duloxetine Hcl]     Urinary retention    PAST MEDICAL HISTORY: Past Medical History:  Diagnosis Date   Asthma    CHF (congestive heart failure) (HCC)    Depression    Diabetes mellitus without complication (HCC)    Headache    Hypertension    Pain disorder    Sleep apnea    CPAP    MEDICATIONS AT HOME: Prior to Admission medications   Medication Sig Start Date End Date Taking? Authorizing Provider  Accu-Chek Softclix Lancets lancets Test blood sugar once a day 11/22/22  Yes Marcine Matar, MD  albuterol (VENTOLIN HFA) 108 (90 Base) MCG/ACT inhaler INHALE 2 PUFFS INTO THE LUNGS EVERY 4  HOURS AS NEEDED FOR WHEEZING OR SHORTNESS OF BREATH. 05/27/23  Yes Marcine Matar, MD  ARIPiprazole (ABILIFY) 5 MG tablet Take 1 tablet (5 mg total) by mouth daily. 11/22/22  Yes Marcine Matar, MD  aspirin EC 81 MG tablet Take 1 tablet (81 mg total) by mouth daily. Swallow whole. 12/31/19  Yes Marcine Matar, MD  atorvastatin (LIPITOR) 10 MG tablet Take 1 tablet (10 mg total) by mouth daily. 01/21/22  Yes Marcine Matar, MD  Blood Glucose Monitoring Suppl (ACCU-CHEK GUIDE) w/Device KIT Test blood sugar once a day 11/22/22  Yes Marcine Matar, MD  Blood Glucose Monitoring Suppl (TRUE METRIX METER) w/Device KIT Use as directed 10/12/16  Yes Marcine Matar, MD  buPROPion [facility] SR) 150 MG 12 hr tablet Take 1 tablet (150 mg total) by mouth 2 (two) times daily. 11/22/22  Yes Marcine Matar, MD  busPIRone (BUSPAR) 10 MG tablet Take 1 tablet (10 mg total) by mouth 3 (three) times daily. 11/22/22  Yes Marcine Matar, MD  Cholecalciferol (VITAMIN D PO) Take 1 tablet by mouth daily.    Yes [provider]  furosemide (LASIX) 20 MG tablet 1 tab daily X 5  days then prn edema 06/08/23  Yes Anders Simmonds, PA-C  glucose blood (ACCU-CHEK GUIDE) test strip Test blood sugar once a day 11/22/22  Yes Marcine Matar, MD  lidocaine (LIDODERM) 5 % Place 1 patch onto the skin daily. Apply to the lower back daily and leave on for 12 hours. 11/22/22  Yes Marcine Matar, MD  melatonin 3 MG TABS tablet Take 1 tablet (3 mg total) by mouth at bedtime. 02/18/20  Yes Toy Cookey E, NP  meloxicam (MOBIC) 15 MG tablet Take 1 tablet (15 mg total) by mouth daily. Prn pain 06/08/23  Yes Anders Simmonds, PA-C  metFORMIN (GLUCOPHAGE) 1000 MG tablet Take 1 tablet (1,000 mg total) by mouth 2 (two) times daily with a meal. 06/08/23  Yes Shaheim Mahar M, PA-C  sertraline (ZOLOFT) 100 MG tablet Take 2 tablets (200 mg total) by mouth daily. 11/22/22  Yes Marcine Matar, MD  gabapentin  (NEURONTIN) 300 MG capsule Take 2 capsules (600 mg total) by mouth 3 (three) times daily AND 3 capsules (900 mg total) at bedtime. 06/08/23   Anders Simmonds, PA-C  lisinopril (ZESTRIL) 5 MG tablet Take 1 tablet (5 mg total) by mouth daily. 06/08/23   Yoshiharu Brassell, Marzella Schlein, PA-C    ROS: Neg HEENT Neg resp Neg cardiac Neg GI Neg GU Neg MS Neg psych Neg neuro  Objective:   Vitals:   06/08/23 1535  BP: (!) 145/82  Pulse: 99  Resp: 20  Temp: 98.8 F (37.1 C)  TempSrc: Oral  SpO2: 97%  Weight: 279 lb (126.6 kg)  Height: 5\' 5"  (1.651 m)   Exam General appearance : Awake, alert, not in any distress. Speech Clear but pressured and interrupts at times. Not toxic looking HEENT: Atraumatic and Normocephalic, pupils equally reactive to light and accomodation Neck: Supple, no JVD. No cervical lymphadenopathy.  Chest: fair air entry bilaterally, CTAB.  No rales/rhonchi/wheezing CVS: S1 S2 regular, no murmurs.  Extremities: B/L Lower Ext shows 1+ edema B, both legs are warm to touch Neurology: Awake alert, and oriented X 3, CN II-XII intact, Non focal Skin: No Rash  Data Review Lab Results  Component Value Date   HGBA1C 7.6 (A) 06/08/2023   HGBA1C 6.2 11/22/2022   HGBA1C 6.0 (H) 01/21/2022    Assessment & Plan   1. Type 2 diabetes, controlled, with peripheral neuropathy (HCC) (Primary) Titrate to 1000mg  metformin bid to ease stomach SE.   - Glucose (CBG) - HgB A1c - metFORMIN (GLUCOPHAGE) 1000 MG tablet; Take 1 tablet (1,000 mg total) by mouth 2 (two) times daily with a meal.  Dispense: 180 tablet; Refill: 3 work at a goal of eliminating sugary drinks, candy, desserts, sweets, refined sugars, processed foods, and white carbohydrates.    2. Chronic bilateral low back pain with right-sided sciatica - gabapentin (NEURONTIN) 300 MG capsule; Take 2 capsules (600 mg total) by mouth 3 (three) times daily AND 3 capsules (900 mg total) at bedtime.  Dispense: 270 capsule; Refill: 5  3.  Essential hypertension Not at goal but not taking meds - lisinopril (ZESTRIL) 5 MG tablet; Take 1 tablet (5 mg total) by mouth daily.  Dispense: 30 tablet; Refill: 5  4. Homelessness - Ambulatory referral to Psychology - AMB Referral [facility] Care Management  5. Osteoarthritis of spine with radiculopathy, lumbar region Wants meloxicam instead of celebrex - meloxicam (MOBIC) 15 MG tablet; Take 1 tablet (15 mg total) by mouth daily. Prn pain  Dispense: 30 tablet; Refill: 3  6. OSA (obstructive sleep apnea) Sleep study ordered  7. Anxiety and depression Needs to p/up RF meds sent by Dr Laural Benes - Ambulatory referral to Psychology - AMB Referral [facility]BCI Care Management  8. Positive depression screening Continue buspar, wellbutrin, and abilify(has RF and f/up with psych) - Ambulatory referral to Psychology - AMB Referral [facility]BCI Care Management  9. 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 High risk HPV infection - Ambulatory referral to Gynecology  11. Edema, unspecified type - furosemide (LASIX) 20 MG tablet; 1 tab daily X 5 days then prn edema  Dispense: 30 tablet; Refill: 3    Return in about 2 months (around 08/08/2023) for PCP for chronic conditions-Dr Laural Benes.  The patient was given clear instructions to go to ER or return to medical center if symptoms don't improve, worsen or new problems develop. The patient verbalized understanding. The patient was told to call to get lab results if they haven't heard anything in the next week.      Georgian Co, PA-C [facility] [place], [place] [phone]   06/08/2023, 4:14 PM

## 2023-06-08 NOTE — Patient Instructions (Signed)
 Work at a goal of eliminating sugary drinks, candy, desserts, sweets, refined sugars, processed foods, and white carbohydrates.    Drink 64 ounces water daily.  If you develop SOB/CP-call [phone]

## 2023-06-08 NOTE — Progress Notes (Signed)
 Refill Lisinopril, out x 2 mos Needs referrals from last OV placed again. She was unable to follow up : surgeon, psych, gyn, opthalmology  Feels fluid accumulation in legs and chest. Especially feels worse after eating salt.   Would prefer meloxicam instead of celebrex  CBG- 152

## 2023-06-09 ENCOUNTER — Telehealth: Payer: Self-pay | Admitting: *Deleted

## 2023-06-09 NOTE — Progress Notes (Signed)
 Complex Care Management Note Care Guide Note  06/09/2023 Name: Tracy Fitzgerald MRN: [id number] DOB: May 07, 1965   Complex Care Management Outreach Attempts: An unsuccessful telephone outreach was attempted today to offer the patient information about available complex care management services.  Follow Up Plan:  Additional outreach attempts will be made to offer the patient complex care management information and services.   Encounter Outcome:  No Answer  Gwenevere Ghazi  [facility]  [facility], [facility] Guide  Direct Dial: [phone]  Fax [phone]

## 2023-06-13 NOTE — Progress Notes (Unsigned)
 Complex Care Management Note Care Guide Note  06/13/2023 Name: Tracy Fitzgerald MRN: [id number] DOB: 31-May-1965   Complex Care Management Outreach Attempts: A second unsuccessful outreach was attempted today to offer the patient with information about available complex care management services.  Follow Up Plan:  Additional outreach attempts will be made to offer the patient complex care management information and services.   Encounter Outcome:  No Answer  Gwenevere Ghazi  [facility]  [facility], [facility] Guide  Direct Dial: [phone]  Fax [phone]

## 2023-06-16 NOTE — Progress Notes (Signed)
 Complex Care Management Note Care Guide Note  06/16/2023 Name: Tracy Fitzgerald MRN: [id number] DOB: [birth year]   Complex Care Management Outreach Attempts: A third unsuccessful outreach was attempted today to offer the patient with information about available complex care management services.  Follow Up Plan:  No further outreach attempts will be made at this time. We have been unable to contact the patient to offer or enroll patient in complex care management services.  Encounter Outcome:  No Answer  Gwenevere Ghazi  [facility]  [facility], [facility] Guide  Direct Dial: [phone]  Fax [phone]

## 2023-07-11 ENCOUNTER — Other Ambulatory Visit: Payer: Self-pay

## 2023-08-02 ENCOUNTER — Encounter: Payer: Self-pay | Admitting: Internal Medicine

## 2023-08-02 ENCOUNTER — Ambulatory Visit: Attending: Internal Medicine | Admitting: Internal Medicine

## 2023-08-02 VITALS — BP 122/89 | HR 103 | Temp 98.2°F | Ht 65.0 in | Wt 272.0 lb

## 2023-08-02 DIAGNOSIS — N3946 Mixed incontinence: Secondary | ICD-10-CM | POA: Diagnosis not present

## 2023-08-02 DIAGNOSIS — Z7984 Long term (current) use of oral hypoglycemic drugs: Secondary | ICD-10-CM

## 2023-08-02 DIAGNOSIS — Z1231 Encounter for screening mammogram for malignant neoplasm of breast: Secondary | ICD-10-CM

## 2023-08-02 DIAGNOSIS — K429 Umbilical hernia without obstruction or gangrene: Secondary | ICD-10-CM

## 2023-08-02 DIAGNOSIS — I10 Essential (primary) hypertension: Secondary | ICD-10-CM | POA: Diagnosis not present

## 2023-08-02 DIAGNOSIS — E1159 Type 2 diabetes mellitus with other circulatory complications: Secondary | ICD-10-CM

## 2023-08-02 DIAGNOSIS — F411 Generalized anxiety disorder: Secondary | ICD-10-CM

## 2023-08-02 DIAGNOSIS — E1142 Type 2 diabetes mellitus with diabetic polyneuropathy: Secondary | ICD-10-CM

## 2023-08-02 DIAGNOSIS — R87613 High grade squamous intraepithelial lesion on cytologic smear of cervix (HGSIL): Secondary | ICD-10-CM | POA: Diagnosis not present

## 2023-08-02 DIAGNOSIS — Z139 Encounter for screening, unspecified: Secondary | ICD-10-CM

## 2023-08-02 DIAGNOSIS — F331 Major depressive disorder, recurrent, moderate: Secondary | ICD-10-CM

## 2023-08-02 MED ORDER — METFORMIN HCL ER 500 MG PO TB24: 500.0000 mg | ORAL_TABLET | Freq: Two times a day (BID) | ORAL | 2 refills | Status: DC

## 2023-08-02 MED ORDER — ATORVASTATIN CALCIUM 10 MG PO TABS
10.0000 mg | ORAL_TABLET | Freq: Every day | ORAL | 1 refills | Status: DC
Start: 2023-08-02 — End: 2023-12-06

## 2023-08-02 NOTE — Patient Instructions (Addendum)
 Gynecology - [facility]  [address]  [place], [place] [address] [phone]  VISIT SUMMARY:  Today, we reviewed your diabetes and hypertension management, addressed your neuropathy symptoms, and discussed your mental health and other ongoing health concerns. We made some changes to your medications and resubmitted several referrals to ensure you receive the necessary follow-up care.  YOUR PLAN:  -TYPE 2 DIABETES MELLITUS: Type 2 diabetes is a condition where your body does not use insulin properly, leading to high blood sugar levels. We are changing your metformin  to a long-acting form, 500 mg twice a day, to help manage your blood sugar levels better. We will also check your urine for protein and refer you to an ophthalmologist for a diabetic eye exam.  -DIABETIC NEUROPATHY: Diabetic neuropathy is nerve damage caused by high blood sugar levels, leading to pain and numbness. Continue taking gabapentin  to manage your symptoms and try to maintain good blood sugar control to prevent worsening of your symptoms.  -HYPERTENSION: Hypertension, or high blood pressure, can lead to serious health problems if not managed properly. Continue taking lisinopril  5 mg daily and try to limit your salt intake to help control your blood pressure and reduce foot pain.  -ABNORMAL PAP SMEAR WITH HPV: An abnormal Pap smear with high-risk HPV indicates changes in the cells of your cervix that could potentially lead to cancer. We are resubmitting a referral to gynecology for further evaluation.  -UMBILICAL HERNIA: An umbilical hernia is a bulge around your belly button that can worsen with heavy lifting. We are resubmitting a referral to a general surgeon to evaluate your hernia and discuss potential Medicaid coverage for surgery.  -URINARY INCONTINENCE: Urinary incontinence is the loss of bladder control, leading to accidental urine leakage. We are submitting a referral to [employer] for  incontinence supplies to help manage this issue.  -DEPRESSION AND ANXIETY: Depression and anxiety are mental health conditions that can affect your mood and daily activities. We are resubmitting a referral to behavioral health to adjust your medications and reconnect you with therapy services.  INSTRUCTIONS:  Please follow up with the referrals we have resubmitted for your diabetic eye exam, gynecology evaluation, general surgeon consultation, and behavioral health services. Additionally, we will check your urine for protein to monitor your kidney health. Continue taking your medications as prescribed and try to maintain a healthy diet and lifestyle.
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
## 2023-08-03 ENCOUNTER — Telehealth: Payer: Self-pay | Admitting: *Deleted

## 2023-08-03 NOTE — Progress Notes (Signed)
 Complex Care Management Note  Care Guide Note 08/03/2023 Name: Tracy Fitzgerald MRN: [id number] DOB: 02/05/1966  Tracy Fitzgerald is a 58 y.o. year old female who sees Lawrance Presume, MD for primary care. I reached out to Reeves Canter by phone today to offer complex care management services.  Ms. Swaminathan was given information about Complex Care Management services today including:   The Complex Care Management services include support from the care team which includes your Nurse Care Manager, Clinical Social Worker, or Pharmacist.  The Complex Care Management team is here to help remove barriers to the health concerns and goals most important to you. Complex Care Management services are voluntary, and the patient may decline or stop services at any time by request to their care team member.   Complex Care Management Consent Status: Patient agreed to services and verbal consent obtained.   Follow up plan:  Telephone appointment with complex care management team member scheduled for:  08/09/23  Encounter Outcome:  Patient Scheduled  Barnie Bora  [facility]  [facility], [facility] Guide  Direct Dial: [phone]  Fax [phone]

## 2023-08-04 ENCOUNTER — Ambulatory Visit: Payer: Self-pay | Admitting: Internal Medicine

## 2023-08-04 LAB — MICROALBUMIN / CREATININE URINE RATIO
Creatinine, Urine: 113.2 mg/dL
Microalb/Creat Ratio: 18 mg/g{creat} (ref 0–29)
Microalbumin, Urine: 20.1 ug/mL

## 2023-08-07 ENCOUNTER — Other Ambulatory Visit: Payer: Self-pay | Admitting: Internal Medicine

## 2023-08-07 DIAGNOSIS — F411 Generalized anxiety disorder: Secondary | ICD-10-CM

## 2023-08-09 ENCOUNTER — Other Ambulatory Visit: Payer: Self-pay

## 2023-08-09 ENCOUNTER — Telehealth: Payer: Self-pay | Admitting: Internal Medicine

## 2023-08-09 NOTE — Telephone Encounter (Signed)
 FYI Copied from CRM [id number]. Topic: General - Other >> Aug 09, 2023  9:04 AM [employer] wrote:  Reason for CRM: Tiara from the Sleep disorder called to let Dr. Lincoln Renshaw know that patient's referral was denied. Patient's denial letter will also be faxed to clinic.  Callback #: [id number]

## 2023-08-09 NOTE — Patient Instructions (Signed)
 Visit Information  Thank you for taking time to visit with me today. Please don't hesitate to contact me if I can be of assistance to you before our next scheduled appointment.  Our next appointment is by telephone on 08/30/2023 at 3pm Please call the care guide team at [phone] if you need to cancel or reschedule your appointment.     Please call the Suicide and Crisis Lifeline: 988 call the [place]  National Suicide Prevention Lifeline: 212 770 8743 or TTY: 567 741 2480 TTY 416-364-9675) to talk to a trained counselor call 1-800-273-TALK (toll free, 24 hour hotline) go to [facility] [address], [place] [phone]) call 911 if you are experiencing a Mental Health or Behavioral Health Crisis or need someone to talk to.  Patient verbalizes understanding of instructions and care plan provided today and agrees to view in MyChart. Active MyChart status and patient understanding of how to access instructions and care plan via MyChart confirmed with patient.     Haven Lion, BSW [place]  [facility] Social Worker, [facility] [phone]

## 2023-08-09 NOTE — Patient Outreach (Signed)
 Complex Care Management   Visit Note  08/09/2023  Name:  Tracy Fitzgerald MRN: [id number] DOB: 1965/05/25  Situation: Referral received for Complex Care Management related to SDOH Barriers:  Transportation Housing . Food insecurity Physicist, medical Strain I obtained verbal consent from Patient.  Visit completed with patient  on the phone   SDOH Interventions Today    Flowsheet Row Most Recent Value  SDOH Interventions   Food Insecurity Interventions Community Resources Provided  Housing Interventions Community Resources Provided  Transportation Interventions Community Resources Provided  Financial Strain Interventions Community Resources Provided         Assessment: BSW outreached patient to assist with SDOH barriers. BSW identified challenges with housing, food insecurity, transportation and financial strain. BSW sent patient a list of resources and will be following up.  Recommendation:   Referral to: RNCM on 09/13/2023 at 3:00 pm  Follow Up Plan:   Telephone follow-up on 08/30/2023 at 3:00 pm  Haven Lion, BSW [place]  [facility] Social Worker, [facility] [phone]

## 2023-08-09 NOTE — Addendum Note (Signed)
 Addended by: Concetta Dee B on: 08/09/2023 08:42 PM   Modules accepted: Orders

## 2023-08-11 ENCOUNTER — Ambulatory Visit
Admission: RE | Admit: 2023-08-11 | Discharge: 2023-08-11 | Disposition: A | Discharge status: Home / Self Care | Source: Ambulatory Visit | Attending: Internal Medicine | Admitting: Internal Medicine

## 2023-08-11 DIAGNOSIS — Z1231 Encounter for screening mammogram for malignant neoplasm of breast: Secondary | ICD-10-CM | POA: Diagnosis not present

## 2023-08-23 ENCOUNTER — Other Ambulatory Visit: Payer: Self-pay | Admitting: Medical Genetics

## 2023-08-30 ENCOUNTER — Other Ambulatory Visit: Payer: Self-pay

## 2023-08-30 DIAGNOSIS — E1159 Type 2 diabetes mellitus with other circulatory complications: Secondary | ICD-10-CM | POA: Diagnosis not present

## 2023-08-30 DIAGNOSIS — N3946 Mixed incontinence: Secondary | ICD-10-CM | POA: Diagnosis not present

## 2023-08-30 NOTE — Patient Outreach (Signed)
 Complex Care Management   Visit Note  08/30/2023  Name:  Tracy Fitzgerald MRN: [id number] DOB: 20-Aug-1965  Situation: Referral received for Complex Care Management related to SDOH Barriers:  Transportation Housing   Food insecurity Financial Resource Strain I obtained verbal consent from Patient.  Visit completed with patient  on the phone  Background:   Past Medical History:  Diagnosis Date   Asthma    CHF (congestive heart failure) (HCC)    Depression    Diabetes mellitus without complication (HCC)    Headache    Hypertension    Pain disorder    Sleep apnea    CPAP    Assessment: BSW called patient to follow up and see if they had received resources. Patient had not received resources, BSW resent resources and will follow up again.     Recommendation:   No recommendations at this time.  Follow Up Plan:   Telephone follow-up 09/08/23 at 3pm Patient has met all care management goals. Care Management case will be closed. Patient has been provided contact information should new needs arise.   Haven Lion, BSW [place]  [facility] Social Worker, [facility] [phone]

## 2023-09-08 ENCOUNTER — Other Ambulatory Visit: Payer: Self-pay

## 2023-09-08 NOTE — Patient Outreach (Signed)
 Social worker followed up with patient to confirm receipt of previously provided resources. Patient reported that they had reviewed the information and had reached out to the listed resources in the past.They stated that they will attempt to reach out again. No additional follow-up is planned at this time. Social worker advised the patient to reach out if further support or additional resources are needed.  Orlean Fey, BSW [place]  [facility] Social Worker, [facility] [phone]

## 2023-09-08 NOTE — Patient Instructions (Signed)
 Visit Information  Tracy Fitzgerald was given information about Medicaid Managed Care team care coordination services as a part of their Healthy [facility] Medicaid benefit. Tracy Fitzgerald verbally consented to engagement with the [facility] Managed Care team.   If you are experiencing a medical emergency, please call 911 or report to your local emergency department or urgent care.   If you have a non-emergency medical problem during routine business hours, please contact your provider's office and ask to speak with a nurse.   For questions related to your Healthy [facility] health plan, please call: [phone] or visit the homepage here: [email or web address]  If you would like to schedule transportation through your Healthy [facility] plan, please call the following number at least 2 days in advance of your appointment: [phone]  For information about your ride after you set it up, call Ride Assist at [phone]. Use this number to activate a Will Call pickup, or if your transportation is late for a scheduled pickup. Use this number, too, if you need to make a change or cancel a previously scheduled reservation.  If you need transportation services right away, call [phone]. The after-hours call center is staffed 24 hours to handle ride assistance and urgent reservation requests (including discharges) 365 days a year. Urgent trips include sick visits, hospital discharge requests and life-sustaining treatment.  Call the [facility] Line at [phone], at any time, 24 hours a day, 7 days a week. If you are in danger or need immediate medical attention call 911.  If you would like help to quit smoking, call 1-800-QUIT-NOW ((803)411-0718) OR Espaol: 1-855-Djelo-Ya (8-144-664-6430) o para ms informacin haga clic aqu or Text READY to 799-599 to register via text    Patient verbalizes understanding of instructions and care plan provided  today and agrees to view in MyChart. Active MyChart status and patient understanding of how to access instructions and care plan via MyChart confirmed with patient.     No further follow up required:    Tracy Fitzgerald, Tracy Fitzgerald  [facility] Social Worker, [facility] [phone]   Following is a copy of your plan of care:  There are no care plans that you recently modified to display for this patient.

## 2023-09-13 ENCOUNTER — Other Ambulatory Visit: Payer: Self-pay

## 2023-09-13 DIAGNOSIS — F32A Depression, unspecified: Secondary | ICD-10-CM

## 2023-09-13 DIAGNOSIS — I5032 Chronic diastolic (congestive) heart failure: Secondary | ICD-10-CM

## 2023-09-13 DIAGNOSIS — E1142 Type 2 diabetes mellitus with diabetic polyneuropathy: Secondary | ICD-10-CM

## 2023-09-13 DIAGNOSIS — Z789 Other specified health status: Secondary | ICD-10-CM

## 2023-09-13 DIAGNOSIS — G4733 Obstructive sleep apnea (adult) (pediatric): Secondary | ICD-10-CM

## 2023-09-13 DIAGNOSIS — F331 Major depressive disorder, recurrent, moderate: Secondary | ICD-10-CM

## 2023-09-13 DIAGNOSIS — M4726 Other spondylosis with radiculopathy, lumbar region: Secondary | ICD-10-CM

## 2023-09-13 DIAGNOSIS — M5410 Radiculopathy, site unspecified: Secondary | ICD-10-CM

## 2023-09-15 ENCOUNTER — Telehealth: Payer: Self-pay | Admitting: *Deleted

## 2023-09-15 NOTE — Patient Outreach (Signed)
 Complex Care Management   Visit Note  09/15/2023  Name:  Tracy Fitzgerald MRN: [id number] DOB: 1965-09-14  Situation: Referral received for Complex Care Management related to Heart Failure I obtained verbal consent from Parent.  Visit completed with patient  on the phone  Background:   Past Medical History:  Diagnosis Date   Asthma    CHF (congestive heart failure) (HCC)    Depression    Diabetes mellitus without complication (HCC)    Headache    Hypertension    Pain disorder    Sleep apnea    CPAP    Assessment: Patient Reported Symptoms:  Cognitive Cognitive Status: No symptoms reported, Able to follow simple commands, Normal speech and language skills, Alert and oriented to person, place, and time   Healing Pattern: Slow  Neurological Neurological Review of Symptoms: Headaches Neurological Management Strategies: Medication therapy Neurological Comment: has hedaches for years and takes advil   HEENT HEENT Symptoms Reported: No symptoms reported      Cardiovascular Cardiovascular Symptoms Reported: Swelling in legs or feet Cardiovascular Management Strategies: Medication therapy  Respiratory Respiratory Symptoms Reported: Shortness of breath Other Respiratory Symptoms: pollen and heat hases using her inhaler a lot Respiratory Management Strategies: Coping strategies, Medication therapy  Endocrine Is patient diabetic?: Yes Is patient checking blood sugars at home?: Yes List most recent blood sugar readings, include date and time of day: has not checked it all week    Gastrointestinal Gastrointestinal Symptoms Reported: Diarrhea Gastrointestinal Management Strategies: Incontinence garment/pad    Genitourinary Genitourinary Symptoms Reported: Incontinence Genitourinary Management Strategies: Incontinence garment/pad  Integumentary Integumentary Symptoms Reported: Rash, Wound Additional Integumentary Details: she uses corn starch under stomch and between her legs.  Her  umbilical hernis she has a rash on both sides from sweating Skin Management Strategies: Medication therapy, Dressing changes  Musculoskeletal Musculoskelatal Symptoms Reviewed: Difficulty walking, Back pain, Unsteady gait, Weakness, Limited mobility Musculoskeletal Management Strategies: Medication therapy, Medical device, Coping strategies Musculoskeletal Self-Management Outcome: 3 (uncertain)      Psychosocial       Quality of Family Relationships: supportive Do you feel physically threatened by others?: No      09/13/2023    3:56 PM  Depression screen PHQ 2/9  Decreased Interest 3  Down, Depressed, Hopeless 3  PHQ - 2 Score 6  Altered sleeping 2  Tired, decreased energy 3  Change in appetite 3  Feeling bad or failure about yourself  3  Trouble concentrating 3  Moving slowly or fidgety/restless 0  Suicidal thoughts 1  PHQ-9 Score 21    There were no vitals filed for this visit.  Medications Reviewed Today     Reviewed by Weyman Corning, RN (Registered Nurse) on 09/13/23 at 1539  Med List Status: <None>   Medication Order Taking? Sig Documenting Provider Last Dose Status Informant  Accu-Chek Softclix Lancets lancets [id number] Yes Test blood sugar once a day Vicci Barnie NOVAK, MD  Active   albuterol  (VENTOLIN  HFA) 108 (90 Base) MCG/ACT inhaler [id number] Yes INHALE 2 PUFFS INTO THE LUNGS EVERY 4 HOURS AS NEEDED FOR WHEEZING OR SHORTNESS OF BREATH. Vicci Barnie NOVAK, MD  Active   ARIPiprazole  (ABILIFY ) 5 MG tablet [id number] Yes Take 1 tablet (5 mg total) by mouth daily. Vicci Barnie NOVAK, MD  Active   aspirin  EC 81 MG tablet [id number] Yes Take 1 tablet (81 mg total) by mouth daily. Swallow whole. Vicci Barnie NOVAK, MD  Active   atorvastatin  (LIPITOR) 10 MG tablet [id number] Yes Take 1 tablet (10  mg total) by mouth daily. Vicci Barnie NOVAK, MD  Active   Blood Glucose Monitoring Suppl (ACCU-CHEK GUIDE) w/Device KIT [id number] Yes Test blood sugar once a day Vicci Barnie NOVAK, MD   Active   Blood Glucose Monitoring Suppl (TRUE METRIX METER) w/Device PRESSLEY [id number]  Use as directed Vicci Barnie NOVAK, MD  Active Self  buPROPion  (WELLBUTRIN  SR) 150 MG 12 hr tablet [id number] Yes Take 1 tablet (150 mg total) by mouth 2 (two) times daily. Vicci Barnie NOVAK, MD  Active   busPIRone  (BUSPAR ) 10 MG tablet [id number] Yes TAKE 1 TABLET BY MOUTH THREE TIMES A DAY Vicci Barnie NOVAK, MD  Active   Cholecalciferol (VITAMIN D  PO) [id number] Yes Take 1 tablet by mouth daily.  [provider]  Active Self  furosemide  (LASIX ) 20 MG tablet [id number] Yes Take 1 tablet by mouth daily for 5 days then as needed for edema Danton Slough M, PA-C  Active   gabapentin  (NEURONTIN ) 300 MG capsule [id number] Yes Take 2 capsules (600 mg total) by mouth 3 (three) times daily AND 3 capsules (900 mg total) at bedtime. Danton Slough M, [place]  Active   glucose blood (ACCU-CHEK GUIDE) test strip [id number] Yes Test blood sugar once a day Vicci Barnie NOVAK, MD  Active   lidocaine  (LIDODERM ) 5 % [id number] Yes Place 1 patch onto the skin daily. Apply to the lower back daily and leave on for 12 hours. Vicci Barnie NOVAK, MD  Active   lisinopril  (ZESTRIL ) 5 MG tablet [id number] Yes Take 1 tablet (5 mg total) by mouth daily. Danton Slough M, PA-C  Active   melatonin 3 MG TABS tablet [id number] Yes Take 1 tablet (3 mg total) by mouth at bedtime. Harl Zane BRAVO, NP  Active   meloxicam  (MOBIC ) 15 MG tablet [id number] Yes Take 1 tablet (15 mg total) by mouth daily as needed for pain Danton Slough M, PA-C  Active   metFORMIN  (GLUCOPHAGE -XR) 500 MG 24 hr tablet [id number] Yes Take 1 tablet (500 mg total) by mouth 2 (two) times daily with a meal. Vicci Barnie NOVAK, MD  Active   sertraline  (ZOLOFT ) 100 MG tablet [id number] Yes Take 2 tablets (200 mg total) by mouth daily. Vicci Barnie NOVAK, MD  Active             Recommendation:   PCP Follow-up  Follow Up Plan:   Telephone follow up appointment date/time:   09/30/23  330 pm  Wilbert Diver RN, BSN, [facility]  [facility], [facility]    Care Coordinator Phone: [phone]

## 2023-09-15 NOTE — Progress Notes (Signed)
 Complex Care Management Note Care Guide Note  09/15/2023 Name: Tracy Fitzgerald MRN: [id number] DOB: September 03, 1965   Complex Care Management Outreach Attempts: An unsuccessful telephone outreach was attempted today to offer the patient information about available complex care management services.  Follow Up Plan:  Additional outreach attempts will be made to offer the patient complex care management information and services.   Encounter Outcome:  No Answer  Harlene Satterfield  [facility] Health  [facility], [facility] Guide  Direct Dial: [phone]  Fax [phone]

## 2023-09-15 NOTE — Patient Instructions (Signed)
 Visit Information  Tracy Fitzgerald was given information about Medicaid Managed Care team care coordination services as a part of their Healthy [facility] Medicaid benefit. Tracy Fitzgerald verbally consented to engagement with the [facility] Managed Care team.   If you are experiencing a medical emergency, please call 911 or report to your local emergency department or urgent care.   If you have a non-emergency medical problem during routine business hours, please contact your provider's office and ask to speak with a nurse.   For questions related to your Healthy [facility] health plan, please call: [phone] or visit the homepage here: [email or web address]  If you would like to schedule transportation through your Healthy [facility] plan, please call the following number at least 2 days in advance of your appointment: [phone]  For information about your ride after you set it up, call Ride Assist at [phone]. Use this number to activate a Will Call pickup, or if your transportation is late for a scheduled pickup. Use this number, too, if you need to make a change or cancel a previously scheduled reservation.  If you need transportation services right away, call [phone]. The after-hours call center is staffed 24 hours to handle ride assistance and urgent reservation requests (including discharges) 365 days a year. Urgent trips include sick visits, hospital discharge requests and life-sustaining treatment.  Call the [facility] Line at [phone], at any time, 24 hours a day, 7 days a week. If you are in danger or need immediate medical attention call 911.  If you would like help to quit smoking, call 1-800-QUIT-NOW ((303)638-8139) OR Espaol: 1-855-Djelo-Ya (8-144-664-6430) o para ms informacin haga clic aqu or Text READY to 799-599 to register via text  Tracy Fitzgerald - following are the goals we discussed in your visit today:    Goals Addressed             This Visit's Progress    VBCI RN Care Plan- CHF       Problems:  Chronic Disease Management support and education needs related to CHF  Goal: Over the next 30 days the Patient will Attend all scheduled medical appointments with both the primary care provider and specialists, thereby demonstrating compliance through consistent attendance.     Demonstrate Ongoing adherence to prescribed treatment plan for CHF as evidenced by no admissions to the hospital Verbalize basic understanding of CHF* disease process and self health management plan as evidenced by verbal explanation lifestyle chang Administer all medications precisely as prescribed and proactively reach out to the healthcare provider for any inquiries related to medications, thus ensuring compliance with the medication regimen.    Interventions:   STOP LIGHT  Heart Failure (HF) Definition: When the heart muscle does not pump enough blood through the heart, leaving it very weak.   Some Heart Failure signs/symptoms:  Shortness of breath with or without activities Weakness Swelling in your legs, ankles, feet, and/or abdomen Sudden weight gain from fluid retention Coughing or wheezing with possible mucus build up  Things you can do: Improve your quality of life by managing your stress levels, reducing the salt in your diet, exercise and lose weight if necessary.     Heart Failure Interventions: Basic overview and discussion of pathophysiology of Heart Failure reviewed Reviewed Heart Failure Action Plan in depth and provided written copy Reviewed role of diuretics in prevention of fluid overload and management of heart failure; Discussed the importance of keeping all appointments with provider Screening for signs and symptoms of depression  related to chronic disease state  Assessed social determinant of health barriers   Patient Self-Care Activities:  Attend all scheduled provider  appointments Call pharmacy for medication refills 3-7 days in advance of running out of medications Call provider office for new concerns or questions  Take medications as prescribed   call office if I gain more than 2 pounds in one day or 5 pounds in one week do ankle pumps when sitting watch for swelling in feet, ankles and legs every day develop a rescue plan track symptoms and what helps feel better or worse Referral sent for housing and counseling Message sent to PCP for podiatry refferal  Plan:  Telephone follow up appointment with care management team member scheduled for:  09/30/23  330 pm             Please see education materials related to CHF provided by MyChart link.  Patient verbalizes understanding of instructions and care plan provided today and agrees to view in MyChart. Active MyChart status and patient understanding of how to access instructions and care plan via MyChart confirmed with patient.     Telephone follow up appointment with care management team member scheduled for: 09/30/23  330 pm  Tracy Diver RN, BSN, [facility]  [facility], [facility] Health    Care Coordinator Phone: [phone]      Following is a copy of your plan of care:  There are no care plans that you recently modified to display for this patient.

## 2023-09-15 NOTE — Patient Outreach (Signed)
 Complex Care Management   Visit Note  09/13/2023  Name:  Tracy Fitzgerald MRN: [id number] DOB: [birth year]  Situation: Referral received for Complex Care Management related to Heart Failure I obtained verbal consent from Patient.  Visit completed with patient  on the phone  Background:   Past Medical History:  Diagnosis Date   Asthma    CHF (congestive heart failure) (HCC)    Depression    Diabetes mellitus without complication (HCC)    Headache    Hypertension    Pain disorder    Sleep apnea    CPAP    Assessment: Patient Reported Symptoms:  Cognitive Cognitive Status: No symptoms reported, Able to follow simple commands, Normal speech and language skills, Alert and oriented to person, place, and time   Healing Pattern: Slow  Neurological Neurological Review of Symptoms: Headaches Neurological Management Strategies: Medication therapy Neurological Comment: has hedaches for years and takes advil   HEENT HEENT Symptoms Reported: No symptoms reported      Cardiovascular Cardiovascular Symptoms Reported: Swelling in legs or feet Cardiovascular Management Strategies: Medication therapy  Respiratory Respiratory Symptoms Reported: Shortness of breath Other Respiratory Symptoms: pollen and heat hases using her inhaler a lot Respiratory Management Strategies: Coping strategies, Medication therapy  Endocrine Is patient diabetic?: Yes Is patient checking blood sugars at home?: Yes List most recent blood sugar readings, include date and time of day: has not checked it all week    Gastrointestinal Gastrointestinal Symptoms Reported: Diarrhea Gastrointestinal Management Strategies: Incontinence garment/pad    Genitourinary Genitourinary Symptoms Reported: Incontinence Genitourinary Management Strategies: Incontinence garment/pad  Integumentary Integumentary Symptoms Reported: Rash, Wound Additional Integumentary Details: she uses corn starch under stomch and between her legs.  Her  umbilical hernis she has a rash on both sides from sweating Skin Management Strategies: Medication therapy, Dressing changes  Musculoskeletal Musculoskelatal Symptoms Reviewed: Difficulty walking, Back pain, Unsteady gait, Weakness, Limited mobility Musculoskeletal Management Strategies: Medication therapy, Medical device, Coping strategies Musculoskeletal Self-Management Outcome: 3 (uncertain)      Psychosocial       Quality of Family Relationships: supportive Do you feel physically threatened by others?: No      09/13/2023    3:56 PM  Depression screen PHQ 2/9  Decreased Interest 3  Down, Depressed, Hopeless 3  PHQ - 2 Score 6  Altered sleeping 2  Tired, decreased energy 3  Change in appetite 3  Feeling bad or failure about yourself  3  Trouble concentrating 3  Moving slowly or fidgety/restless 0  Suicidal thoughts 1  PHQ-9 Score 21    There were no vitals filed for this visit.  Medications Reviewed Today     Reviewed by Weyman Corning, RN (Registered Nurse) on 09/13/23 at 1539  Med List Status: <None>   Medication Order Taking? Sig Documenting Provider Last Dose Status Informant  Accu-Chek Softclix Lancets lancets [id number] Yes Test blood sugar once a day Vicci Barnie NOVAK, MD  Active   albuterol  (VENTOLIN  HFA) 108 (90 Base) MCG/ACT inhaler [id number] Yes INHALE 2 PUFFS INTO THE LUNGS EVERY 4 HOURS AS NEEDED FOR WHEEZING OR SHORTNESS OF BREATH. Vicci Barnie NOVAK, MD  Active   ARIPiprazole  (ABILIFY ) 5 MG tablet [id number] Yes Take 1 tablet (5 mg total) by mouth daily. Vicci Barnie NOVAK, MD  Active   aspirin  EC 81 MG tablet [id number] Yes Take 1 tablet (81 mg total) by mouth daily. Swallow whole. Vicci Barnie NOVAK, MD  Active   atorvastatin  (LIPITOR) 10 MG tablet [id number] Yes Take 1 tablet (10  mg total) by mouth daily. Vicci Barnie NOVAK, MD  Active   Blood Glucose Monitoring Suppl (ACCU-CHEK GUIDE) w/Device KIT [id number] Yes Test blood sugar once a day Vicci Barnie NOVAK, MD   Active   Blood Glucose Monitoring Suppl (TRUE METRIX METER) w/Device PRESSLEY [id number]  Use as directed Vicci Barnie NOVAK, MD  Active Self  buPROPion  (WELLBUTRIN  SR) 150 MG 12 hr tablet [id number] Yes Take 1 tablet (150 mg total) by mouth 2 (two) times daily. Vicci Barnie NOVAK, MD  Active   busPIRone  (BUSPAR ) 10 MG tablet [id number] Yes TAKE 1 TABLET BY MOUTH THREE TIMES A DAY Vicci Barnie NOVAK, MD  Active   Cholecalciferol (VITAMIN D  PO) [id number] Yes Take 1 tablet by mouth daily.  [provider]  Active Self  furosemide  (LASIX ) 20 MG tablet [id number] Yes Take 1 tablet by mouth daily for 5 days then as needed for edema Danton Jon HERO, PA-C  Active   gabapentin  (NEURONTIN ) 300 MG capsule [id number] Yes Take 2 capsules (600 mg total) by mouth 3 (three) times daily AND 3 capsules (900 mg total) at bedtime. Danton Jon M, [place]  Active   glucose blood (ACCU-CHEK GUIDE) test strip [id number] Yes Test blood sugar once a day Vicci Barnie NOVAK, MD  Active   lidocaine  (LIDODERM ) 5 % [id number] Yes Place 1 patch onto the skin daily. Apply to the lower back daily and leave on for 12 hours. Vicci Barnie NOVAK, MD  Active   lisinopril  (ZESTRIL ) 5 MG tablet [id number] Yes Take 1 tablet (5 mg total) by mouth daily. Danton Jon M, PA-C  Active   melatonin 3 MG TABS tablet [id number] Yes Take 1 tablet (3 mg total) by mouth at bedtime. Harl Zane BRAVO, NP  Active   meloxicam  (MOBIC ) 15 MG tablet [id number] Yes Take 1 tablet (15 mg total) by mouth daily as needed for pain Danton Jon M, PA-C  Active   metFORMIN  (GLUCOPHAGE -XR) 500 MG 24 hr tablet [id number] Yes Take 1 tablet (500 mg total) by mouth 2 (two) times daily with a meal. Vicci Barnie NOVAK, MD  Active   sertraline  (ZOLOFT ) 100 MG tablet [id number] Yes Take 2 tablets (200 mg total) by mouth daily. Vicci Barnie NOVAK, MD  Active             Recommendation:   PCP Follow-up  Follow Up Plan:   Telephone follow up appointment date/time:   09/30/23  330 pm  Wilbert Diver RN, BSN, [facility] [place]  [facility], [facility] Health    Care Coordinator Phone: [phone]

## 2023-09-16 ENCOUNTER — Telehealth: Payer: Self-pay | Admitting: Internal Medicine

## 2023-09-16 DIAGNOSIS — M2142 Flat foot [pes planus] (acquired), left foot: Secondary | ICD-10-CM

## 2023-09-16 NOTE — Telephone Encounter (Signed)
-----   Message from Nurse Traci C sent at 09/15/2023 12:01 AM EDT ----- Regarding: Request for Podiatrist Referral Dear Dr. Vicci,  I hope this message finds you in good health. I recently communicated with the patient, and she has expressed a desire for a referral to a podiatrist due to her flat feet. She indicated that her feet have been experiencing significant pain, and she believes that a podiatrist may assist her in finding suitable footwear.  Thank you for your attention to this matter.  Sincerely,     Wilbert Diver RN, BSN, [facility]  [facility], [facility] Health    Care Coordinator Phone: [phone]

## 2023-09-19 NOTE — Progress Notes (Unsigned)
 Complex Care Management Note Care Guide Note  09/19/2023 Name: Tracy Fitzgerald MRN: [id number] DOB: [birth year]   Complex Care Management Outreach Attempts: An unsuccessful telephone outreach was attempted today to offer the patient information about available complex care management services.  Follow Up Plan:  Additional outreach attempts will be made to offer the patient complex care management information and services.   Encounter Outcome:  No Answer  Harlene Satterfield  [facility]  [facility], [facility] Guide  Direct Dial: [phone]  Fax [phone]

## 2023-09-20 NOTE — Progress Notes (Signed)
 Complex Care Management Care Guide Note  09/20/2023 Name: Tracy Fitzgerald MRN: [id number] DOB: [birth year]  Tracy Fitzgerald is a 58 y.o. year old female who is a primary care patient of Vicci Barnie NOVAK, MD and is actively engaged with the care management team. I reached out to Macario Och by phone today to assist with scheduling  with the Licensed Clinical Social Worker.  Follow up plan: Unsuccessful telephone outreach attempt made. A HIPAA compliant phone message was left for the patient providing contact information and requesting a return call. No further outreach attempts will be made at this time to schedule with LCSW.    Harlene Satterfield  [facility]  [facility], [facility] Guide  Direct Dial: [phone]  Fax [phone]

## 2023-09-30 ENCOUNTER — Other Ambulatory Visit: Payer: Self-pay

## 2023-09-30 DIAGNOSIS — E1159 Type 2 diabetes mellitus with other circulatory complications: Secondary | ICD-10-CM | POA: Diagnosis not present

## 2023-09-30 DIAGNOSIS — N3946 Mixed incontinence: Secondary | ICD-10-CM | POA: Diagnosis not present

## 2023-09-30 NOTE — Patient Instructions (Addendum)
 Visit Information  Ms. Finnell was given information about Medicaid Managed Care team care coordination services as a part of their Healthy [facility] Medicaid benefit. Banesa Tristan verbally consented to engagement with the [facility] Managed Care team.   If you are experiencing a medical emergency, please call 911 or report to your local emergency department or urgent care.   If you have a non-emergency medical problem during routine business hours, please contact your provider's office and ask to speak with a nurse.   For questions related to your Healthy [facility] health plan, please call: [phone] or visit the homepage here: [email or web address]  If you would like to schedule transportation through your Healthy [facility] plan, please call the following number at least 2 days in advance of your appointment: [phone]  For information about your ride after you set it up, call Ride Assist at [phone]. Use this number to activate a Will Call pickup, or if your transportation is late for a scheduled pickup. Use this number, too, if you need to make a change or cancel a previously scheduled reservation.  If you need transportation services right away, call [phone]. The after-hours call center is staffed 24 hours to handle ride assistance and urgent reservation requests (including discharges) 365 days a year. Urgent trips include sick visits, hospital discharge requests and life-sustaining treatment.  Call the [facility] Line at [phone], at any time, 24 hours a day, 7 days a week. If you are in danger or need immediate medical attention call 911.  If you would like help to quit smoking, call 1-800-QUIT-NOW (236-360-0347) OR Espaol: 1-855-Djelo-Ya (8-144-664-6430) o para ms informacin haga clic aqu or Text READY to 799-599 to register via text  Ms. Tracy Fitzgerald - following are the goals we discussed in your visit today:    Goals Addressed             This Visit's Progress    VBCI RN Care Plan- CHF       Problems:  Chronic Disease Management support and education needs related to CHF  Goal: Over the next 30 days the Patient will Attend all scheduled medical appointments with both the primary care provider and specialists, thereby demonstrating compliance through consistent attendance.     Demonstrate Ongoing adherence to prescribed treatment plan for CHF as evidenced by no admissions to the hospital Verbalize basic understanding of CHF* disease process and self health management plan as evidenced by verbal explanation lifestyle chang Administer all medications precisely as prescribed and proactively reach out to the healthcare provider for any inquiries related to medications, thus ensuring compliance with the medication regimen.    Interventions:   STOP LIGHT  Heart Failure (HF) Definition: When the heart muscle does not pump enough blood through the heart, leaving it very weak.   Some Heart Failure signs/symptoms:  Shortness of breath with or without activities Weakness Swelling in your legs, ankles, feet, and/or abdomen Sudden weight gain from fluid retention Coughing or wheezing with possible mucus build up  Things you can do: Improve your quality of life by managing your stress levels, reducing the salt in your diet, exercise and lose weight if necessary.     Heart Failure Interventions: Basic overview and discussion of pathophysiology of Heart Failure reviewed Reviewed role of diuretics in prevention of fluid overload and management of heart failure; Discussed the importance of keeping all appointments with provider Screening for signs and symptoms of depression related to chronic disease state  Assessed social determinant of health  barriers  Reviewed Heart Failure action plan Advised the patient if she is in the red zone to call 911  Patient Self-Care Activities:  Attend all  scheduled provider appointments Call pharmacy for medication refills 3-7 days in advance of running out of medications Call provider office for new concerns or questions  Take medications as prescribed   call office if I gain more than 2 pounds in one day or 5 pounds in one week do ankle pumps when sitting watch for swelling in feet, ankles and legs every day develop a rescue plan track symptoms and what helps feel better or worse Referral sent for housing and counseling Message sent to PCP for podiatry refferal  Plan:  Telephone follow up appointment with care management team member scheduled for:  10/31/23  245 pm Rosaline Finlay RNCM             Please see education materials related to CHF provided by MyChart link.  Patient verbalizes understanding of instructions and care plan provided today and agrees to view in MyChart. Active MyChart status and patient understanding of how to access instructions and care plan via MyChart confirmed with patient.     Telephone follow up appointment with care management team member scheduled for:  10/31/23  245 pm Rosaline Finlay RNCM    Wilbert Diver RN, BSN, [facility]  [facility], [facility]    Care Coordinator Phone: [phone]      Following is a copy of your plan of care:  There are no care plans that you recently modified to display for this patient.

## 2023-10-01 NOTE — Patient Outreach (Signed)
 Complex Care Management   Visit Note  10/01/2023  Name:  Tracy Fitzgerald MRN: [id number] DOB: 1966-03-02  Situation: Referral received for Complex Care Management related to Heart Failure I obtained verbal consent from Patient.  Visit completed with patient  on the phone  Background:   Past Medical History:  Diagnosis Date   Asthma    CHF (congestive heart failure) (HCC)    Depression    Diabetes mellitus without complication (HCC)    Headache    Hypertension    Pain disorder    Sleep apnea    CPAP    Assessment: I spoke with Tracy Fitzgerald and she stated that the heat was starting to affect her breathing.  We reviewed the CHF action plan.  she knows to go to the hospital if she is in disdtress.  i gave her several numbers to help her.  the numbers for her insurance [phone] for transportation [phone], Dr Newman office for Eye care [phone] and [facility] [phone].  Patient Reported Symptoms:  Cognitive Cognitive Status: Able to follow simple commands, Alert and oriented to person, place, and time, Normal speech and language skills      Neurological Neurological Review of Symptoms: Not assessed    HEENT HEENT Symptoms Reported: Not assessed      Cardiovascular Cardiovascular Symptoms Reported: Swelling in legs or feet Cardiovascular Management Strategies: Medication therapy Cardiovascular Comment: She does have her fureemide to take.  Respiratory Respiratory Symptoms Reported: Shortness of breath Other Respiratory Symptoms: patient is living in her tent and it iss very humid for her.  She has her inhaler that she iws able to use Respiratory Management Strategies: Medication therapy  Endocrine Endocrine Symptoms Reported: Not assessed    Gastrointestinal Gastrointestinal Symptoms Reported: Not assessed      Genitourinary Genitourinary Symptoms Reported: Not assessed    Integumentary Integumentary Symptoms Reported: Not  assessed    Musculoskeletal Musculoskelatal Symptoms Reviewed: Not assessed        Psychosocial Psychosocial Symptoms Reported: Not assessed            09/13/2023    3:56 PM  Depression screen PHQ 2/9  Decreased Interest 3  Down, Depressed, Hopeless 3  PHQ - 2 Score 6  Altered sleeping 2  Tired, decreased energy 3  Change in appetite 3  Feeling bad or failure about yourself  3  Trouble concentrating 3  Moving slowly or fidgety/restless 0  Suicidal thoughts 1  PHQ-9 Score 21    There were no vitals filed for this visit.  Medications Reviewed Today     Reviewed by Weyman Corning, RN (Registered Nurse) on 10/01/23 at 2218  Med List Status: <None>   Medication Order Taking? Sig Documenting Provider Last Dose Status Informant  Accu-Chek Softclix Lancets lancets [id number]  Test blood sugar once a day Vicci Barnie NOVAK, MD  Active   albuterol  (VENTOLIN  HFA) 108 (90 Base) MCG/ACT inhaler [id number]  INHALE 2 PUFFS INTO THE LUNGS EVERY 4 HOURS AS NEEDED FOR WHEEZING OR SHORTNESS OF BREATH. Vicci Barnie NOVAK, MD  Active   ARIPiprazole  (ABILIFY ) 5 MG tablet [id number]  Take 1 tablet (5 mg total) by mouth daily. Vicci Barnie NOVAK, MD  Active   aspirin  EC 81 MG tablet [id number]  Take 1 tablet (81 mg total) by mouth daily. Swallow whole. Vicci Barnie NOVAK, MD  Active   atorvastatin  (LIPITOR) 10 MG tablet [id number]  Take 1 tablet (10 mg total) by mouth daily. Vicci Barnie NOVAK, MD  Active   Blood Glucose Monitoring Suppl (ACCU-CHEK GUIDE) w/Device KIT [id number]  Test blood sugar once a day Vicci Barnie NOVAK, MD  Active   Blood Glucose Monitoring Suppl (TRUE METRIX METER) w/Device KIT [id number] No Use as directed Vicci Barnie NOVAK, MD Taking Active Self  buPROPion  (WELLBUTRIN  SR) 150 MG 12 hr tablet [id number]  Take 1 tablet (150 mg total) by mouth 2 (two) times daily. Vicci Barnie NOVAK, MD  Active   busPIRone  (BUSPAR ) 10 MG tablet [id number]  TAKE 1 TABLET BY MOUTH THREE TIMES A DAY  Vicci Barnie NOVAK, MD  Active   Cholecalciferol (VITAMIN D  PO) [id number]  Take 1 tablet by mouth daily.  [provider]  Active Self  furosemide  (LASIX ) 20 MG tablet [id number]  Take 1 tablet by mouth daily for 5 days then as needed for edema Danton Jon HERO, PA-C  Active   gabapentin  (NEURONTIN ) 300 MG capsule [id number]  Take 2 capsules (600 mg total) by mouth 3 (three) times daily AND 3 capsules (900 mg total) at bedtime. Danton Jon M, [place]  Active   glucose blood (ACCU-CHEK GUIDE) test strip [id number]  Test blood sugar once a day Vicci Barnie NOVAK, MD  Active   lidocaine  (LIDODERM ) 5 % [id number]  Place 1 patch onto the skin daily. Apply to the lower back daily and leave on for 12 hours. Vicci Barnie NOVAK, MD  Active   lisinopril  (ZESTRIL ) 5 MG tablet [id number]  Take 1 tablet (5 mg total) by mouth daily. Danton Jon M, PA-C  Active   melatonin 3 MG TABS tablet [id number]  Take 1 tablet (3 mg total) by mouth at bedtime. Harl Zane BRAVO, NP  Active   meloxicam  (MOBIC ) 15 MG tablet [id number]  Take 1 tablet (15 mg total) by mouth daily as needed for pain Danton Jon M, PA-C  Active   metFORMIN  (GLUCOPHAGE -XR) 500 MG 24 hr tablet [id number]  Take 1 tablet (500 mg total) by mouth 2 (two) times daily with a meal. Vicci Barnie NOVAK, MD  Active   sertraline  (ZOLOFT ) 100 MG tablet [id number]  Take 2 tablets (200 mg total) by mouth daily. Vicci Barnie NOVAK, MD  Active             Recommendation:   PCP Follow-up  Follow Up Plan:   Telephone follow up appointment with care management team member scheduled for:  10/31/23  245 pm Rosaline Finlay RNCM    Wilbert Diver RN, BSN, [facility] [place]  [facility], [facility]    Care Coordinator Phone: [phone]

## 2023-10-02 ENCOUNTER — Other Ambulatory Visit: Payer: Self-pay | Admitting: Internal Medicine

## 2023-10-02 DIAGNOSIS — J439 Emphysema, unspecified: Secondary | ICD-10-CM

## 2023-10-05 ENCOUNTER — Ambulatory Visit: Payer: Self-pay

## 2023-10-05 NOTE — Telephone Encounter (Signed)
 FYI Only or Action Required?: FYI only for provider.  Patient was last seen in primary care on 08/02/2023 by Vicci Barnie NOVAK, MD.  Called Nurse Triage reporting No chief complaint on file..  Symptoms began Chronic.  Interventions attempted: OTC medications: albuterol , ran out - this RN confirmed [facility] has order and patient made aware.  Symptoms are: gradually worsening.  Triage Disposition: See Physician Within 24 Hours (overriding See Note) - states will go to ED tomorrow 10/06/23.  Patient/caregiver understands and will follow disposition?:  Reason for Disposition  [1] MILD difficulty breathing (e.g., minimal/no SOB at rest, SOB with walking, pulse < 100) AND [2] NEW-onset or WORSE than normal  [1] Longstanding difficulty breathing (e.g., CHF, COPD, emphysema) AND [2] WORSE than normal  Answer Assessment - Initial Assessment Questions Using albuterol  q4 hrs d/t humidity and is out. This RN confirmed with [facility] that they have it for the patient and patient made aware. Pt states breathing is worse than her baseline. Reports shortness of breath when laying down or with exertion. Denies SOB or wheezing at rest. Denies lower extremity edema.  This RN advised that she should go to the ED or call 911 based on her reported hx of CH as well as her comorbidities. Patient refused, stating that she wants to go there tomorrow when she has access to transportation. Advised that 911 would be appropriate and pt refused again. Pt also refused UC. (States does not have pulsox to check SPO2). ED precautions reviewed and pt verbalized understanding.  1. RESPIRATORY STATUS: Describe your breathing? (e.g., wheezing, shortness of breath, unable to speak, severe coughing)      SOB with exertion or when laying down.  2. ONSET: When did this breathing problem begin?      Chronic, worse in humidity  3. PATTERN Does the difficult breathing come and go, or has it been constant since it started?       Intermittent  5. RECURRENT SYMPTOM: Have you had difficulty breathing before? If Yes, ask: When was the last time? and What happened that time?      Yes 6. CARDIAC HISTORY: Do you have any history of heart disease? (e.g., heart attack, angina, bypass surgery, angioplasty)      Pt reports hx of CHF  8. CAUSE: What do you think is causing the breathing problem?      Humidity  Protocols used: Breathing Difficulty-A-AH Copied from CRM [id number]. Topic: Clinical - Red Word Triage >> Oct 05, 2023  3:11 PM Deleta RAMAN wrote: Red Word that prompted transfer to Nurse Triage: patient is having trouble breathing due to medication being out (inhaler) as of Friday

## 2023-10-05 NOTE — Telephone Encounter (Signed)
 Noted

## 2023-10-06 ENCOUNTER — Emergency Department (HOSPITAL_COMMUNITY)

## 2023-10-06 ENCOUNTER — Ambulatory Visit: Admitting: Podiatry

## 2023-10-06 ENCOUNTER — Encounter (HOSPITAL_COMMUNITY): Payer: Self-pay

## 2023-10-06 ENCOUNTER — Other Ambulatory Visit: Payer: Self-pay

## 2023-10-06 ENCOUNTER — Emergency Department (HOSPITAL_COMMUNITY)
Admission: EM | Admit: 2023-10-06 | Discharge: 2023-10-06 | Disposition: A | Discharge status: Home / Self Care | Source: Skilled Nursing Facility | Attending: Emergency Medicine | Admitting: Emergency Medicine

## 2023-10-06 DIAGNOSIS — R0989 Other specified symptoms and signs involving the circulatory and respiratory systems: Secondary | ICD-10-CM | POA: Diagnosis not present

## 2023-10-06 DIAGNOSIS — I509 Heart failure, unspecified: Secondary | ICD-10-CM | POA: Diagnosis not present

## 2023-10-06 DIAGNOSIS — R0602 Shortness of breath: Secondary | ICD-10-CM | POA: Diagnosis present

## 2023-10-06 DIAGNOSIS — N3946 Mixed incontinence: Secondary | ICD-10-CM | POA: Diagnosis not present

## 2023-10-06 DIAGNOSIS — I10 Essential (primary) hypertension: Secondary | ICD-10-CM | POA: Diagnosis not present

## 2023-10-06 DIAGNOSIS — Z7982 Long term (current) use of aspirin: Secondary | ICD-10-CM | POA: Diagnosis not present

## 2023-10-06 DIAGNOSIS — Z720 Tobacco use: Secondary | ICD-10-CM | POA: Diagnosis not present

## 2023-10-06 DIAGNOSIS — I110 Hypertensive heart disease with heart failure: Secondary | ICD-10-CM | POA: Diagnosis not present

## 2023-10-06 DIAGNOSIS — J9811 Atelectasis: Secondary | ICD-10-CM | POA: Diagnosis not present

## 2023-10-06 DIAGNOSIS — F411 Generalized anxiety disorder: Secondary | ICD-10-CM | POA: Diagnosis not present

## 2023-10-06 DIAGNOSIS — I5033 Acute on chronic diastolic (congestive) heart failure: Secondary | ICD-10-CM | POA: Insufficient documentation

## 2023-10-06 DIAGNOSIS — J811 Chronic pulmonary edema: Secondary | ICD-10-CM | POA: Diagnosis not present

## 2023-10-06 DIAGNOSIS — G894 Chronic pain syndrome: Secondary | ICD-10-CM | POA: Diagnosis not present

## 2023-10-06 DIAGNOSIS — F329 Major depressive disorder, single episode, unspecified: Secondary | ICD-10-CM | POA: Diagnosis not present

## 2023-10-06 DIAGNOSIS — M5416 Radiculopathy, lumbar region: Secondary | ICD-10-CM | POA: Diagnosis not present

## 2023-10-06 DIAGNOSIS — E1140 Type 2 diabetes mellitus with diabetic neuropathy, unspecified: Secondary | ICD-10-CM | POA: Diagnosis not present

## 2023-10-06 DIAGNOSIS — J45909 Unspecified asthma, uncomplicated: Secondary | ICD-10-CM | POA: Diagnosis not present

## 2023-10-06 DIAGNOSIS — G4733 Obstructive sleep apnea (adult) (pediatric): Secondary | ICD-10-CM | POA: Diagnosis not present

## 2023-10-06 DIAGNOSIS — R079 Chest pain, unspecified: Secondary | ICD-10-CM | POA: Diagnosis not present

## 2023-10-06 LAB — CBC
HCT: 48.5 % — ABNORMAL HIGH (ref 36.0–46.0)
Hemoglobin: 16 g/dL — ABNORMAL HIGH (ref 12.0–15.0)
MCH: 31.6 pg (ref 26.0–34.0)
MCHC: 33 g/dL (ref 30.0–36.0)
MCV: 95.7 fL (ref 80.0–100.0)
Platelets: 297 K/uL (ref 150–400)
RBC: 5.07 MIL/uL (ref 3.87–5.11)
RDW: 14.6 % (ref 11.5–15.5)
WBC: 13.5 K/uL — ABNORMAL HIGH (ref 4.0–10.5)
nRBC: 0 % (ref 0.0–0.2)

## 2023-10-06 LAB — TROPONIN I (HIGH SENSITIVITY)
Troponin I (High Sensitivity): 20 ng/L — ABNORMAL HIGH (ref <18)
Troponin I (High Sensitivity): 18 ng/L — ABNORMAL HIGH (ref <18)

## 2023-10-06 LAB — BASIC METABOLIC PANEL WITH GFR
Anion gap: 12 (ref 5–15)
BUN: 15 mg/dL (ref 6–20)
CO2: 23 mmol/L (ref 22–32)
Calcium: 9.6 mg/dL (ref 8.9–10.3)
Chloride: 103 mmol/L (ref 98–111)
Creatinine, Ser: 1.24 mg/dL — ABNORMAL HIGH (ref 0.44–1.00)
GFR, Estimated: 50 mL/min — ABNORMAL LOW (ref >60)
Glucose, Bld: 179 mg/dL — ABNORMAL HIGH (ref 70–99)
Potassium: 4.1 mmol/L (ref 3.5–5.1)
Sodium: 138 mmol/L (ref 135–145)

## 2023-10-06 LAB — MAGNESIUM: Magnesium: 2 mg/dL (ref 1.7–2.4)

## 2023-10-06 LAB — BRAIN NATRIURETIC PEPTIDE: B Natriuretic Peptide: 123.9 pg/mL — ABNORMAL HIGH (ref 0.0–100.0)

## 2023-10-06 MED ORDER — FUROSEMIDE 40 MG PO TABS: 40.0000 mg | ORAL_TABLET | Freq: Every day | ORAL | 0 refills | Status: DC

## 2023-10-06 MED ORDER — FUROSEMIDE 10 MG/ML IJ SOLN
20.0000 mg | Freq: Once | INTRAMUSCULAR | Status: AC
  Administered 2023-10-06: 20 mg via INTRAVENOUS
  Filled 2023-10-06: qty 2

## 2023-10-06 NOTE — ED Notes (Signed)
 Pt Aox4 and ambulating off dept with her rolator and a steady gait

## 2023-10-06 NOTE — ED Notes (Signed)
 Pt given food and beverage; okay per Dr. Adela Lank

## 2023-10-06 NOTE — ED Provider Notes (Signed)
 [place] EMERGENCY DEPARTMENT AT [facility] Provider Note   CSN: [id number] Arrival date & time: 10/06/23  1146     Patient presents with: Shortness of Breath and Chest Pain   Tracy Fitzgerald is a 58 y.o. female.   58 yo F with a cc of chest pain.  Off and on for a few days.  Patient says she's had heart failure in the past and was told that it was due to her morbid obesity.  She says she lost to 150 pounds and her symptoms went away.  She said unfortunately she has been depressed recently and is gained about 100 of those pounds back.  She is short of breath on minimal exertion.  She feels like her abdomen is really swollen.  Denies any significant leg swelling.  She said the last time this happened her legs then became very swollen and so she is trying to get it treated before that happens.   Shortness of Breath Associated symptoms: chest pain   Chest Pain Associated symptoms: shortness of breath        Prior to Admission medications   Medication Sig Start Date End Date Taking? Authorizing Provider  furosemide  (LASIX ) 40 MG tablet Take 1 tablet (40 mg total) by mouth daily. 10/06/23  Yes Emil Share, DO  Accu-Chek Softclix Lancets lancets Test blood sugar once a day 11/22/22   Vicci Barnie NOVAK, MD  ARIPiprazole  (ABILIFY ) 5 MG tablet Take 1 tablet (5 mg total) by mouth daily. 11/22/22   Vicci Barnie NOVAK, MD  aspirin  EC 81 MG tablet Take 1 tablet (81 mg total) by mouth daily. Swallow whole. 12/31/19   Vicci Barnie NOVAK, MD  atorvastatin  (LIPITOR) 10 MG tablet Take 1 tablet (10 mg total) by mouth daily. 08/02/23   Vicci Barnie NOVAK, MD  Blood Glucose Monitoring Suppl (ACCU-CHEK GUIDE) w/Device KIT Test blood sugar once a day 11/22/22   Vicci Barnie NOVAK, MD  Blood Glucose Monitoring Suppl (TRUE METRIX METER) w/Device KIT Use as directed 10/12/16   Vicci Barnie NOVAK, MD  buPROPion  (WELLBUTRIN  SR) 150 MG 12 hr tablet Take 1 tablet (150 mg total) by mouth 2 (two) times daily.  11/22/22   Vicci Barnie NOVAK, MD  busPIRone  (BUSPAR ) 10 MG tablet TAKE 1 TABLET BY MOUTH THREE TIMES A DAY 08/09/23   Vicci Barnie NOVAK, MD  Cholecalciferol (VITAMIN D  PO) Take 1 tablet by mouth daily.     [provider]  furosemide  (LASIX ) 20 MG tablet Take 1 tablet by mouth daily for 5 days then as needed for edema 06/08/23   Danton Slough M, PA-C  gabapentin  (NEURONTIN ) 300 MG capsule Take 2 capsules (600 mg total) by mouth 3 (three) times daily AND 3 capsules (900 mg total) at bedtime. 06/08/23   Danton Slough HERO, PA-C  glucose blood (ACCU-CHEK GUIDE) test strip Test blood sugar once a day 11/22/22   Vicci Barnie NOVAK, MD  lidocaine  (LIDODERM ) 5 % Place 1 patch onto the skin daily. Apply to the lower back daily and leave on for 12 hours. 11/22/22   Vicci Barnie NOVAK, MD  lisinopril  (ZESTRIL ) 5 MG tablet Take 1 tablet (5 mg total) by mouth daily. 06/08/23   Danton Slough HERO, PA-C  melatonin 3 MG TABS tablet Take 1 tablet (3 mg total) by mouth at bedtime. 02/18/20   Harl Zane BRAVO, NP  meloxicam  (MOBIC ) 15 MG tablet Take 1 tablet (15 mg total) by mouth daily as needed for pain 06/08/23  Danton Slough M, PA-C  metFORMIN  (GLUCOPHAGE -XR) 500 MG 24 hr tablet Take 1 tablet (500 mg total) by mouth 2 (two) times daily with a meal. 08/02/23   Vicci Barnie NOVAK, MD  sertraline  (ZOLOFT ) 100 MG tablet Take 2 tablets (200 mg total) by mouth daily. 11/22/22   Vicci Barnie NOVAK, MD  VENTOLIN  HFA 108 [phone] Base) MCG/ACT inhaler INHALE 2 PUFFS INTO THE LUNGS EVERY 4 HOURS AS NEEDED FOR WHEEZING OR SHORTNESS OF BREATH 10/04/23   Vicci Barnie NOVAK, MD    Allergies: Cymbalta  [duloxetine  hcl]    Review of Systems  Respiratory:  Positive for shortness of breath.   Cardiovascular:  Positive for chest pain.    Updated Vital Signs BP 105/74   Pulse 94   Temp 98.2 F (36.8 C)   Resp 17   Ht 5' 6 (1.676 m)   Wt 125.6 kg   LMP 11/23/2015   SpO2 97%   BMI 44.71 kg/m   Physical Exam Vitals and  nursing note reviewed.  Constitutional:      General: She is not in acute distress.    Appearance: She is well-developed. She is not diaphoretic.     Comments: BMI 45  HENT:     Head: Normocephalic and atraumatic.  Eyes:     Pupils: Pupils are equal, round, and reactive to light.  Cardiovascular:     Rate and Rhythm: Normal rate and regular rhythm.     Heart sounds: No murmur heard.    No friction rub. No gallop.  Pulmonary:     Effort: Pulmonary effort is normal.     Breath sounds: No wheezing or rales.  Abdominal:     General: There is no distension.     Palpations: Abdomen is soft.     Tenderness: There is no abdominal tenderness.  Musculoskeletal:        General: No tenderness.     Cervical back: Normal range of motion and neck supple.  Skin:    General: Skin is warm and dry.  Neurological:     Mental Status: She is alert and oriented to person, place, and time.  Psychiatric:        Behavior: Behavior normal.     (all labs ordered are listed, but only abnormal results are displayed) Labs Reviewed  BASIC METABOLIC PANEL WITH GFR - Abnormal; Notable for the following components:      Result Value   Glucose, Bld 179 (*)    Creatinine, Ser 1.24 (*)    GFR, Estimated 50 (*)    All other components within normal limits  CBC - Abnormal; Notable for the following components:   WBC 13.5 (*)    Hemoglobin 16.0 (*)    HCT 48.5 (*)    All other components within normal limits  BRAIN NATRIURETIC PEPTIDE - Abnormal; Notable for the following components:   B Natriuretic Peptide 123.9 (*)    All other components within normal limits  TROPONIN I (HIGH SENSITIVITY) - Abnormal; Notable for the following components:   Troponin I (High Sensitivity) 20 (*)    All other components within normal limits  MAGNESIUM    EKG: EKG Interpretation Date/Time:  Thursday October 06 2023 11:54:34 EDT Ventricular Rate:  100 PR Interval:  170 QRS Duration:  90 QT Interval:  350 QTC  Calculation: 451 R Axis:   -46  Text Interpretation: Sinus rhythm with Premature atrial complexes Left axis deviation Pulmonary disease pattern Abnormal ECG No significant change since last tracing  Confirmed by Emil Share [phone]) on 10/06/2023 1:16:20 PM  Radiology: DG Chest 2 View Result Date: 10/06/2023 CLINICAL DATA:  Shortness of breath and chest pain. EXAM: CHEST - 2 VIEW COMPARISON:  Chest radiograph 06/17/2014. FINDINGS: Slightly low lung volumes. Mild elevation of the left hemidiaphragm. Prominence of the central pulmonary vasculature with bilateral interstitial opacities concerning for pulmonary edema. Additional linear opacities in the lung bases suggestive of atelectasis. No pleural effusion or pneumothorax. Cardiomediastinal silhouette within normal limits. Visualized osseous structures are unremarkable. IMPRESSION: Mild-to-moderate pulmonary edema. Bibasilar atelectasis. Electronically Signed   By: Donnice Mania M.D.   On: 10/06/2023 12:39     Procedures   Medications Ordered in the ED  furosemide  (LASIX ) injection 20 mg (has no administration in time range)                                    Medical Decision Making Amount and/or Complexity of Data Reviewed Labs: ordered. Radiology: ordered.   54 yOF with a chief complaints of difficulty breathing on exertion.  She tells me that she is lost 150 pounds but then gained 100 of them back.  Lungs are clear for me on exam.  Chest x-ray independently interpreted by me without focal infiltrate or pneumothorax.  There is increased vascularity concerning for edema.  BNP in an indeterminate range.  No acute anemia.  Mild bump in renal function.  Will give a bolus dose of Lasix  here.  Will double Lasix  for home.  PCP follow-up.  1:26 PM:  I have discussed the diagnosis/risks/treatment options with the patient and family.  Evaluation and diagnostic testing in the emergency department does not suggest an emergent condition requiring  admission or immediate intervention beyond what has been performed at this time.  They will follow up with PCP, cards. We also discussed returning to the ED immediately if new or worsening sx occur. We discussed the sx which are most concerning (e.g., sudden worsening pain, fever, inability to tolerate by mouth, worsening sob) that necessitate immediate return. Medications administered to the patient during their visit and any new prescriptions provided to the patient are listed below.  Medications given during this visit Medications  furosemide  (LASIX ) injection 20 mg (has no administration in time range)     The patient appears reasonably screen and/or stabilized for discharge and I doubt any other medical condition or other [facility] requiring further screening, evaluation, or treatment in the ED at this time prior to discharge.       Final diagnoses:  Acute on chronic diastolic congestive heart failure [facility])    ED Discharge Orders          Ordered    furosemide  (LASIX ) 40 MG tablet  Daily        10/06/23 1326    Ambulatory referral to Cardiology       Comments: If you have not heard from the Cardiology office within the next 72 hours please call [phone].   10/06/23 1326               Emil Share, DO 10/06/23 1326

## 2023-10-06 NOTE — ED Triage Notes (Signed)
 Patient presents to triage with CC of SOB and chest pain. This has been ongoing for 2 weeks. The patients endorse dyspnea and states she feels like she has fluid in her lungs/chest. The patient takes diuretics and has not missed any doses. The patient has a history of CHF.

## 2023-10-06 NOTE — Discharge Instructions (Signed)
 Double your fluid pills for the next few days.  Please follow-up with your family doctor in the office please return for worsening difficulty breathing or if you pass out or feel you are going to pass out.

## 2023-10-12 ENCOUNTER — Encounter: Admitting: Family Medicine

## 2023-10-12 ENCOUNTER — Encounter: Payer: Self-pay | Admitting: Internal Medicine

## 2023-10-12 ENCOUNTER — Encounter: Payer: Self-pay | Admitting: Family Medicine

## 2023-10-12 NOTE — Progress Notes (Signed)
Patient did not keep appointment today. She will be called to reschedule.  

## 2023-10-18 ENCOUNTER — Other Ambulatory Visit (HOSPITAL_COMMUNITY)

## 2023-10-24 ENCOUNTER — Ambulatory Visit: Attending: Cardiology | Admitting: Cardiology

## 2023-10-24 ENCOUNTER — Other Ambulatory Visit (HOSPITAL_COMMUNITY): Payer: Self-pay

## 2023-10-24 ENCOUNTER — Encounter: Payer: Self-pay | Admitting: Cardiology

## 2023-10-24 VITALS — BP 134/82 | HR 99 | Ht 66.0 in | Wt 273.5 lb

## 2023-10-24 DIAGNOSIS — I10 Essential (primary) hypertension: Secondary | ICD-10-CM | POA: Diagnosis not present

## 2023-10-24 DIAGNOSIS — R072 Precordial pain: Secondary | ICD-10-CM | POA: Insufficient documentation

## 2023-10-24 DIAGNOSIS — R0609 Other forms of dyspnea: Secondary | ICD-10-CM | POA: Insufficient documentation

## 2023-10-24 MED ORDER — FUROSEMIDE 40 MG PO TABS
40.0000 mg | ORAL_TABLET | Freq: Two times a day (BID) | ORAL | 1 refills | Status: DC
  Filled 2023-10-24 – 2023-10-31 (×2): qty 60, 30d supply, fill #0

## 2023-10-24 MED ORDER — FUROSEMIDE 40 MG PO TABS
40.0000 mg | ORAL_TABLET | Freq: Two times a day (BID) | ORAL | 1 refills | Status: DC
  Filled 2023-10-24 (×3): qty 60, 30d supply, fill #0

## 2023-10-24 NOTE — Patient Instructions (Signed)
 Medication Instructions:  START Lasix  40 mg twice daily   *If you need a refill on your cardiac medications before your next appointment, please call your pharmacy*  Testing/Procedures: LEXISCAN   Your physician has requested that you have a lexiscan  myoview . For further information please visit [email or web address] Please follow instruction sheet, as given.   ECHO  Your physician has requested that you have an echocardiogram. Echocardiography is a painless test that uses sound waves to create images of your heart. It provides your doctor with information about the size and shape of your heart and how well your heart's chambers and valves are working. This procedure takes approximately one hour. There are no restrictions for this procedure. Please do NOT wear cologne, perfume, aftershave, or lotions (deodorant is allowed). Please arrive 15 minutes prior to your appointment time.  Please note: We ask at that you not bring children with you during ultrasound (echo/ vascular) testing. Due to room size and safety concerns, children are not allowed in the ultrasound rooms during exams. Our front office staff cannot provide observation of children in our lobby area while testing is being conducted. An adult accompanying a patient to their appointment will only be allowed in the ultrasound room at the discretion of the ultrasound technician under special circumstances. We apologize for any inconvenience.   Follow-Up: At [facility], you and your health needs are our priority.  As part of our continuing mission to provide you with exceptional heart care, our providers are all part of one team.  This team includes your primary Cardiologist (physician) and Advanced Practice Providers or APPs (Physician Assistants and Nurse Practitioners) who all work together to provide you with the care you need, when you need it.  Your next appointment:   4-6 weeks   Provider:   One of our Advanced  Practice Providers (APPs): Morse Clause, PA-C  Lamarr Satterfield, NP Miriam Shams, NP  Olivia Pavy, PA-C Josefa Beauvais, NP  Leontine Salen, PA-C Orren Fabry, PA-C  [place], [place] Jackee Alberts, NP  Damien Braver, NP Jon Hails, PA-C  Waddell Donath, PA-C    Dayna Dunn, PA-C  Glendia Ferrier, PA-C Lum Louis, NP Katlyn West, NP Callie Goodrich, PA-C  Evan Williams, PA-C Sheng Haley, PA-C  Xika Zhao, NP Kathleen Johnson, PA-C

## 2023-10-24 NOTE — Addendum Note (Signed)
 Addended by: MANDA BOTTCHER B on: 10/24/2023 06:17 PM   Modules accepted: Orders

## 2023-10-24 NOTE — Progress Notes (Signed)
 Cardiology Office Note:  .   Date:  10/24/2023  ID:  Tracy Fitzgerald, DOB 1965-05-17, MRN [id number] PCP: Vicci Barnie NOVAK, MD  [facility] Providers Cardiologist:  Newman Lawrence, MD PCP: Vicci Barnie NOVAK, MD  Chief Complaint  Patient presents with   Establish Care   Exertional dyspnea     Tracy Fitzgerald is a 58 y.o. female with hypertension, type 2 diabetes mellitus, nicotine  dependence, exertional dyspnea, chest pain History of Present Illness Tracy Fitzgerald is a 58 year old female with hypertension and diabetes who presents with shortness of breath.  Patient is currently homeless, but states that she has a safe place.  She experiences shortness of breath for the past month, significantly impacting her mobility, particularly with exertion such as walking. She also has sharp, focal chest pain lasting about half an hour, not typically associated with exertion. No leg swelling is present, but she feels fluid in her chest and abdomen. She takes furosemide  40 mg once daily and lisinopril  20 mg as needed.  She smokes about a pack of cigarettes a day and has attempted to quit using Wellbutrin , which she has recently restarted. Her family history includes a brother who died during a cardiac catheterization after recovering from a bleeding ulcer and pneumonia.      Vitals:   10/24/23 0954  Pulse: 99  SpO2: 94%      Review of Systems  Cardiovascular:  Positive for chest pain and dyspnea on exertion. Negative for leg swelling, palpitations and syncope.  Musculoskeletal:  Positive for back pain.        Studies Reviewed: Tracy Fitzgerald        EKG 10/24/2023: Normal sinus rhythm Left axis deviation Possible Anterior infarct , age undetermined When compared with ECG of 06-Oct-2023 11:54, Premature atrial complexes are no longer Present    Labs 09/2023: HbA1C 7.6% Hb 16 Cr 1.24, eGFR 50 BNP 123 Trop HS 20, 18  11/2022: Chol 192, TG 308, HDL 61, LDL 81   Physical  Exam Vitals and nursing note reviewed.  Constitutional:      General: She is not in acute distress. Neck:     Vascular: No JVD.  Cardiovascular:     Rate and Rhythm: Normal rate and regular rhythm.     Heart sounds: Normal heart sounds. No murmur heard. Pulmonary:     Effort: Pulmonary effort is normal.     Breath sounds: Normal breath sounds. No wheezing or rales.  Musculoskeletal:     Right lower leg: No edema.     Left lower leg: No edema.      VISIT DIAGNOSES:   ICD-10-CM   1. Exertional dyspnea  R06.09 ECHOCARDIOGRAM COMPLETE    Myocardial Perfusion Imaging    furosemide  (LASIX ) 40 MG tablet    2. Essential hypertension  I10 EKG 12-Lead    3. Precordial pain  R07.2 ECHOCARDIOGRAM COMPLETE    Myocardial Perfusion Imaging       Tracy Fitzgerald is a 58 y.o. female with hypertension, type 2 diabetes mellitus, nicotine  dependence, exertional dyspnea, chest pain Assessment & Plan Exertional dyspnea and suspected congestive heart failure: Exertional dyspnea for one month, fluid sensation in chest and abdomen, BNP slightly elevated. Lasix  provided symptom relief, suggesting possible congestive heart failure. Differential includes diastolic dysfunction due to hypertension, diabetes, obesity, and potential COPD from smoking. Murmur detected. - Order echocardiogram and chemical stress test to evaluate cardiac function. - Increase Lasix  to 40 mg twice a day and send prescription to pharmacy. -  Encourage smoking cessation and continue Wellbutrin .  Hypertension: Hypertension managed with lisinopril . Potential contribution to diastolic dysfunction and suspected congestive heart failure.  Type 2 diabetes mellitus without complications: Type 2 diabetes mellitus may contribute to diastolic dysfunction and suspected congestive heart failure.  Nicotine  dependence, cigarettes: Nicotine  dependence with smoking approximately one pack per day. Smoking may contribute to shortness of breath  and potential COPD. - Encourage smoking cessation and continue Wellbutrin .    Informed Consent   Shared Decision Making/Informed Consent The risks [chest pain, shortness of breath, cardiac arrhythmias, dizziness, blood pressure fluctuations, myocardial infarction, stroke/transient ischemic attack, nausea, vomiting, allergic reaction, radiation exposure, metallic taste sensation and life-threatening complications (estimated to be 1 in 10,000)], benefits (risk stratification, diagnosing coronary artery disease, treatment guidance) and alternatives of a nuclear stress test were discussed in detail with Ms. Engert and she agrees to proceed.       Meds ordered this encounter  Medications   furosemide  (LASIX ) 40 MG tablet    Sig: Take 1 tablet (40 mg total) by mouth 2 (two) times daily. Take 1 tablet by mouth daily for 5 days then as needed for edema    Dispense:  60 tablet    Refill:  1     F/u in 4-6 weeks Signed, Newman JINNY Lawrence, MD

## 2023-10-25 NOTE — Telephone Encounter (Signed)
 Attempted to call patient and alternative contact. No answer and no way to leave message.

## 2023-10-26 ENCOUNTER — Telehealth (HOSPITAL_COMMUNITY): Payer: Self-pay | Admitting: *Deleted

## 2023-10-26 ENCOUNTER — Encounter (HOSPITAL_COMMUNITY): Payer: Self-pay | Admitting: *Deleted

## 2023-10-26 NOTE — Telephone Encounter (Signed)
 MPI instructions sent via [employer].

## 2023-10-31 ENCOUNTER — Other Ambulatory Visit (HOSPITAL_COMMUNITY): Payer: Self-pay

## 2023-10-31 ENCOUNTER — Telehealth: Payer: Self-pay

## 2023-10-31 DIAGNOSIS — E1159 Type 2 diabetes mellitus with other circulatory complications: Secondary | ICD-10-CM | POA: Diagnosis not present

## 2023-10-31 DIAGNOSIS — N3946 Mixed incontinence: Secondary | ICD-10-CM | POA: Diagnosis not present

## 2023-10-31 NOTE — Patient Outreach (Signed)
 Care Coordination   10/31/2023 Name: Tracy Fitzgerald MRN: [id number] DOB: December 29, 1965   Care Coordination Outreach Attempts:  An unsuccessful outreach was attempted for an appointment today.  Follow Up Plan:  Additional outreach attempts will be made to complete CCM follow-up visit.   Encounter Outcome:  No Answer, no option to leave voicemail.   Rosaline Finlay, RN MSN [place]  [facility] Population Health RN Care Manager Direct Dial: [phone]  Fax: [phone]

## 2023-11-01 ENCOUNTER — Other Ambulatory Visit: Payer: Self-pay | Admitting: Cardiology

## 2023-11-01 ENCOUNTER — Other Ambulatory Visit (HOSPITAL_COMMUNITY)
Admission: RE | Admit: 2023-11-01 | Discharge: 2023-11-01 | Disposition: A | Discharge status: Home / Self Care | Payer: Self-pay | Source: Ambulatory Visit | Attending: Medical Genetics | Admitting: Medical Genetics

## 2023-11-01 DIAGNOSIS — R072 Precordial pain: Secondary | ICD-10-CM

## 2023-11-01 DIAGNOSIS — R0609 Other forms of dyspnea: Secondary | ICD-10-CM

## 2023-11-03 ENCOUNTER — Ambulatory Visit (HOSPITAL_COMMUNITY)
Admission: RE | Admit: 2023-11-03 | Discharge: 2023-11-03 | Disposition: A | Discharge status: Home / Self Care | Source: Ambulatory Visit | Attending: Cardiology | Admitting: Cardiology

## 2023-11-03 ENCOUNTER — Encounter (HOSPITAL_COMMUNITY): Payer: Self-pay

## 2023-11-03 DIAGNOSIS — R0609 Other forms of dyspnea: Secondary | ICD-10-CM | POA: Diagnosis not present

## 2023-11-03 DIAGNOSIS — R072 Precordial pain: Secondary | ICD-10-CM | POA: Diagnosis not present

## 2023-11-03 LAB — MYOCARDIAL PERFUSION IMAGING
LV dias vol: 112 mL (ref 46–106)
LV sys vol: 47 mL (ref 3.8–5.2)
Nuc Stress EF: 58 %
Peak HR: 106 {beats}/min
Rest HR: 92 {beats}/min
Rest Nuclear Isotope Dose: 12.9 mCi
SDS: 0
SRS: 0
SSS: 0
ST Depression (mm): 0 mm
Stress Nuclear Isotope Dose: 37.8 mCi
TID: 1.03

## 2023-11-03 MED ORDER — REGADENOSON 0.4 MG/5ML IV SOLN
0.4000 mg | Freq: Once | INTRAVENOUS | Status: AC
  Administered 2023-11-03: 0.4 mg via INTRAVENOUS

## 2023-11-03 MED ORDER — TECHNETIUM TC 99M TETROFOSMIN IV KIT
12.9000 | PACK | Freq: Once | INTRAVENOUS | Status: AC | PRN
  Administered 2023-11-03: 12.9 via INTRAVENOUS

## 2023-11-03 MED ORDER — TECHNETIUM TC 99M TETROFOSMIN IV KIT
37.8000 | PACK | Freq: Once | INTRAVENOUS | Status: AC | PRN
  Administered 2023-11-03: 37.8 via INTRAVENOUS

## 2023-11-03 MED ORDER — REGADENOSON 0.4 MG/5ML IV SOLN
INTRAVENOUS | Status: AC
  Filled 2023-11-03: qty 5

## 2023-11-04 ENCOUNTER — Ambulatory Visit: Payer: Self-pay | Admitting: Cardiology

## 2023-11-04 NOTE — Patient Outreach (Signed)
 Care Coordination   11/04/2023 Name: Tracy Fitzgerald MRN: [id number] DOB: Aug 06, 1965   Care Coordination Outreach Attempts:  A second unsuccessful outreach was attempted today to complete CCM follow-up visit.  Follow Up Plan:  Additional outreach attempts will be made to complete follow-up visit.   Encounter Outcome:  No Answer, no option to leave voicemail.   Rosaline Finlay, RN MSN [place]  [facility]BC[facility] Population Health RN Care Manager Direct Dial: [phone]  Fax: [phone]

## 2023-11-04 NOTE — Progress Notes (Signed)
 Attempted calling pt, unable to leave message. Will send [facility] message.

## 2023-11-04 NOTE — Progress Notes (Signed)
 Calcium  noted in coronary arteries, but circulation to the heart muscle is normal.  Reasonable to continue aspirin  81 mg daily.  Increase Lipitor to 40 mg daily, check lipid panel in 3 months.  If symptoms of shortness of breath are not explained by upcoming echocardiogram, and if they persist, he may still need to evaluate further if coronary artery narrowing could be causing the symptoms. Recommend follow-up in 3 months with me.  Thanks MJP

## 2023-11-08 ENCOUNTER — Other Ambulatory Visit: Payer: Self-pay | Admitting: Internal Medicine

## 2023-11-08 ENCOUNTER — Other Ambulatory Visit (HOSPITAL_COMMUNITY): Payer: Self-pay

## 2023-11-08 NOTE — Patient Outreach (Signed)
 Care Coordination   11/08/2023 Name: Tracy Fitzgerald MRN: [id number] DOB: 06-08-1965   Care Coordination Outreach Attempts:  A third unsuccessful outreach was attempted today to complete CCM follow-up visit.  Follow Up Plan:  No further outreach attempts will be made at this time. We have been unable to contact the patient to complete follow-up visit. Patient has been disenrolled from CCM due to unable to reach x3 attempts.  Encounter Outcome:  No Answer, no option to leave voicemail.   Rosaline Finlay, RN MSN [place]  [facility] Population Health RN Care Manager Direct Dial: [phone]  Fax: [phone]

## 2023-11-09 NOTE — Progress Notes (Signed)
Unable to leave message/Will try again at later time.

## 2023-11-10 ENCOUNTER — Ambulatory Visit: Payer: Self-pay | Admitting: Cardiology

## 2023-11-10 ENCOUNTER — Other Ambulatory Visit (HOSPITAL_COMMUNITY): Payer: Self-pay

## 2023-11-11 LAB — GENECONNECT MOLECULAR SCREEN: Genetic Analysis Overall Interpretation: NEGATIVE

## 2023-11-16 ENCOUNTER — Telehealth: Payer: Self-pay | Admitting: Internal Medicine

## 2023-11-16 DIAGNOSIS — J439 Emphysema, unspecified: Secondary | ICD-10-CM

## 2023-11-16 NOTE — Telephone Encounter (Signed)
 Patient called in requesting refill for,  albuterol  (VENTOLIN  HFA) 108

## 2023-11-28 ENCOUNTER — Other Ambulatory Visit (HOSPITAL_COMMUNITY)

## 2023-11-28 ENCOUNTER — Telehealth (HOSPITAL_COMMUNITY): Payer: Self-pay | Admitting: Internal Medicine

## 2023-11-28 NOTE — Telephone Encounter (Signed)
 Patient has called twice today cancelling echo for this afternoon. Order will be removed from the echo WQ and we asked patient to call back when she can come due to NO SHOW Rate and following NO SHOW Policy.

## 2023-12-01 DIAGNOSIS — E1159 Type 2 diabetes mellitus with other circulatory complications: Secondary | ICD-10-CM | POA: Diagnosis not present

## 2023-12-01 DIAGNOSIS — N3946 Mixed incontinence: Secondary | ICD-10-CM | POA: Diagnosis not present

## 2023-12-06 ENCOUNTER — Ambulatory Visit: Attending: Internal Medicine | Admitting: Internal Medicine

## 2023-12-06 VITALS — BP 111/74 | HR 104 | Temp 98.0°F | Ht 66.0 in | Wt 270.0 lb

## 2023-12-06 DIAGNOSIS — I2510 Atherosclerotic heart disease of native coronary artery without angina pectoris: Secondary | ICD-10-CM

## 2023-12-06 DIAGNOSIS — Z7985 Long-term (current) use of injectable non-insulin antidiabetic drugs: Secondary | ICD-10-CM | POA: Diagnosis not present

## 2023-12-06 DIAGNOSIS — Z7984 Long term (current) use of oral hypoglycemic drugs: Secondary | ICD-10-CM | POA: Diagnosis not present

## 2023-12-06 DIAGNOSIS — Z9989 Dependence on other enabling machines and devices: Secondary | ICD-10-CM | POA: Diagnosis not present

## 2023-12-06 DIAGNOSIS — I2584 Coronary atherosclerosis due to calcified coronary lesion: Secondary | ICD-10-CM

## 2023-12-06 DIAGNOSIS — F331 Major depressive disorder, recurrent, moderate: Secondary | ICD-10-CM

## 2023-12-06 DIAGNOSIS — F411 Generalized anxiety disorder: Secondary | ICD-10-CM | POA: Diagnosis not present

## 2023-12-06 DIAGNOSIS — G4733 Obstructive sleep apnea (adult) (pediatric): Secondary | ICD-10-CM | POA: Diagnosis not present

## 2023-12-06 DIAGNOSIS — F17200 Nicotine dependence, unspecified, uncomplicated: Secondary | ICD-10-CM | POA: Diagnosis not present

## 2023-12-06 DIAGNOSIS — I152 Hypertension secondary to endocrine disorders: Secondary | ICD-10-CM

## 2023-12-06 DIAGNOSIS — Z23 Encounter for immunization: Secondary | ICD-10-CM | POA: Diagnosis not present

## 2023-12-06 DIAGNOSIS — E1169 Type 2 diabetes mellitus with other specified complication: Secondary | ICD-10-CM

## 2023-12-06 DIAGNOSIS — E1142 Type 2 diabetes mellitus with diabetic polyneuropathy: Secondary | ICD-10-CM

## 2023-12-06 DIAGNOSIS — E785 Hyperlipidemia, unspecified: Secondary | ICD-10-CM | POA: Diagnosis not present

## 2023-12-06 DIAGNOSIS — I10 Essential (primary) hypertension: Secondary | ICD-10-CM | POA: Diagnosis not present

## 2023-12-06 DIAGNOSIS — R0681 Apnea, not elsewhere classified: Secondary | ICD-10-CM

## 2023-12-06 DIAGNOSIS — E119 Type 2 diabetes mellitus without complications: Secondary | ICD-10-CM

## 2023-12-06 DIAGNOSIS — Z122 Encounter for screening for malignant neoplasm of respiratory organs: Secondary | ICD-10-CM

## 2023-12-06 LAB — POCT GLYCOSYLATED HEMOGLOBIN (HGB A1C): HbA1c, POC (controlled diabetic range): 9 % — AB (ref 0.0–7.0)

## 2023-12-06 LAB — GLUCOSE, POCT (MANUAL RESULT ENTRY): POC Glucose: 176 mg/dL — AB (ref 70–99)

## 2023-12-06 MED ORDER — ATORVASTATIN CALCIUM 40 MG PO TABS: 40.0000 mg | ORAL_TABLET | Freq: Every day | ORAL | 1 refills | Status: DC

## 2023-12-06 MED ORDER — ACCU-CHEK GUIDE W/DEVICE KIT: PACK | 0 refills | Status: AC

## 2023-12-06 MED ORDER — ARIPIPRAZOLE 5 MG PO TABS: 5.0000 mg | ORAL_TABLET | Freq: Every day | ORAL | 0 refills | Status: DC

## 2023-12-06 MED ORDER — SERTRALINE HCL 100 MG PO TABS: 200.0000 mg | ORAL_TABLET | Freq: Every day | ORAL | 1 refills | Status: DC

## 2023-12-06 MED ORDER — OZEMPIC (0.25 OR 0.5 MG/DOSE) 2 MG/3ML ~~LOC~~ SOPN: 0.2500 mg | PEN_INJECTOR | SUBCUTANEOUS | 0 refills | Status: DC

## 2023-12-06 MED ORDER — LISINOPRIL 5 MG PO TABS: 5.0000 mg | ORAL_TABLET | Freq: Every day | ORAL | 1 refills | Status: DC

## 2023-12-06 NOTE — Patient Instructions (Signed)
  VISIT SUMMARY: Today, we reviewed your chronic medical conditions, including diabetes, hypertension, and congestive heart failure. We discussed your recent ER visit for heart failure, your challenges with diabetes management, and your ongoing issues with depression and anxiety. We also addressed your smoking habits, sleep apnea, and the need for weight loss before hernia surgery.  YOUR PLAN: -TYPE 2 DIABETES MELLITUS WITH POOR GLYCEMIC CONTROL AND DIABETIC POLYNEUROPATHY: Your blood sugar levels are higher than desired, which can lead to nerve damage over time. We will continue your current medication, metformin , and add a new medication, Ozempic , to help control your blood sugar and assist with weight loss. You will also receive a new blood glucose monitoring kit, and the pharmacist will show you how to use Ozempic . Please monitor for any side effects and report severe symptoms.  -OBESITY: Excess body weight can contribute to various health issues. Ozempic , which we are prescribing for your diabetes, will also help with weight loss.  -ACUTE CONGESTIVE HEART FAILURE: This condition means your heart is not pumping blood as well as it should, causing symptoms like shortness of breath. Continue taking furosemide  as prescribed, and we will reschedule your echocardiogram to further evaluate your heart function.  -ESSENTIAL HYPERTENSION: Your blood pressure is well-controlled with your current medication, lisinopril . Continue taking it as prescribed and try to reduce your dietary sodium intake.  -HYPERLIPIDEMIA: This condition involves high levels of fats in your blood, which can lead to plaque buildup in your arteries. Increase your atorvastatin  dose to 40 mg daily as recommended by your cardiologist.  -MAJOR DEPRESSIVE DISORDER AND GENERALIZED ANXIETY DISORDER: These are mental health conditions that affect your mood and anxiety levels. Continue taking your current medications, and follow up with your  psychiatrist and therapist as scheduled.  -TOBACCO USE DISORDER: Smoking can harm your health in many ways. Continue taking bupropion  to help you quit smoking, and we encourage you to stop smoking altogether.  -OBSTRUCTIVE SLEEP APNEA WITHOUT CPAP: This condition causes breathing interruptions during sleep, leading to poor sleep quality. We will order a new sleep study to reassess your condition.  -UMBILICAL HERNIA: This is a condition where part of your intestine protrudes through an opening in your abdominal muscles. Weight loss is recommended before considering surgical intervention.  INSTRUCTIONS: Please follow up with the rescheduled echocardiogram and attend your upcoming appointments with the psychiatrist and therapist. Monitor your blood sugar levels with the new kit, and report any severe side effects from Ozempic . We will also arrange a new sleep study for your sleep apnea.                      Contains text generated by Abridge.                                 Contains text generated by Abridge.
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
## 2023-12-07 ENCOUNTER — Other Ambulatory Visit: Payer: Self-pay | Admitting: Internal Medicine

## 2023-12-07 ENCOUNTER — Telehealth: Payer: Self-pay | Admitting: Internal Medicine

## 2023-12-07 ENCOUNTER — Ambulatory Visit: Payer: Self-pay | Admitting: Internal Medicine

## 2023-12-07 ENCOUNTER — Encounter: Payer: Self-pay | Admitting: Internal Medicine

## 2023-12-07 ENCOUNTER — Other Ambulatory Visit: Payer: Self-pay

## 2023-12-07 DIAGNOSIS — F331 Major depressive disorder, recurrent, moderate: Secondary | ICD-10-CM

## 2023-12-07 LAB — LIPID PANEL
Chol/HDL Ratio: 3.5 ratio (ref 0.0–4.4)
Cholesterol, Total: 191 mg/dL (ref 100–199)
HDL: 55 mg/dL (ref >39)
LDL Chol Calc (NIH): 100 mg/dL — ABNORMAL HIGH (ref 0–99)
Triglycerides: 212 mg/dL — ABNORMAL HIGH (ref 0–149)
VLDL Cholesterol Cal: 36 mg/dL (ref 5–40)

## 2023-12-07 LAB — COMPREHENSIVE METABOLIC PANEL WITH GFR
ALT: 21 IU/L (ref 0–32)
AST: 16 IU/L (ref 0–40)
Albumin: 4.1 g/dL (ref 3.8–4.9)
Alkaline Phosphatase: 107 IU/L (ref 49–135)
BUN/Creatinine Ratio: 13 (ref 9–23)
BUN: 15 mg/dL (ref 6–24)
Bilirubin Total: 0.3 mg/dL (ref 0.0–1.2)
CO2: 25 mmol/L (ref 20–29)
Calcium: 10 mg/dL (ref 8.7–10.2)
Chloride: 94 mmol/L — ABNORMAL LOW (ref 96–106)
Creatinine, Ser: 1.15 mg/dL — ABNORMAL HIGH (ref 0.57–1.00)
Globulin, Total: 2.9 g/dL (ref 1.5–4.5)
Glucose: 152 mg/dL — ABNORMAL HIGH (ref 70–99)
Potassium: 3 mmol/L — ABNORMAL LOW (ref 3.5–5.2)
Sodium: 139 mmol/L (ref 134–144)
Total Protein: 7 g/dL (ref 6.0–8.5)
eGFR: 55 mL/min/1.73 — ABNORMAL LOW (ref >59)

## 2023-12-07 MED ORDER — POTASSIUM CHLORIDE CRYS ER 20 MEQ PO TBCR
20.0000 meq | EXTENDED_RELEASE_TABLET | Freq: Every day | ORAL | 1 refills | Status: DC
  Filled 2023-12-07: qty 90, 90d supply, fill #0

## 2023-12-07 NOTE — Telephone Encounter (Signed)
-----   Message from Barnie Louder sent at 12/07/2023 12:28 PM EDT ----- Regarding: appt Give 1 mth appt with [name] for DM f/u.

## 2023-12-07 NOTE — Telephone Encounter (Signed)
 Appointment scheduled for 01/09/2024 at 2:00 pm.

## 2023-12-09 ENCOUNTER — Ambulatory Visit: Admitting: Physician Assistant

## 2023-12-09 NOTE — Progress Notes (Deleted)
  Cardiology Office Note   Date:  12/09/2023  ID:  Tracy Fitzgerald, DOB Oct 10, 1965, MRN [id number] PCP: Vicci Barnie NOVAK, MD  [facility] Providers Cardiologist:  None   History of Present Illness Tracy Fitzgerald is a 58 y.o. female with a past medical history of hypertension, type 2 diabetes mellitus, nicotine  dependence, exertional dyspnea, and chest pain here for follow-up appointment.  Patient is unfortunately homeless but states that she does have a safe place.  Experiences shortness of breath for the past month prior to her last visit on 10/24/2023.  This significantly impacts her mobility particular with exertion such as walking.  She also has sharp focal chest pain lasting about half an hour not typically associated with exertion.  No leg swelling is present but she does feel fluid in her chest and abdomen.  Takes furosemide  40 mg once a day and lisinopril  20 mg as needed.  Smokes about a pack cigarettes a day and has attempted to quit using Wellbutrin  which she has recently restarted.  Family history includes brother who died during a cardiac catheterization after recovering from a bleeding ulcer pneumonia.  A stress test and echocardiogram were ordered at her last visit.  It does not look like her echocardiogram has been performed but her Lexiscan  Myoview  was low risk without any ST deviation and normal perfusion.  Today, she***  ROS: ***  Studies Reviewed     Lexiscan  Myoview  11/03/2023    The study is normal. The study is low risk.   No ST deviation was noted.   LV perfusion is normal. There is no evidence of ischemia. There is no evidence of infarction.   Left ventricular function is normal. Nuclear stress EF: 58%. The left ventricular ejection fraction is normal (55-65%). End diastolic cavity size is normal.   CT images were obtained for attenuation correction and were examined for the presence of coronary calcium  when appropriate.   Coronary calcium  was present on the  attenuation correction CT images. Moderate coronary calcifications were present. Coronary calcifications were present in the left anterior descending artery, left circumflex artery and right coronary artery distribution(s).   Prior study not available for comparison Risk Assessment/Calculations {Does this patient have ATRIAL FIBRILLATION?:[phone]} No BP recorded.  {Refresh Note OR Click here to enter BP  :1}***       Physical Exam VS:  LMP 11/23/2015        Wt Readings from Last 3 Encounters:  12/06/23 270 lb (122.5 kg)  10/24/23 273 lb 8 oz (124.1 kg)  10/06/23 277 lb (125.6 kg)    GEN: Well nourished, well developed in no acute distress NECK: No JVD; No carotid bruits CARDIAC: ***RRR, no murmurs, rubs, gallops RESPIRATORY:  Clear to auscultation without rales, wheezing or rhonchi  ABDOMEN: Soft, non-tender, non-distended EXTREMITIES:  No edema; No deformity   ASSESSMENT AND PLAN Exertional dyspnea with suspected CHF Hypertension Type 2 diabetes mellitus Nicotine  dependence, cigarettes    {Are you ordering a CV Procedure (e.g. stress test, cath, DCCV, TEE, etc)?   Press F2        :[id number]}  Dispo: ***  Signed, Orren LOISE Fabry, PA-C

## 2023-12-13 ENCOUNTER — Ambulatory Visit (HOSPITAL_COMMUNITY)
Admission: RE | Admit: 2023-12-13 | Discharge: 2023-12-13 | Disposition: A | Discharge status: Home / Self Care | Source: Ambulatory Visit | Attending: Cardiology | Admitting: Cardiology

## 2023-12-13 DIAGNOSIS — R072 Precordial pain: Secondary | ICD-10-CM | POA: Diagnosis not present

## 2023-12-13 DIAGNOSIS — R0609 Other forms of dyspnea: Secondary | ICD-10-CM | POA: Insufficient documentation

## 2023-12-13 LAB — ECHOCARDIOGRAM COMPLETE
AR max vel: 1.08 cm2
AV Area VTI: 1.01 cm2
AV Area mean vel: 0.91 cm2
AV Mean grad: 36 mmHg
AV Peak grad: 52.4 mmHg
Ao pk vel: 3.62 m/s
Area-P 1/2: 7.16 cm2
P 1/2 time: 549 ms
S' Lateral: 3.2 cm
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
## 2023-12-13 NOTE — Progress Notes (Signed)
 Echocardiogram inadequate for visualization of aortic valve, but appears to have at least moderate to severe aortic stenosis.  Will likely need further workup with TEE, possibly right and left heart catheterization.  Patient is seeing test on 01/11/2024.  This can be discussed at that visit, or reschedule appointment to sooner date with me or APP to further discuss the results and above recommendations.  Thanks MJP

## 2023-12-14 ENCOUNTER — Encounter (HOSPITAL_COMMUNITY): Payer: Self-pay | Admitting: Psychiatry

## 2023-12-14 ENCOUNTER — Telehealth (HOSPITAL_COMMUNITY): Payer: Self-pay

## 2023-12-14 ENCOUNTER — Ambulatory Visit (HOSPITAL_COMMUNITY): Admitting: Psychiatry

## 2023-12-14 DIAGNOSIS — F4310 Post-traumatic stress disorder, unspecified: Secondary | ICD-10-CM

## 2023-12-14 DIAGNOSIS — F331 Major depressive disorder, recurrent, moderate: Secondary | ICD-10-CM

## 2023-12-14 DIAGNOSIS — F329 Major depressive disorder, single episode, unspecified: Secondary | ICD-10-CM | POA: Diagnosis not present

## 2023-12-14 MED ORDER — ARIPIPRAZOLE 10 MG PO TABS: 10.0000 mg | ORAL_TABLET | Freq: Every day | ORAL | 2 refills | Status: DC

## 2023-12-14 NOTE — Patient Instructions (Signed)
 Thank you for attending your appointment today.  -- INCREASE Abilify  to 10 mg daily and consider moving to morning time if activating or interfering with sleep -- DECREASE Buspar  to 10 mg twice daily for 1 week then STOP -- Continue other medications as prescribed. -- Please ask to get your thyroid level checked at your upcoming PCP appointment  Please do not make any changes to medications without first discussing with your provider. If you are experiencing a psychiatric emergency, please call 911 or present to your nearest emergency department. Additional crisis, medication management, and therapy resources are included below.  [facility]  [address], [place], [place] [address] [phone] WALK-IN URGENT CARE 24/7 FOR ANYONE [address] [place], [place]  [phone] Fax: [phone] [email or web address] *Interpreters available *Accepts all insurance and uninsured for Urgent Care needs *Accepts Medicaid and uninsured for outpatient treatment (below)      ONLY FOR [facility]  Below:    Outpatient New Patient Assessment/Therapy Walk-ins:        Monday, Wednesday, and Thursday 8am until slots are full (first come, first served)                   New Patient Psychiatry/Medication Management        Monday-Friday 8am-11am (first come, first served)               For all walk-ins we ask that you arrive by 7:15am, because patients will be seen in the order of arrival.

## 2023-12-14 NOTE — Telephone Encounter (Signed)
 pharmacy was faxed and confirmed that prior auth was approved from 12-14-23 to 12-13-24

## 2023-12-14 NOTE — Telephone Encounter (Signed)
 received fax that a prior auth was needed for the aripiprazole  10mg .

## 2023-12-14 NOTE — Telephone Encounter (Signed)
 prior shara was approved from 12-14-23 to 12-13-24. pa case # [id number].

## 2023-12-14 NOTE — Telephone Encounter (Signed)
 prior Berkley Harvey was submitted and is pending

## 2023-12-15 ENCOUNTER — Other Ambulatory Visit: Payer: Self-pay

## 2023-12-15 NOTE — Progress Notes (Deleted)
  Cardiology Office Note   Date:  12/15/2023  ID:  Tracy Fitzgerald, DOB Sep 16, 1965, MRN [id number] PCP: Vicci Barnie NOVAK, MD  [facility] Providers Cardiologist:  None   History of Present Illness Tracy Fitzgerald is a 58 y.o. female with a past medical history of hypertension, type 2 diabetes mellitus, nicotine  dependence, exertional dyspnea, and chest pain here for follow-up appointment.  Patient is unfortunately homeless but states that she does have a safe place.  Experiences shortness of breath for the past month prior to her last visit on 10/24/2023.  This significantly impacts her mobility particular with exertion such as walking.  She also has sharp focal chest pain lasting about half an hour not typically associated with exertion.  No leg swelling is present but she does feel fluid in her chest and abdomen.  Takes furosemide  40 mg once a day and lisinopril  20 mg as needed.  Smokes about a pack cigarettes a day and has attempted to quit using Wellbutrin  which she has recently restarted.  Family history includes brother who died during a cardiac catheterization after recovering from a bleeding ulcer pneumonia.  A stress test and echocardiogram were ordered at her last visit.  It does not look like her echocardiogram has been performed but her Lexiscan  Myoview  was low risk without any ST deviation and normal perfusion.  Today, she***  ROS: ***  Studies Reviewed     Lexiscan  Myoview  11/03/2023    The study is normal. The study is low risk.   No ST deviation was noted.   LV perfusion is normal. There is no evidence of ischemia. There is no evidence of infarction.   Left ventricular function is normal. Nuclear stress EF: 58%. The left ventricular ejection fraction is normal (55-65%). End diastolic cavity size is normal.   CT images were obtained for attenuation correction and were examined for the presence of coronary calcium  when appropriate.   Coronary calcium  was present on the  attenuation correction CT images. Moderate coronary calcifications were present. Coronary calcifications were present in the left anterior descending artery, left circumflex artery and right coronary artery distribution(s).   Prior study not available for comparison Risk Assessment/Calculations {Does this patient have ATRIAL FIBRILLATION?[phone]} No BP recorded.  {Refresh Note OR Click here to enter BP  :1}***       Physical Exam VS:  LMP 11/23/2015        Wt Readings from Last 3 Encounters:  12/06/23 270 lb (122.5 kg)  10/24/23 273 lb 8 oz (124.1 kg)  10/06/23 277 lb (125.6 kg)    GEN: Well nourished, well developed in no acute distress NECK: No JVD; No carotid bruits CARDIAC: ***RRR, no murmurs, rubs, gallops RESPIRATORY:  Clear to auscultation without rales, wheezing or rhonchi  ABDOMEN: Soft, non-tender, non-distended EXTREMITIES:  No edema; No deformity   ASSESSMENT AND PLAN Exertional dyspnea with suspected CHF Hypertension Type 2 diabetes mellitus Nicotine  dependence, cigarettes    {Are you ordering a CV Procedure (e.g. stress test, cath, DCCV, TEE, etc)?   Press F2        :[id number]}  Dispo: ***  Signed, Orren LOISE Fabry, PA-C

## 2023-12-16 ENCOUNTER — Other Ambulatory Visit: Payer: Self-pay

## 2023-12-16 ENCOUNTER — Ambulatory Visit: Attending: Physician Assistant | Admitting: Physician Assistant

## 2023-12-16 DIAGNOSIS — I10 Essential (primary) hypertension: Secondary | ICD-10-CM

## 2023-12-16 DIAGNOSIS — R072 Precordial pain: Secondary | ICD-10-CM

## 2023-12-16 DIAGNOSIS — F17200 Nicotine dependence, unspecified, uncomplicated: Secondary | ICD-10-CM

## 2023-12-16 DIAGNOSIS — R0609 Other forms of dyspnea: Secondary | ICD-10-CM

## 2023-12-19 ENCOUNTER — Encounter: Payer: Self-pay | Admitting: Physician Assistant

## 2023-12-20 ENCOUNTER — Ambulatory Visit: Attending: Internal Medicine | Admitting: Internal Medicine

## 2023-12-20 VITALS — BP 129/90 | HR 103 | Wt 268.0 lb

## 2023-12-20 DIAGNOSIS — R0609 Other forms of dyspnea: Secondary | ICD-10-CM | POA: Diagnosis not present

## 2023-12-20 DIAGNOSIS — Z7985 Long-term (current) use of injectable non-insulin antidiabetic drugs: Secondary | ICD-10-CM

## 2023-12-20 DIAGNOSIS — Z7984 Long term (current) use of oral hypoglycemic drugs: Secondary | ICD-10-CM

## 2023-12-20 DIAGNOSIS — G8929 Other chronic pain: Secondary | ICD-10-CM | POA: Diagnosis not present

## 2023-12-20 DIAGNOSIS — M5441 Lumbago with sciatica, right side: Secondary | ICD-10-CM | POA: Diagnosis not present

## 2023-12-20 DIAGNOSIS — R0602 Shortness of breath: Secondary | ICD-10-CM

## 2023-12-20 DIAGNOSIS — M542 Cervicalgia: Secondary | ICD-10-CM

## 2023-12-20 DIAGNOSIS — R269 Unspecified abnormalities of gait and mobility: Secondary | ICD-10-CM | POA: Diagnosis not present

## 2023-12-20 DIAGNOSIS — Z5901 Sheltered homelessness: Secondary | ICD-10-CM

## 2023-12-20 DIAGNOSIS — M25551 Pain in right hip: Secondary | ICD-10-CM

## 2023-12-20 DIAGNOSIS — Z9181 History of falling: Secondary | ICD-10-CM

## 2023-12-20 DIAGNOSIS — J449 Chronic obstructive pulmonary disease, unspecified: Secondary | ICD-10-CM

## 2023-12-20 DIAGNOSIS — F332 Major depressive disorder, recurrent severe without psychotic features: Secondary | ICD-10-CM

## 2023-12-20 MED ORDER — MOMETASONE FURO-FORMOTEROL FUM 100-5 MCG/ACT IN AERO: 2.0000 | INHALATION_SPRAY | Freq: Two times a day (BID) | RESPIRATORY_TRACT | 1 refills | Status: DC

## 2023-12-20 NOTE — Patient Instructions (Addendum)
  VISIT SUMMARY: Today, we discussed your chronic pain, mobility issues, diabetic neuropathy, aortic stenosis, and asthma. We reviewed your current medications and made plans for further imaging and referrals to help manage your conditions.  YOUR PLAN: -CHRONIC LOW BACK PAIN WITH RIGHT HIP AND RIGHT LOWER EXTREMITY INVOLVEMENT: Chronic low back pain with radiation to the right hip and lower extremity can be due to conditions like a slipped disc or spinal stenosis. We will order an MRI of your lumbar spine and X-rays of your right hip, cervical, and thoracic spine to get a clearer picture. Depending on the results, we may refer you to pain management or a neurosurgeon.  -MOBILITY ISSUES AND NEED FOR MOTORIZED CHAIR: Your severe pain and mobility limitations make daily activities difficult. We will refer you to physical therapy for a mobility aid evaluation and discuss the approval process for a motorized chair with your insurance.  -TYPE 2 DIABETES MELLITUS WITH DIABETIC NEUROPATHY, GAIT INSTABILITY, AND FALLS: Diabetic neuropathy causes numbness and instability, leading to falls. We will refer you to physical therapy for a mobility aid evaluation to help improve your stability and safety.  -AORTIC STENOSIS WITH EXERTIONAL DYSPNEA: Aortic stenosis is a narrowing of the heart's aortic valve, which can cause shortness of breath during physical activity. Please ensure you follow up with your cardiologist this month.  -COPD, ON INHALER THERAPY: COPD is a condition that affects your airways and can cause breathing difficulties. We will prescribe Dulera inhaler to use twice daily. Remember to rinse your mouth after using the inhaler to prevent any side effects.  INSTRUCTIONS: Please follow up with your cardiologist this month for your aortic stenosis. Additionally, attend the physical therapy sessions for mobility aid evaluation and use the new inhaler as  prescribed.                      Contains text generated by Abridge.                                 Contains text generated by Abridge.
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
## 2023-12-21 ENCOUNTER — Encounter: Payer: Self-pay | Admitting: Internal Medicine

## 2023-12-21 ENCOUNTER — Other Ambulatory Visit: Payer: Self-pay | Admitting: Cardiology

## 2023-12-21 ENCOUNTER — Other Ambulatory Visit: Payer: Self-pay | Admitting: Internal Medicine

## 2023-12-21 DIAGNOSIS — E1142 Type 2 diabetes mellitus with diabetic polyneuropathy: Secondary | ICD-10-CM

## 2023-12-21 DIAGNOSIS — R0609 Other forms of dyspnea: Secondary | ICD-10-CM

## 2023-12-21 DIAGNOSIS — G8929 Other chronic pain: Secondary | ICD-10-CM

## 2023-12-28 ENCOUNTER — Telehealth: Payer: Self-pay | Admitting: Internal Medicine

## 2023-12-28 NOTE — Telephone Encounter (Signed)
Called patient, patient did not answer. Unable to leave voice mail.

## 2023-12-28 NOTE — Progress Notes (Unsigned)
 Cardiology Office Note   Date:  01/04/2024  ID:  Bernadean Saling, DOB 1965-09-11, MRN [id number] PCP: Vicci Barnie NOVAK, MD  [facility] Providers Cardiologist:  Newman JINNY Lawrence, MD Cardiology APP:  Carlin Delon BROCKS, NP     History of Present Illness Tracy Fitzgerald is a 58 y.o. female with a past medical history of hypertension, aortic stenosis, LAFB, coronary artery calcification noted on CT imaging, DM2, tobacco abuse, homelessness.  12/13/2023 echo concerns for moderate to severe aortic stenosis, V max 3.6 m/s, 36 mmHG, AVA 1.01 cm2, DI 0.24, recommendations for TEE 11/03/2023 Lexiscan  normal, low risk but coronary artery calcification noted  She established care with Dr. Lawrence on 10/24/2023 for the evaluation of shortness of breath, currently experiencing homelessness but was in a safe place.  MPI was arranged which was abnormal, low restudy however moderate coronary artery calcifications was noted.  An echocardiogram was arranged revealing a EF of 65 to 70%, trivial MR, aortic valve was not well-visualized however concerns for moderate to severe aortic stenosis, V max 3.6 m/s, 36 mmHG, AVA 1.01 cm2, DI 0.24.  She presents today for follow-up, has been feeling better since she was started on Lasix  but she still is symptomatic.  We discussed findings on her echocardiogram, she mentions that her brother had a heart valve issue that subsequently caused his demise however she is not sure what this is.  She has had a very rough past year, her daughter was killed in a motor vehicle accident, she subsequently cope with that by eating and has been working to lose weight.  She follows yearly closely with her PCP and has a great relationship with her.  She is homeless, lives in a tent with her husband, relies on public transit.  Possibly working with someone from her PCPs office, mention she is on some type of list to receive some housing assistance but has been over a year.  She does have  episodes of shortness of breath along with dizziness. She denies chest pain, palpitations, pnd, orthopnea, n, v,  syncope, edema, weight gain, or early satiety.   ROS: Review of Systems  Respiratory:  Positive for shortness of breath.   Neurological:  Positive for dizziness.  All other systems reviewed and are negative.    Studies Reviewed EKG Interpretation Date/Time:  Wednesday January 04 2024 13:59:44 EDT Ventricular Rate:  105 PR Interval:  188 QRS Duration:  94 QT Interval:  336 QTC Calculation: 444 R Axis:   -56  Text Interpretation: Sinus tachycardia Left anterior fascicular block Cannot rule out Anterior infarct (cited on or before 24-Oct-2023) When compared with ECG of 24-Oct-2023 09:54, No significant change was found Confirmed by Carlin Delon [phone]) on 01/04/2024 2:02:48 PM    Cardiac Studies & Procedures   ______________________________________________________________________________________________   STRESS TESTS  MYOCARDIAL PERFUSION IMAGING 11/03/2023  Interpretation Summary   The study is normal. The study is low risk.   No ST deviation was noted.   LV perfusion is normal. There is no evidence of ischemia. There is no evidence of infarction.   Left ventricular function is normal. Nuclear stress EF: 58%. The left ventricular ejection fraction is normal (55-65%). End diastolic cavity size is normal.   CT images were obtained for attenuation correction and were examined for the presence of coronary calcium  when appropriate.   Coronary calcium  was present on the attenuation correction CT images. Moderate coronary calcifications were present. Coronary calcifications were present in the left anterior descending artery, left circumflex  artery and right coronary artery distribution(s).   Prior study not available for comparison.   ECHOCARDIOGRAM  ECHOCARDIOGRAM COMPLETE 12/13/2023  Narrative ECHOCARDIOGRAM REPORT    Patient Name:   Tracy Fitzgerald Date of Exam:  12/13/2023 Medical Rec #:  [id number]     Height:       66.0 in Accession #:    [id number]    Weight:       270.0 lb Date of Birth:  December 21, 1965     BSA:          2.272 m Patient Age:    58 years      BP:           149/112 mmHg Patient Gender: F             HR:           86 bpm. Exam Location:  [address]  Procedure: 2D Echo, Cardiac Doppler, Color Doppler and Strain Analysis (Both Spectral and Color Flow Doppler were utilized during procedure).  Indications:    R06.09 Dyspnea  History:        Patient has prior history of Echocardiogram examinations, most recent 06/18/2014. Signs/Symptoms:Chest Pain; Risk Factors:Hypertension, Diabetes and Current Smoker.  Sonographer:    Waldo Guadalajara RCS Referring Phys: [id number] [facility] J PATWARDHAN  IMPRESSIONS   1. Poor interrogation of the aortic valve, not well visualized. V max 3.6 m/s, 36 mmHG, AVA 1.01 cm2, DI 0.24. Concerns for moderate to severe aortic stenosis, possibly severe. Would recommend a TEE for better characterization. The aortic valve was not well visualized. Aortic valve regurgitation is mild. Moderate to severe aortic valve stenosis. Aortic valve area, by VTI measures 1.01 cm. Aortic valve mean gradient measures 36.0 mmHg. Aortic valve Vmax measures 3.62 m/s. 2. Left ventricular ejection fraction, by estimation, is 65 to 70%. The left ventricle has normal function. The left ventricle has no regional wall motion abnormalities. Left ventricular diastolic parameters were normal. 3. Right ventricular systolic function is normal. The right ventricular size is normal. There is normal pulmonary artery systolic pressure. The estimated right ventricular systolic pressure is 9.2 mmHg. 4. The mitral valve is grossly normal. Trivial mitral valve regurgitation. No evidence of mitral stenosis. 5. The inferior vena cava is normal in size with greater than 50% respiratory variability, suggesting right atrial pressure of 3 mmHg.  FINDINGS Left  Ventricle: Left ventricular ejection fraction, by estimation, is 65 to 70%. The left ventricle has normal function. The left ventricle has no regional wall motion abnormalities. Global longitudinal strain performed but not reported based on interpreter judgement due to suboptimal tracking. The left ventricular internal cavity size was normal in size. There is no left ventricular hypertrophy. Left ventricular diastolic parameters were normal.  Right Ventricle: The right ventricular size is normal. No increase in right ventricular wall thickness. Right ventricular systolic function is normal. There is normal pulmonary artery systolic pressure. The tricuspid regurgitant velocity is 1.24 m/s, and with an assumed right atrial pressure of 3 mmHg, the estimated right ventricular systolic pressure is 9.2 mmHg.  Left Atrium: Left atrial size was normal in size.  Right Atrium: Right atrial size was normal in size. Prominent Crista terminalis.  Pericardium: There is no evidence of pericardial effusion.  Mitral Valve: The mitral valve is grossly normal. Trivial mitral valve regurgitation. No evidence of mitral valve stenosis.  Tricuspid Valve: The tricuspid valve is grossly normal. Tricuspid valve regurgitation is not demonstrated. No evidence of tricuspid stenosis.  Aortic Valve: Poor interrogation  of the aortic valve, not well visualized. V max 3.6 m/s, 36 mmHG, AVA 1.01 cm2, DI 0.24. Concerns for moderate to severe aortic stenosis, possibly severe. Would recommend a TEE for better characterization. The aortic valve was not well visualized. Aortic valve regurgitation is mild. Aortic regurgitation PHT measures 549 msec. Moderate to severe aortic stenosis is present. Aortic valve mean gradient measures 36.0 mmHg. Aortic valve peak gradient measures 52.4 mmHg. Aortic valve area, by VTI measures 1.01 cm.  Pulmonic Valve: The pulmonic valve was not well visualized. Pulmonic valve regurgitation is not  visualized.  Aorta: The aortic root and ascending aorta are structurally normal, with no evidence of dilitation.  Venous: The inferior vena cava is normal in size with greater than 50% respiratory variability, suggesting right atrial pressure of 3 mmHg.  IAS/Shunts: The atrial septum is grossly normal.   LEFT VENTRICLE PLAX 2D LVIDd:         4.30 cm   Diastology LVIDs:         3.20 cm   LV e' medial:    7.18 cm/s LV PW:         0.80 cm   LV E/e' medial:  13.1 LV IVS:        0.80 cm   LV e' lateral:   9.68 cm/s LVOT diam:     2.30 cm   LV E/e' lateral: 9.7 LV SV:         87 LV SV Index:   38 LVOT Area:     4.15 cm LV IVRT:       102 msec   RIGHT VENTRICLE RV Basal diam:  3.80 cm     PULMONARY VEINS RV S prime:     10.30 cm/s  A Reversal Velocity: 22.30 cm/s TAPSE (M-mode): 2.1 cm      Diastolic Velocity:  52.00 cm/s RVSP:           9.2 mmHg    S/D Velocity:        1.30 Systolic Velocity:   67.00 cm/s  LEFT ATRIUM             Index        RIGHT ATRIUM           Index LA diam:        3.40 cm 1.50 cm/m   RA Pressure: 3.00 mmHg LA Vol (A2C):   75.2 ml 33.10 ml/m  RA Area:     15.70 cm LA Vol (A4C):   28.3 ml 12.46 ml/m  RA Volume:   38.00 ml  16.73 ml/m LA Biplane Vol: 48.3 ml 21.26 ml/m AORTIC VALVE AV Area (Vmax):    1.08 cm AV Area (Vmean):   0.91 cm AV Area (VTI):     1.01 cm AV Vmax:           362.00 cm/s AV Vmean:          293.000 cm/s AV VTI:            0.856 m AV Peak Grad:      52.4 mmHg AV Mean Grad:      36.0 mmHg LVOT Vmax:         94.20 cm/s LVOT Vmean:        64.400 cm/s LVOT VTI:          0.209 m LVOT/AV VTI ratio: 0.24 AI PHT:            549 msec  AORTA Ao Root diam: 3.10 cm  Ao Asc diam:  3.40 cm  MITRAL VALVE                TRICUSPID VALVE TR Peak grad:   6.2 mmHg MV Decel Time:              TR Vmax:        124.00 cm/s MV E velocity: 93.80 cm/s   Estimated RAP:  3.00 mmHg MV A velocity: 101.00 cm/s  RVSP:           9.2 mmHg MV E/A ratio:   0.93 SHUNTS Systemic VTI:  0.21 m Systemic Diam: 2.30 cm  Darryle Decent MD Electronically signed by Darryle Decent MD Signature Date/Time: 12/13/2023/3:32:56 PM    Final          ______________________________________________________________________________________________      Risk Assessment/Calculations           Physical Exam VS:  BP 94/70   Pulse (!) 114   Ht 5' 6 (1.676 m)   Wt 258 lb (117 kg)   LMP 11/23/2015   SpO2 93%   BMI 41.64 kg/m        Wt Readings from Last 3 Encounters:  01/04/24 258 lb (117 kg)  12/20/23 268 lb (121.6 kg)  12/06/23 270 lb (122.5 kg)    GEN: Well nourished, well developed in no acute distress NECK: No JVD; No carotid bruits CARDIAC: Elevated but regular, no murmurs, rubs, gallops RESPIRATORY:  Clear to auscultation without rales, wheezing or rhonchi  ABDOMEN: Soft, non-tender, non-distended EXTREMITIES:  No edema; No deformity   ASSESSMENT AND PLAN Aortic stenosis -echo revealed moderate to severe aortic stenosis.  She is symptomatic with shortness of breath as well as dizziness although her shortness of breath has improved somewhat since she was started on Lasix .  Recommendations for TEE discussed with DOD Dr. Anner.  Explained risk versus benefits including esophageal damage/bleeding/dental injury--all questions were answered to her satisfaction and she agreed to proceed at an acceptable risk.  Will check CBC, c-Met.  Decrease Lasix  to once daily.  Coronary artery calcification-as noted on CT imaging, Stable with no anginal symptoms. No indication for ischemic evaluation.  Continue aspirin  81 mg daily, Lipitor 40 mg daily.  Dyslipidemia-with known coronary artery disease, need her LDL to be less than 70, currently on Lipitor 40 mg daily--will repeat c-Met, LDL, LP(a).  Hypokalemia-check c-Met.  Homelessness/transportation issues-Will refer to HRT navigation to see if there is anything to help her and her husband out  with.       Informed Consent   Shared Decision Making/Informed Consent   The risks [esophageal damage, perforation (1:10,000 risk), bleeding, pharyngeal hematoma as well as other potential complications associated with conscious sedation including aspiration, arrhythmia, respiratory failure and death], benefits (treatment guidance and diagnostic support) and alternatives of a transesophageal echocardiogram were discussed in detail with Ms. Ambriz and she is willing to proceed.      Dispo: Decrease Lasix  to once daily, c-Met, direct LDL, LP(a), CBC, proceed with TEE for evaluation of aortic stenosis.  Signed, Delon JAYSON Hoover, NP

## 2023-12-28 NOTE — Telephone Encounter (Signed)
 Copied from CRM [id number]. Topic: Appointments - Scheduling Inquiry for Clinic >> Dec 27, 2023 11:28 AM Emylou G wrote:  Reason for CRM: Patient called.. needs to get her MRI.Tracy Fitzgerald She would like the number to schedule or will they call her?  Pls call patient w/details

## 2023-12-29 ENCOUNTER — Ambulatory Visit (INDEPENDENT_AMBULATORY_CARE_PROVIDER_SITE_OTHER)

## 2023-12-29 ENCOUNTER — Ambulatory Visit (INDEPENDENT_AMBULATORY_CARE_PROVIDER_SITE_OTHER): Admitting: Podiatry

## 2023-12-29 DIAGNOSIS — M2142 Flat foot [pes planus] (acquired), left foot: Secondary | ICD-10-CM

## 2023-12-29 DIAGNOSIS — M216X2 Other acquired deformities of left foot: Secondary | ICD-10-CM

## 2023-12-29 DIAGNOSIS — M216X1 Other acquired deformities of right foot: Secondary | ICD-10-CM | POA: Diagnosis not present

## 2023-12-29 DIAGNOSIS — M2141 Flat foot [pes planus] (acquired), right foot: Secondary | ICD-10-CM

## 2023-12-29 NOTE — Progress Notes (Signed)
 Orthotics   Patient was present and evaluated for Custom molded foot orthotics. Patient will benefit from CFO's to provide total contact to BIL MLA's helping to balance and distribute body weight more evenly across BIL feet helping to reduce plantar pressure and pain. Orthotic will also encourage FF / RF alignment  Patient was scanned today and will return for fitting upon receipt  Healthy blue covered  Tracy Fitzgerald Cped, Cfo, CFm

## 2023-12-30 ENCOUNTER — Ambulatory Visit (HOSPITAL_COMMUNITY): Admitting: Clinical

## 2023-12-30 NOTE — Progress Notes (Signed)
 Subjective:   Patient ID: Tracy Fitzgerald, female   DOB: 58 y.o.   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  She is obese which is a complicating factor to condition.  Patient does smoke 1 pack a day also   Review of Systems  All other systems reviewed and are negative.       Objective:  Physical Exam Vitals and nursing note reviewed.  Constitutional:      Appearance: She is well-developed.  Pulmonary:     Effort: Pulmonary effort is normal.  Musculoskeletal:        General: Normal range of motion.  Skin:    General: Skin is warm.  Neurological:     Mental Status: She is alert.     Vascular status mildly reduced but intact neurologically diminishment of sharp dull vibratory bilateral.  Significant collapse of medial longitudinal arch bilateral with significant foot structural deformities large bunion hammertoe deformities inability to walk and high risk with diabetic neuropathy     Assessment:  Patient who does have significant structural abnormalities with diabetes and pain and inability to walk with degree of comfort     Plan:  H&P discussed cigarette smoking discussed diabetes and weight.  At this point I had pedorthist evaluate patient and patient is casted for functional orthotic devices to lift up the arch and tightened try to take some of the stress off the abnormal foot structure.  Patient will be seen back when orthotics are ready earlier if necessary and do not recommend current correction of structural deformity due to health risk  X-rays indicate significant collapse medial longitudinal arch bilateral multiple signs of arthritis structural bunion hammertoe deformity signed visit

## 2024-01-03 ENCOUNTER — Other Ambulatory Visit: Payer: Self-pay

## 2024-01-03 ENCOUNTER — Ambulatory Visit: Admitting: Internal Medicine

## 2024-01-03 ENCOUNTER — Ambulatory Visit: Attending: Internal Medicine | Admitting: Physical Therapy

## 2024-01-03 ENCOUNTER — Encounter: Payer: Self-pay | Admitting: Physical Therapy

## 2024-01-03 ENCOUNTER — Encounter: Payer: Self-pay | Admitting: Internal Medicine

## 2024-01-03 DIAGNOSIS — Z9181 History of falling: Secondary | ICD-10-CM | POA: Diagnosis not present

## 2024-01-03 DIAGNOSIS — G8929 Other chronic pain: Secondary | ICD-10-CM | POA: Insufficient documentation

## 2024-01-03 DIAGNOSIS — R296 Repeated falls: Secondary | ICD-10-CM | POA: Diagnosis present

## 2024-01-03 DIAGNOSIS — M6281 Muscle weakness (generalized): Secondary | ICD-10-CM | POA: Insufficient documentation

## 2024-01-03 DIAGNOSIS — R269 Unspecified abnormalities of gait and mobility: Secondary | ICD-10-CM | POA: Diagnosis not present

## 2024-01-03 DIAGNOSIS — R2689 Other abnormalities of gait and mobility: Secondary | ICD-10-CM | POA: Insufficient documentation

## 2024-01-03 DIAGNOSIS — M5441 Lumbago with sciatica, right side: Secondary | ICD-10-CM | POA: Diagnosis not present

## 2024-01-03 DIAGNOSIS — R2681 Unsteadiness on feet: Secondary | ICD-10-CM | POA: Insufficient documentation

## 2024-01-03 NOTE — Therapy (Signed)
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 upper body    [x]  allow to come closer to table top  [x]  remove for access to tables  []  provide support for w/c tray  [x]  change of height/angles for variable activities   []  Elbow support / Elbow stop  []  keep elbow positioned on arm pad  []  keep arms from falling off arm pad  during tilt and/or recline   Upper Extremity Support  []  Arm trough  []   R  []   L  Style:  []  swivel mount []  fixed mount   []  posterior hand support  []   tray  []  full tray  []  joystick cut out  []   R  []   L  Style:  []  decrease gravitational pull on      shoulders  []  provide support to increase UE  function  []  provide hand support in natural    position  []  position flaccid UE  []  decrease subluxation    []  decrease edema       []  manage spasticity   []  provide midline positioning  []  provide work surface  []  placement for AAC/ Computer/ EADL       Hangers/ Legrests   []  ______ degree  []  Elevating []  articulating  []  swing away []  fixed []  lift off  []  heavy  duty  []  adjustable knee angle  []  adjustable calf panel   []  longer extension tube              []   provide LE support  []  maintain placement of feet on      footplate   []  accommodate lower leg length  []  accommodate to hamstring       tightness  []  enable transfers  []  provide change in position for LE's  []  elevate legs during recline    []  decrease edema  []  durability      Foot support   []  footplate []  R []  L []  flip up           []  Depth adjustable   []  angle adjustable  []  foot board/one piece    []  provide foot support  []  accommodate to ankle ROM  []  allow foot to go under wheelchair base  []  enable transfers     []  Shoe holders  []  position foot    []  decrease / manage spasticity  []  control position of LE  []  stability    []  safety     []  Ankle strap/heel      loops  []  support foot on foot support  []  decrease extraneous movement  []  provide input to heel   []  protect foot     []  Amputee adapter []  R  []  L     Style:                  Size:  []  Provide support for stump/residual extremity    []  Transportation tie-down  []  to provide crash tested tie-down brackets    []  Crutch/cane holder    []  O2 holder    []  IV hanger   []  Ventilator tray/mount    []  stabilize accessory on wheelchair       Component  Justification     []  Seat cushion      []  accommodate impaired sensation  []  decubitus ulcers present or history  []  unable to shift weight  []  increase pressure distribution  []  prevent pelvic extension  []  custom required "off-the-shelf"    seat cushion will not accommodate deformity  []  stabilize/promote pelvis alignment  []  stabilize/promote femur alignment  []  accommodate obliquity  []  accommodate multiple deformity  []  incontinent/accidents  []  low maintenance     []  seat mounts                 []  fixed []  removable  []  attach seat platform/cushion to wheelchair frame    []  Seat wedge    []  provide increased aggressiveness of seat shape to decrease  sliding  down in the seat  []  accommodate ROM        []  Cover replacement   []  protect back or seat cushion  []  incontinent/accidents    []  Solid seat / insert    []  support cushion to prevent      hammocking  []  allows attachment of cushion to mobility base    []  Lateral pelvic/thigh/hip     support (Guides)     []  decrease abduction  []  accommodate pelvis  []  position upper legs  []  accommodate spasticity  []  removable for transfers     []  Lateral pelvic/thigh      supports mounts  []  fixed   []  swing-away   []  removable  []  mounts lateral pelvic/thigh supports     []  mounts lateral pelvic/thigh supports swing-away or removable for transfers    []  Medial thigh support (Pommel)  [] decrease adduction  [] accommodate ROM  []  remove for transfers   []  alignment      []   Medial thigh   []  fixed      support mounts      []  swing-away   []  removable  []  mounts medial thigh supports   []  Mounts medial supports swing- away or removable for transfers       Component  Justification   []  Back       []  provide posterior trunk support []  facilitate tone  []  provide lumbar/sacral support []  accommodate deformity  []  support trunk in midline   []  custom required "off-the-shelf" back support will not accommodate deformity   []  provide lateral trunk support []  accommodate or decrease tone            []  Back mounts  []  fixed  []  removable  []  attach back rest/cushion to wheelchair frame   []  Lateral trunk      supports  []  R []  L  []  decrease lateral trunk leaning  []  accommodate asymmetry    []  contour for increased contact  []  safety    []  control of tone    []  Lateral trunk      supports mounts  []  fixed  []  swing-away   []  removable  []  mounts lateral trunk supports     []  Mounts lateral trunk supports swing-away or removable for transfers   []  Anterior chest      strap, vest     []  decrease forward movement of shoulder  []  decrease forward movement of trunk  []  safety/stability  []   added abdominal support  []  trunk alignment  []  assistance with shoulder control   []  decrease shoulder elevation    []  Headrest      []  provide posterior head support  []  provide posterior neck support  []  provide lateral head support  []  provide anterior head support  []  support during tilt and recline  []  improve feeding     []  improve respiration  []  placement of switches  []  safety    []  accommodate ROM   []  accommodate tone  []  improve visual orientation   []  Headrest           []  fixed []  removable []  flip down      Mounting hardware   []  swing-away laterals/switches  []  mount headrest   []  mounts headrest flip down or  removable for transfers  []  mount headrest swing-away laterals   []  mount switches     []  Neck Support    []  decrease neck rotation  []  decrease forward neck flexion   Pelvic Positioner    []  std hip belt          []  padded hip belt  []  dual pull hip belt  []  four point hip belt  []  stabilize tone  []  decrease falling out of chair  []  prevent excessive extension  []  special pull angle to control      rotation  []  pad for protection over boney   prominence  []  promote comfort    []  Essential needs        bag/pouch   []  medicines []  special food rorthotics []  clothing changes  []  diapers  []  catheter/hygiene []  ostomy supplies   The above equipment has a life- long use expectancy.  Growth and changes in medical and/or functional conditions would be the exceptions.   SUMMARY:  Why mobility device was selected; include why a lower level device is not appropriate: Patient requires use of a power wheelchair in order to perform safe and independent  mobility in her home and in order to perform her MRADLs in a timely manner. Due to her impaired endurance and history of falls, poor dexterity, proximal hip weakness, chronic low back pain, nerve pain in the bilateral lower extremities and fingertips, and painful shoulder AROM she is unable to safely and efficiently  propel herself in a manual wheelchair without putting significant strain on her shoulder joints and putting herself at risk for injury. Additionally, due to her history of falls, proximal bilateral lower extremity weakness, severe forward lean on assistive device for ambulation, and chronic low back pain she is unable to ambulate further than 50 ft and has poor standing tolerance, therefore requiring power wheeled mobility.  Her 5xSTS score of 16.37 seconds using BUE support and demonstrating myoclonus with repetition demonstrates high fall risk.  She has dyspnea on exertion with short distance ambulation today and sometimes requires use of her inhaler as short distance ambulation exacerbates her symptoms in addition to causing spontaneous knee buckling due to ongoing lumbar radiculopathy.  She has a significant PMH that includes bilateral carpal tunnel impacting her poor dexterity due to nerve involvement as well and congestive heart failure impacting her fatigue levels and appropriate strain on her heart with activity which limits her ability to safely and efficiently use a manual wheelchair, drive a scooter or meet her other functional needs with an ambulatory assistive device such as a cane or walker.    ASSESSMENT:  CLINICAL IMPRESSION: Patient is a 58 y.o. female who was seen today for physical therapy evaluation and treatment for power wheelchair.   CLINICAL DECISION MAKING: Stable/uncomplicated  EVALUATION COMPLEXITY: High                                   GOALS: One time visit. No goals established.    PLAN: PT FREQUENCY: one time visit    Daved KATHEE Bull, PT, DPT 01/03/2024, 11:49 AM    I concur with the above findings and recommendations of the therapist:  Physician name printed:         Physician's signature:      Date:

## 2024-01-04 ENCOUNTER — Ambulatory Visit: Attending: Cardiology | Admitting: Cardiology

## 2024-01-04 ENCOUNTER — Encounter: Payer: Self-pay | Admitting: Cardiology

## 2024-01-04 VITALS — BP 94/70 | HR 114 | Ht 66.0 in | Wt 258.0 lb

## 2024-01-04 DIAGNOSIS — I10 Essential (primary) hypertension: Secondary | ICD-10-CM | POA: Diagnosis not present

## 2024-01-04 DIAGNOSIS — I2510 Atherosclerotic heart disease of native coronary artery without angina pectoris: Secondary | ICD-10-CM

## 2024-01-04 DIAGNOSIS — I350 Nonrheumatic aortic (valve) stenosis: Secondary | ICD-10-CM

## 2024-01-04 DIAGNOSIS — Z748 Other problems related to care provider dependency: Secondary | ICD-10-CM

## 2024-01-04 DIAGNOSIS — Z5900 Homelessness unspecified: Secondary | ICD-10-CM | POA: Diagnosis not present

## 2024-01-04 DIAGNOSIS — E782 Mixed hyperlipidemia: Secondary | ICD-10-CM

## 2024-01-04 DIAGNOSIS — R0609 Other forms of dyspnea: Secondary | ICD-10-CM

## 2024-01-04 LAB — LDL CHOLESTEROL, DIRECT

## 2024-01-04 MED ORDER — POTASSIUM CHLORIDE CRYS ER 20 MEQ PO TBCR: 20.0000 meq | EXTENDED_RELEASE_TABLET | Freq: Every day | ORAL | 3 refills | Status: AC

## 2024-01-04 NOTE — Patient Instructions (Addendum)
 Medication Instructions:  Decrease Furosemide  (Lasix ) 40 mg take 1 tablet daily   *If you need a refill on your cardiac medications before your next appointment, please call your pharmacy*  Lab Work: CMET, LDL, Lpa, CBC today  Testing/Procedures: Your physician has requested that you have a TEE. During a TEE, sound waves are used to create images of your heart. It provides your doctor with information about the size and shape of your heart and how well your heart's chambers and valves are working. In this test, a transducer is attached to the end of a flexible tube that's guided down your throat and into your esophagus (the tube leading from you mouth to your stomach) to get a more detailed image of your heart. You are not awake for the procedure. Please see the instruction sheet given to you today. For further information please visit [email or web address]    Follow-Up: At [facility], you and your health needs are our priority.  As part of our continuing mission to provide you with exceptional heart care, our providers are all part of one team.  This team includes your primary Cardiologist (physician) and Advanced Practice Providers or APPs (Physician Assistants and Nurse Practitioners) who all work together to provide you with the care you need, when you need it.  Your next appointment:   6 week(s)  Provider:   Newman JINNY Lawrence, MD    We recommend signing up for the patient portal called MyChart.  Sign up information is provided on this After Visit Summary.  MyChart is used to connect with patients for Virtual Visits (Telemedicine).  Patients are able to view lab/test results, encounter notes, upcoming appointments, etc.  Non-urgent messages can be sent to your provider as well.   To learn more about what you can do with MyChart, go to [email or web address].           You are scheduled for a TEE (Transesophageal Echocardiogram) on Wednesday, October 29 with Dr.  Jeffrie.  Please arrive at the [facility] (Main Entrance A) at [facility]: [address] [place], [place] [address] at 1:00 PM (This time is 1 hour(s) before your procedure to ensure your preparation).   Free valet parking service is available. You will check in at ADMITTING.   *Please Note: You will receive a call the day before your procedure to confirm the appointment time. That time may have changed from the original time based on the schedule for that day.*    DIET:  Nothing to eat or drink after midnight except a sip of water with medications (see medication instructions below)  MEDICATION INSTRUCTIONS: !!IF ANY NEW MEDICATIONS ARE STARTED AFTER TODAY, PLEASE NOTIFY YOUR PROVIDER AS SOON AS POSSIBLE!!  FYI: Medications such as Semaglutide  (Ozempic , Wegovy), Tirzepatide (Mounjaro, Zepbound), Dulaglutide  (Trulicity ), etc (GLP1 agonists) AND Canagliflozin (Invokana), Dapagliflozin (Farxiga), Empagliflozin (Jardiance), Ertugliflozin (Steglatro), Bexagliflozin [employer]) or any combination with one of these drugs such as Invokamet (Canagliflozin/Metformin ), Synjardy (Empagliflozin/Metformin ), etc (SGLT2 inhibitors) must be held around the time of a procedure. This is not a comprehensive list of all of these drugs. Please review all of your medications and talk to your provider if you take any one of these. If you are not sure, ask your provider.  HOLD: Semaglutide  (Ozempic , Rybelsus, Wegovy) for 7 days prior to the procedure. Last dose on Monday, October 20.   HOLD: Metformin  24 hours before and 48 hours after procedure.   LABS: TODAY  FYI:  For your safety, and to  allow us  to monitor your vital signs accurately during the surgery/procedure we request: If you have artificial nails, gel coating, SNS etc, please have those removed prior to your surgery/procedure. Not having the nail coverings /polish removed may result in cancellation or delay of your surgery/procedure.  Your  support person will be asked to wait in the waiting room during your procedure.  It is OK to have someone drop you off and come back when you are ready to be discharged.  You cannot drive after the procedure and will need someone to drive you home.  Bring your insurance cards.  *Special Note: Every effort is made to have your procedure done on time. Occasionally there are emergencies that occur at the hospital that may cause delays. Please be patient if a delay does occur.

## 2024-01-05 ENCOUNTER — Telehealth (HOSPITAL_BASED_OUTPATIENT_CLINIC_OR_DEPARTMENT_OTHER): Payer: Self-pay | Admitting: Licensed Clinical Social Worker

## 2024-01-05 LAB — COMPREHENSIVE METABOLIC PANEL WITH GFR
ALT: 20 IU/L (ref 0–32)
AST: 15 IU/L (ref 0–40)
Albumin: 4.2 g/dL (ref 3.8–4.9)
Alkaline Phosphatase: 86 IU/L (ref 49–135)
BUN/Creatinine Ratio: 18 (ref 9–23)
BUN: 28 mg/dL — ABNORMAL HIGH (ref 6–24)
Bilirubin Total: 0.4 mg/dL (ref 0.0–1.2)
CO2: 24 mmol/L (ref 20–29)
Calcium: 10.7 mg/dL — ABNORMAL HIGH (ref 8.7–10.2)
Chloride: 93 mmol/L — ABNORMAL LOW (ref 96–106)
Creatinine, Ser: 1.56 mg/dL — ABNORMAL HIGH (ref 0.57–1.00)
Globulin, Total: 2.5 g/dL (ref 1.5–4.5)
Glucose: 110 mg/dL — ABNORMAL HIGH (ref 70–99)
Potassium: 4.5 mmol/L (ref 3.5–5.2)
Sodium: 134 mmol/L (ref 134–144)
Total Protein: 6.7 g/dL (ref 6.0–8.5)
eGFR: 38 mL/min/1.73 — ABNORMAL LOW

## 2024-01-05 LAB — CBC
Hematocrit: 47.5 % — ABNORMAL HIGH (ref 34.0–46.6)
Hemoglobin: 16.1 g/dL — ABNORMAL HIGH (ref 11.1–15.9)
MCH: 31.2 pg (ref 26.6–33.0)
MCHC: 33.9 g/dL (ref 31.5–35.7)
MCV: 92 fL (ref 79–97)
Platelets: 295 x10E3/uL (ref 150–450)
RBC: 5.16 x10E6/uL (ref 3.77–5.28)
RDW: 13.2 % (ref 11.7–15.4)
WBC: 10.8 x10E3/uL (ref 3.4–10.8)

## 2024-01-05 LAB — LDL CHOLESTEROL, DIRECT: LDL Direct: 68 mg/dL (ref 0–99)

## 2024-01-05 LAB — LIPOPROTEIN A (LPA): Lipoprotein (a): 49 nmol/L

## 2024-01-05 NOTE — Progress Notes (Signed)
 Heart and Vascular Care Navigation  01/05/2024  Tracy Fitzgerald [id number] [id number]  Reason for Referral: homeless, transportation needs Patient is participating in a Managed Medicaid Plan:Yes  Engaged with patient by telephone for initial visit for Heart and Vascular Care Coordination.                                                                                                   Assessment:                LCSW was able to reach pt today at [phone]. Confirmed full name, DOB, and using [facility] address as current address as she and her spouse reside in a tent currently. Pt and spouse previously lived in their own place and with friends. Found neither to be stable therefore [place] residing [place] the woods. She does not work, spouse is disabled and they receive about $1250 a month in social security. Pt has access to SNAP benefits, they use money as able shares its expensive to be homeless due to costs of generator and food etc. Pt uses a friends address if needed for important items but otherwise states its just her and her husband. They have applied for Coordinated Entry housing list and unfortunately remain anywhere from 20-40 on the list. She understands this fluctuates due to funding, [employer] and new individuals/families being added to the list regularly. She does a good job of checking in every 2 weeks to make sure they have updated information. She does have access to public transportation and her Medicaid transport. She has her spouse able to accompany her to upcoming procedure. Unfortunately housing is difficult to connect with in this area. She is aware and working on many of the resources we offer but she is very grateful for the call.                     HRT/VAS Care Coordination     Home Assistive Devices/Equipment Eyeglasses; Contact lenses; CBG Meter; Walker (specify type)       Social History:                                                                             SDOH  Screenings   Food Insecurity: Food Insecurity Present (01/05/2024)  Housing: High Risk (01/05/2024)  Transportation Needs: No Transportation Needs (09/13/2023)  Recent Concern: Transportation Needs - Unmet Transportation Needs (08/09/2023)  Utilities: Patient Unable To Answer (01/05/2024)  Alcohol Screen: Low Risk  (08/02/2023)  Depression (PHQ2-9): High Risk (12/20/2023)  Financial Resource Strain: High Risk (01/05/2024)  Physical Activity: Insufficiently Active (08/02/2023)  Social Connections: Socially Isolated (08/02/2023)  Stress: Stress Concern Present (08/02/2023)  Tobacco Use: High Risk (01/04/2024)  Health Literacy: Adequate Health Literacy (01/05/2024)    SDOH Interventions: Financial Resources:  Financial Strain Interventions: [employer]  Provided (food, housing resources sent. rely on single income, on housing list and receive SNAP) DSS for financial assistance  Food Insecurity:  Food Insecurity Interventions: [employer] Provided (Environmental manager provided, pt recieves [employer])  Housing Insecurity:  Housing Interventions: [employer] Provided (housing Regulatory affairs officer; also is on Print production planner and checks in regularly)  Transportation:         Other Care Navigation Interventions:     Provided Pharmacy assistance resources  Currently able to access medications as prescribed  Patient expressed Mental Health concerns Yes, Referred to:  pt active with [facility], encouraged maintaining appts and using crisis resources as needed   Follow-up plan:   LCSW will send pt the following information: my card, housing and food resources. Encouraged her to call me as needed, and encouraged regular connection to Coordinated Entry team to maintain their place on housing waitlist.

## 2024-01-06 ENCOUNTER — Ambulatory Visit: Payer: Self-pay | Admitting: Cardiology

## 2024-01-06 DIAGNOSIS — Z79899 Other long term (current) drug therapy: Secondary | ICD-10-CM

## 2024-01-07 ENCOUNTER — Other Ambulatory Visit: Payer: Self-pay

## 2024-01-07 ENCOUNTER — Emergency Department (HOSPITAL_COMMUNITY)

## 2024-01-07 ENCOUNTER — Encounter (HOSPITAL_COMMUNITY): Payer: Self-pay

## 2024-01-07 ENCOUNTER — Observation Stay (HOSPITAL_COMMUNITY)
Admission: EM | Admit: 2024-01-07 | Discharge: 2024-01-10 | Disposition: A | Discharge status: Home / Self Care | Attending: Internal Medicine | Admitting: Internal Medicine

## 2024-01-07 DIAGNOSIS — I499 Cardiac arrhythmia, unspecified: Secondary | ICD-10-CM | POA: Diagnosis not present

## 2024-01-07 DIAGNOSIS — E1169 Type 2 diabetes mellitus with other specified complication: Secondary | ICD-10-CM | POA: Diagnosis not present

## 2024-01-07 DIAGNOSIS — G4733 Obstructive sleep apnea (adult) (pediatric): Secondary | ICD-10-CM | POA: Insufficient documentation

## 2024-01-07 DIAGNOSIS — E1142 Type 2 diabetes mellitus with diabetic polyneuropathy: Secondary | ICD-10-CM | POA: Diagnosis present

## 2024-01-07 DIAGNOSIS — J45909 Unspecified asthma, uncomplicated: Secondary | ICD-10-CM | POA: Insufficient documentation

## 2024-01-07 DIAGNOSIS — F32A Depression, unspecified: Secondary | ICD-10-CM | POA: Insufficient documentation

## 2024-01-07 DIAGNOSIS — I2510 Atherosclerotic heart disease of native coronary artery without angina pectoris: Secondary | ICD-10-CM | POA: Diagnosis not present

## 2024-01-07 DIAGNOSIS — R079 Chest pain, unspecified: Secondary | ICD-10-CM | POA: Diagnosis not present

## 2024-01-07 DIAGNOSIS — I152 Hypertension secondary to endocrine disorders: Secondary | ICD-10-CM | POA: Diagnosis present

## 2024-01-07 DIAGNOSIS — R918 Other nonspecific abnormal finding of lung field: Secondary | ICD-10-CM | POA: Diagnosis not present

## 2024-01-07 DIAGNOSIS — I491 Atrial premature depolarization: Secondary | ICD-10-CM | POA: Diagnosis not present

## 2024-01-07 DIAGNOSIS — I350 Nonrheumatic aortic (valve) stenosis: Secondary | ICD-10-CM

## 2024-01-07 DIAGNOSIS — Z79899 Other long term (current) drug therapy: Secondary | ICD-10-CM | POA: Insufficient documentation

## 2024-01-07 DIAGNOSIS — F419 Anxiety disorder, unspecified: Secondary | ICD-10-CM | POA: Diagnosis not present

## 2024-01-07 DIAGNOSIS — I700 Atherosclerosis of aorta: Secondary | ICD-10-CM | POA: Diagnosis not present

## 2024-01-07 DIAGNOSIS — D45 Polycythemia vera: Secondary | ICD-10-CM | POA: Diagnosis not present

## 2024-01-07 DIAGNOSIS — R0789 Other chest pain: Secondary | ICD-10-CM | POA: Diagnosis not present

## 2024-01-07 DIAGNOSIS — F17210 Nicotine dependence, cigarettes, uncomplicated: Secondary | ICD-10-CM | POA: Insufficient documentation

## 2024-01-07 DIAGNOSIS — R457 State of emotional shock and stress, unspecified: Secondary | ICD-10-CM | POA: Diagnosis not present

## 2024-01-07 DIAGNOSIS — I110 Hypertensive heart disease with heart failure: Secondary | ICD-10-CM | POA: Diagnosis not present

## 2024-01-07 DIAGNOSIS — I5032 Chronic diastolic (congestive) heart failure: Secondary | ICD-10-CM | POA: Diagnosis not present

## 2024-01-07 DIAGNOSIS — E785 Hyperlipidemia, unspecified: Secondary | ICD-10-CM | POA: Insufficient documentation

## 2024-01-07 DIAGNOSIS — E1140 Type 2 diabetes mellitus with diabetic neuropathy, unspecified: Secondary | ICD-10-CM | POA: Insufficient documentation

## 2024-01-07 DIAGNOSIS — Z7982 Long term (current) use of aspirin: Secondary | ICD-10-CM | POA: Insufficient documentation

## 2024-01-07 DIAGNOSIS — R0602 Shortness of breath: Secondary | ICD-10-CM | POA: Diagnosis not present

## 2024-01-07 LAB — CBC WITH DIFFERENTIAL/PLATELET
Abs Immature Granulocytes: 0.06 K/uL (ref 0.00–0.07)
Basophils Absolute: 0.1 K/uL (ref 0.0–0.1)
Basophils Relative: 1 %
Eosinophils Absolute: 0.2 K/uL (ref 0.0–0.5)
Eosinophils Relative: 3 %
HCT: 47.3 % — ABNORMAL HIGH (ref 36.0–46.0)
Hemoglobin: 15.9 g/dL — ABNORMAL HIGH (ref 12.0–15.0)
Immature Granulocytes: 1 %
Lymphocytes Relative: 22 %
Lymphs Abs: 1.9 K/uL (ref 0.7–4.0)
MCH: 31.1 pg (ref 26.0–34.0)
MCHC: 33.6 g/dL (ref 30.0–36.0)
MCV: 92.6 fL (ref 80.0–100.0)
Monocytes Absolute: 0.7 K/uL (ref 0.1–1.0)
Monocytes Relative: 8 %
Neutro Abs: 5.6 K/uL (ref 1.7–7.7)
Neutrophils Relative %: 65 %
Platelets: 270 K/uL (ref 150–400)
RBC: 5.11 MIL/uL (ref 3.87–5.11)
RDW: 13.2 % (ref 11.5–15.5)
WBC: 8.5 K/uL (ref 4.0–10.5)
nRBC: 0 % (ref 0.0–0.2)

## 2024-01-07 LAB — COMPREHENSIVE METABOLIC PANEL WITH GFR
ALT: 25 U/L (ref 0–44)
AST: 25 U/L (ref 15–41)
Albumin: 3.5 g/dL (ref 3.5–5.0)
Alkaline Phosphatase: 69 U/L (ref 38–126)
Anion gap: 11 (ref 5–15)
BUN: 18 mg/dL (ref 6–20)
CO2: 21 mmol/L — ABNORMAL LOW (ref 22–32)
Calcium: 9.6 mg/dL (ref 8.9–10.3)
Chloride: 103 mmol/L (ref 98–111)
Creatinine, Ser: 1.05 mg/dL — ABNORMAL HIGH (ref 0.44–1.00)
GFR, Estimated: >60 mL/min (ref >60)
Glucose, Bld: 115 mg/dL — ABNORMAL HIGH (ref 70–99)
Potassium: 4.5 mmol/L (ref 3.5–5.1)
Sodium: 135 mmol/L (ref 135–145)
Total Bilirubin: 1 mg/dL (ref 0.0–1.2)
Total Protein: 6.6 g/dL (ref 6.5–8.1)

## 2024-01-07 LAB — D-DIMER, QUANTITATIVE: D-Dimer, Quant: 0.34 ug{FEU}/mL (ref 0.00–0.50)

## 2024-01-07 LAB — TROPONIN I (HIGH SENSITIVITY)
Troponin I (High Sensitivity): 8 ng/L (ref <18)
Troponin I (High Sensitivity): 10 ng/L (ref <18)

## 2024-01-07 LAB — MAGNESIUM: Magnesium: 1.6 mg/dL — ABNORMAL LOW (ref 1.7–2.4)

## 2024-01-07 LAB — BRAIN NATRIURETIC PEPTIDE: B Natriuretic Peptide: 15.2 pg/mL (ref 0.0–100.0)

## 2024-01-07 MED ORDER — SENNOSIDES-DOCUSATE SODIUM 8.6-50 MG PO TABS
1.0000 | ORAL_TABLET | Freq: Every evening | ORAL | Status: DC | PRN
Start: 2024-01-07 — End: 2024-01-10

## 2024-01-07 MED ORDER — SERTRALINE HCL 100 MG PO TABS
200.0000 mg | ORAL_TABLET | Freq: Every day | ORAL | Status: DC
  Administered 2024-01-08 – 2024-01-10 (×3): 200 mg via ORAL
  Filled 2024-01-07 (×3): qty 2

## 2024-01-07 MED ORDER — GABAPENTIN 300 MG PO CAPS
900.0000 mg | ORAL_CAPSULE | Freq: Every day | ORAL | Status: DC
  Administered 2024-01-07 – 2024-01-09 (×3): 900 mg via ORAL
  Filled 2024-01-07 (×3): qty 3

## 2024-01-07 MED ORDER — NICOTINE 21 MG/24HR TD PT24
21.0000 mg | MEDICATED_PATCH | Freq: Every day | TRANSDERMAL | Status: DC
  Administered 2024-01-07 – 2024-01-10 (×4): 21 mg via TRANSDERMAL
  Filled 2024-01-07 (×4): qty 1

## 2024-01-07 MED ORDER — INSULIN ASPART 100 UNIT/ML IJ SOLN
0.0000 [IU] | Freq: Three times a day (TID) | INTRAMUSCULAR | Status: DC
  Administered 2024-01-08: 2 [IU] via SUBCUTANEOUS
  Administered 2024-01-08: 3 [IU] via SUBCUTANEOUS
  Administered 2024-01-08: 1 [IU] via SUBCUTANEOUS
  Administered 2024-01-09: 2 [IU] via SUBCUTANEOUS
  Administered 2024-01-09 (×2): 1 [IU] via SUBCUTANEOUS
  Administered 2024-01-10: 2 [IU] via SUBCUTANEOUS
  Administered 2024-01-10: 1 [IU] via SUBCUTANEOUS

## 2024-01-07 MED ORDER — FUROSEMIDE 40 MG PO TABS
40.0000 mg | ORAL_TABLET | Freq: Every day | ORAL | Status: DC
  Administered 2024-01-08 – 2024-01-10 (×3): 40 mg via ORAL
  Filled 2024-01-07 (×3): qty 1

## 2024-01-07 MED ORDER — LISINOPRIL 5 MG PO TABS
5.0000 mg | ORAL_TABLET | Freq: Every day | ORAL | Status: DC
  Administered 2024-01-08 – 2024-01-10 (×3): 5 mg via ORAL
  Filled 2024-01-07 (×3): qty 1

## 2024-01-07 MED ORDER — ACETAMINOPHEN 650 MG RE SUPP: 650.0000 mg | Freq: Four times a day (QID) | RECTAL | Status: DC | PRN

## 2024-01-07 MED ORDER — POTASSIUM CHLORIDE CRYS ER 20 MEQ PO TBCR
20.0000 meq | EXTENDED_RELEASE_TABLET | Freq: Every day | ORAL | Status: DC
  Administered 2024-01-08 – 2024-01-10 (×3): 20 meq via ORAL
  Filled 2024-01-07 (×3): qty 1

## 2024-01-07 MED ORDER — ONDANSETRON HCL 4 MG/2ML IJ SOLN: 4.0000 mg | Freq: Four times a day (QID) | INTRAMUSCULAR | Status: DC | PRN

## 2024-01-07 MED ORDER — ALBUTEROL SULFATE (2.5 MG/3ML) 0.083% IN NEBU: 3.0000 mL | INHALATION_SOLUTION | RESPIRATORY_TRACT | Status: DC | PRN

## 2024-01-07 MED ORDER — SODIUM CHLORIDE 0.9% FLUSH
3.0000 mL | Freq: Two times a day (BID) | INTRAVENOUS | Status: DC
  Administered 2024-01-07 – 2024-01-10 (×6): 3 mL via INTRAVENOUS

## 2024-01-07 MED ORDER — ASPIRIN 81 MG PO TBEC
81.0000 mg | DELAYED_RELEASE_TABLET | Freq: Every day | ORAL | Status: DC
  Administered 2024-01-07 – 2024-01-10 (×4): 81 mg via ORAL
  Filled 2024-01-07 (×4): qty 1

## 2024-01-07 MED ORDER — ATORVASTATIN CALCIUM 40 MG PO TABS
40.0000 mg | ORAL_TABLET | Freq: Every day | ORAL | Status: DC
  Administered 2024-01-07 – 2024-01-08 (×2): 40 mg via ORAL
  Filled 2024-01-07 (×2): qty 1

## 2024-01-07 MED ORDER — MAGNESIUM SULFATE 2 GM/50ML IV SOLN
2.0000 g | Freq: Once | INTRAVENOUS | Status: AC
  Administered 2024-01-07: 2 g via INTRAVENOUS
  Filled 2024-01-07: qty 50

## 2024-01-07 MED ORDER — ONDANSETRON HCL 4 MG PO TABS: 4.0000 mg | ORAL_TABLET | Freq: Four times a day (QID) | ORAL | Status: DC | PRN

## 2024-01-07 MED ORDER — ARIPIPRAZOLE 5 MG PO TABS
10.0000 mg | ORAL_TABLET | Freq: Every day | ORAL | Status: DC
  Administered 2024-01-08 – 2024-01-10 (×3): 10 mg via ORAL
  Filled 2024-01-07 (×3): qty 2

## 2024-01-07 MED ORDER — ENOXAPARIN SODIUM 60 MG/0.6ML IJ SOSY
50.0000 mg | PREFILLED_SYRINGE | INTRAMUSCULAR | Status: DC
  Administered 2024-01-08 – 2024-01-10 (×3): 50 mg via SUBCUTANEOUS
  Filled 2024-01-07 (×3): qty 0.6

## 2024-01-07 MED ORDER — FLUTICASONE FUROATE-VILANTEROL 100-25 MCG/ACT IN AEPB
1.0000 | INHALATION_SPRAY | Freq: Every day | RESPIRATORY_TRACT | Status: DC
  Administered 2024-01-08 – 2024-01-10 (×3): 1 via RESPIRATORY_TRACT
  Filled 2024-01-07 (×2): qty 28

## 2024-01-07 MED ORDER — GABAPENTIN 300 MG PO CAPS
600.0000 mg | ORAL_CAPSULE | Freq: Three times a day (TID) | ORAL | Status: DC
  Administered 2024-01-08 – 2024-01-10 (×7): 600 mg via ORAL
  Filled 2024-01-07 (×7): qty 2

## 2024-01-07 MED ORDER — ACETAMINOPHEN 325 MG PO TABS
650.0000 mg | ORAL_TABLET | Freq: Four times a day (QID) | ORAL | Status: DC | PRN
  Administered 2024-01-09 – 2024-01-10 (×4): 650 mg via ORAL
  Filled 2024-01-07 (×4): qty 2

## 2024-01-07 MED ORDER — BUPROPION HCL ER (SR) 150 MG PO TB12
150.0000 mg | ORAL_TABLET | Freq: Two times a day (BID) | ORAL | Status: DC
  Administered 2024-01-07 – 2024-01-10 (×5): 150 mg via ORAL
  Filled 2024-01-07 (×10): qty 1

## 2024-01-07 NOTE — ED Provider Notes (Signed)
  EMERGENCY DEPARTMENT AT [facility] Provider Note   CSN: [id number] Arrival date & time: 01/07/24  1409     Patient presents with: Chest Pain   Tracy Fitzgerald is Fitzgerald 58 y.o. female.  With Fitzgerald history of aortic stenosis, heart failure, type 2 diabetes who presents to the ED for chest pain.  Patient reports that chest pain started last night while she was watching TV.  Localized over substernal region with radiation to both arms.  Made worse with exertion.  Resolved spontaneously after resting but did return after walking short distance today at home.  Took 324 aspirin  prior to arrival.  Significant shortness of breath diaphoresis nausea vomiting fevers or chills.  Patient is scheduled to undergo transesophageal echo in 5 days for Concern of severe aortic stenosis.  She is currently living in Fitzgerald large tent with her husband and notes that the cold weather may have contributed to her symptoms.    Chest Pain      Prior to Admission medications   Medication Sig Start Date End Date Taking? Authorizing Provider  Accu-Chek Softclix Lancets lancets Test blood sugar once Fitzgerald day 11/22/22   Vicci Barnie NOVAK, MD  albuterol  (VENTOLIN  HFA) 108 (90 Base) MCG/ACT inhaler INHALE 2 PUFFS INTO THE LUNGS EVERY 4 HOURS AS NEEDED FOR WHEEZING OR SHORTNESS OF BREATH 11/16/23   Vicci Barnie NOVAK, MD  ARIPiprazole  (ABILIFY ) 10 MG tablet Take 1 tablet (10 mg total) by mouth daily. 12/14/23   Tracy Fitzgerald  aspirin  EC 81 MG tablet Take 1 tablet (81 mg total) by mouth daily. Swallow whole. 12/31/19   Vicci Barnie NOVAK, MD  atorvastatin  (LIPITOR) 40 MG tablet Take 1 tablet (40 mg total) by mouth daily. 12/06/23   Vicci Barnie NOVAK, MD  Blood Glucose Monitoring Suppl (ACCU-CHEK GUIDE) w/Device KIT Test blood sugar once Fitzgerald day 12/06/23   Vicci Barnie NOVAK, MD  buPROPion  (ZYBAN ) 150 MG 12 hr tablet TAKE 1 TABLET BY MOUTH TWICE Fitzgerald DAY 11/09/23   Vicci Barnie NOVAK, MD  Carboxymethylcellulose Sodium (EYE  DROPS OP) Place 1 drop into both eyes daily as needed (dry eyes). Multi- purpose eye drops saline    [provider]  furosemide  (LASIX ) 40 MG tablet TAKE 1 TABLET BY MOUTH TWICE Fitzgerald DAY Patient taking differently: Take 40 mg by mouth daily. 12/21/23   Patwardhan, Newman PARAS, MD  gabapentin  (NEURONTIN ) 300 MG capsule Take 2 capsules (600 mg total) by mouth 3 (three) times daily AND 3 capsules (900 mg total) at bedtime. 06/08/23   Danton Jon HERO, PA-C  glucose blood (ACCU-CHEK GUIDE) test strip Test blood sugar once Fitzgerald day 11/22/22   Vicci Barnie NOVAK, MD  lidocaine  (LIDODERM ) 5 % PLACE 1 PATCH ONTO SKIN DAILY TO LOWER BACK, REMOVE AFTER 12 HOURS Patient taking differently: Place 1 patch onto the skin daily as needed (pain). 12/21/23   Vicci Barnie NOVAK, MD  lisinopril  (ZESTRIL ) 5 MG tablet Take 1 tablet (5 mg total) by mouth daily. 12/06/23   Vicci Barnie NOVAK, MD  meloxicam  (MOBIC ) 15 MG tablet Take 1 tablet (15 mg total) by mouth daily as needed for pain Patient taking differently: Take 15 mg by mouth daily. Daily 06/08/23   Danton Jon HERO, PA-C  metFORMIN  (GLUCOPHAGE -XR) 500 MG 24 hr tablet Take 1 tablet (500 mg total) by mouth 2 (two) times daily with Fitzgerald meal. 08/02/23   Vicci Barnie NOVAK, MD  mometasone-formoterol (DULERA) 100-5 MCG/ACT AERO Inhale 2 puffs into the lungs  2 (two) times daily. 12/20/23   Vicci Barnie NOVAK, MD  potassium chloride  SA (KLOR-CON  M) 20 MEQ tablet Take 1 tablet (20 mEq total) by mouth daily. 01/04/24   Carlin Delon BROCKS, NP  Semaglutide ,0.25 or 0.5MG /DOS, (OZEMPIC , 0.25 OR 0.5 MG/DOSE,) 2 MG/3ML SOPN Inject 0.25 mg into the skin once Fitzgerald week. Pharmacist to teach administration 12/06/23   Vicci Barnie NOVAK, MD  sertraline  (ZOLOFT ) 100 MG tablet Take 2 tablets (200 mg total) by mouth daily. 12/06/23   Vicci Barnie NOVAK, MD    Allergies: Cymbalta  [duloxetine  hcl]    Review of Systems  Cardiovascular:  Positive for chest pain.    Updated Vital Signs BP 123/79    Pulse 85   Temp 98.3 F (36.8 C) (Oral)   Resp 19   LMP 11/23/2015   SpO2 100%   Physical Exam Vitals and nursing note reviewed.  HENT:     Head: Normocephalic and atraumatic.  Eyes:     Pupils: Pupils are equal, round, and reactive to light.  Cardiovascular:     Rate and Rhythm: Normal rate and regular rhythm.     Heart sounds: Murmur heard.     Crescendo decrescendo systolic murmur is present with Fitzgerald grade of 4/6.  Pulmonary:     Effort: Pulmonary effort is normal.     Breath sounds: Normal breath sounds.  Abdominal:     Palpations: Abdomen is soft.     Tenderness: There is no abdominal tenderness.  Skin:    General: Skin is warm and dry.  Neurological:     Mental Status: She is alert.  Psychiatric:        Mood and Affect: Mood normal.     (all labs ordered are listed, but only abnormal results are displayed) Labs Reviewed  COMPREHENSIVE METABOLIC PANEL WITH GFR - Abnormal; Notable for the following components:      Result Value   CO2 21 (*)    Glucose, Bld 115 (*)    Creatinine, Ser 1.05 (*)    All other components within normal limits  CBC WITH DIFFERENTIAL/PLATELET - Abnormal; Notable for the following components:   Hemoglobin 15.9 (*)    HCT 47.3 (*)    All other components within normal limits  MAGNESIUM - Abnormal; Notable for the following components:   Magnesium 1.6 (*)    All other components within normal limits  BRAIN NATRIURETIC PEPTIDE  TROPONIN I (HIGH SENSITIVITY)  TROPONIN I (HIGH SENSITIVITY)    EKG: EKG Interpretation Date/Time:  Saturday January 07 2024 15:40:54 EDT Ventricular Rate:  108 PR Interval:  201 QRS Duration:  108 QT Interval:  333 QTC Calculation: 447 R Axis:   -78  Text Interpretation: Sinus tachycardia Borderline prolonged PR interval Left anterior fascicular block Low voltage, precordial leads Baseline wander in lead(s) I III aVL overall similar to Jan 04 2024 Confirmed by Freddi Hamilton [phone]) on 01/07/2024 3:46:19  PM  Radiology: DG Chest Portable 1 View Result Date: 01/07/2024 CLINICAL DATA:  Chest pain. EXAM: PORTABLE CHEST 1 VIEW COMPARISON:  10/06/2023 FINDINGS: The heart is upper normal in size. Mediastinal contours are stable. Aortic atherosclerosis. Basilar interstitial opacities. No confluent airspace disease, pneumothorax or large pleural effusion. IMPRESSION: Basilar interstitial opacities may represent edema or infection. Electronically Signed   By: Andrea Gasman M.D.   On: 01/07/2024 15:09     Procedures   Medications Ordered in the ED  magnesium sulfate IVPB 2 g 50 mL (has no administration in time range)  Clinical Course as of 01/07/24 1556  Sat Jan 07, 2024  1555 Initial laboratory workup notable for hypomagnesemia.  Will provide IV repletion.  Initial troponin of 8 will obtain delta.  No significant elevation in BNP.  LILLETTE Ozell Marine DO, am transitioning care of this patient to the oncoming provider pending delta troponin reevaluation and disposition [MP]    Clinical Course User Index [MP] Marine Ozell LABOR, DO                                 Medical Decision Making 58 year old female with history as above presented to ED for chest pain at rest.  Radiation to both arms.  Currently undergoing evaluation for severe aortic stenosis with plan for TEE next week.  Chest pain has resolved in the ED but her story of chest pain at rest with severe aortic stenosis is certainly concerning.  Will obtain cardiac workup to evaluate for ACS and continue to monitor on telemetry.  324 aspirin  prior to arrival.  She would be moderate risk category on heart score.  Admission likely.  Amount and/or Complexity of Data Reviewed Labs: ordered. Radiology: ordered.  Risk Prescription drug management.        Final diagnoses:  Aortic valve stenosis, etiology of cardiac valve disease unspecified  Chest pain, unspecified type    ED Discharge Orders     None          Marine Ozell LABOR, DO 01/07/24 1556

## 2024-01-07 NOTE — Consult Note (Signed)
 Cardiology Consultation   Patient ID: Tracy Fitzgerald MRN: [id number]; DOB: 08-25-1965  Admit date: 01/07/2024 Date of Consult: 01/07/2024  PCP:  Vicci Barnie NOVAK, MD    [facility] Providers Cardiologist:  Newman JINNY Lawrence, MD  Cardiology APP:  Carlin Delon BROCKS, NP    Patient Profile: Tracy Fitzgerald is a 58 y.o. female with PMH noted below who is being seen 01/07/2024 for the evaluation of chest pain at the request of general internal medicine inpatient team.  History of Present Illness: Tracy Fitzgerald has a history of moderate to severe aortic stenosis per TTE in 11/2023, hypertension, type 2 diabetes mellitus, obstructive sleep apnea on CPAP, asthma, and left anterior fascicular block.  She presents to the ED for chest pain.  She describes the chest pain as retrosternal radiating to both arms, made worse by exertion.  Last night she had sudden onset of this chest pain while she was watching television.  She presented to [facility] ED where her blood work showed BNP 15, HST 8 and 10, reassuring CMP, and unremarkable CBC.  She has a pending TEE ordered later in the week, so decision was made to admit the patient to internal medicine with cardiology consult.  On my assessment, she was sitting comfortably in bed with no chest pain.  She notes an episode of chest pain earlier in the day that was at rest, but also reports several months worth of readily reproducible chest pain with exertion.  She had a brother who passed from complications of MI in his late 35s.  She is an active smoker since the age of 40, has diabetes, and BMI 41.  Of note, she had a myocardial perfusion study on 11/02/2023 that showed no evidence of ischemia.  To the best of her knowledge and on my review, she has not had coronary angiography.   Past Medical History:  Diagnosis Date   Aortic stenosis    Asthma    CHF (congestive heart failure) (HCC)    Coronary artery calcification seen on CT scan     Depression    Diabetes mellitus without complication (HCC)    Headache    Hypertension    LAFB (left anterior fascicular block)    Pain disorder    PTSD (post-traumatic stress disorder)    Sleep apnea    CPAP    Past Surgical History:  Procedure Laterality Date   ADENOIDECTOMY     COLONOSCOPY WITH PROPOFOL  N/A 05/24/2017   Procedure: COLONOSCOPY WITH PROPOFOL ;  Surgeon: Albertus Gordy HERO, MD;  Location: [facility];  Service: Gastroenterology;  Laterality: N/A;   TONSILLECTOMY       Home Medications:  Prior to Admission medications   Medication Sig Start Date End Date Taking? Authorizing Provider  Accu-Chek Softclix Lancets lancets Test blood sugar once a day 11/22/22   Vicci Barnie NOVAK, MD  albuterol  (VENTOLIN  HFA) [id number] Base) MCG/ACT inhaler INHALE 2 PUFFS INTO THE LUNGS EVERY 4 HOURS AS NEEDED FOR WHEEZING OR SHORTNESS OF BREATH 11/16/23   Vicci Barnie NOVAK, MD  ARIPiprazole  (ABILIFY ) 10 MG tablet Take 1 tablet (10 mg total) by mouth daily. 12/14/23   BahrainiLauraine LABOR  aspirin  EC 81 MG tablet Take 1 tablet (81 mg total) by mouth daily. Swallow whole. 12/31/19   Vicci Barnie NOVAK, MD  atorvastatin  (LIPITOR) 40 MG tablet Take 1 tablet (40 mg total) by mouth daily. 12/06/23   Vicci Barnie NOVAK, MD  Blood Glucose Monitoring Suppl (ACCU-CHEK GUIDE) w/Device KIT Test blood  sugar once a day 12/06/23   Vicci Barnie NOVAK, MD  buPROPion  (ZYBAN ) 150 MG 12 hr tablet TAKE 1 TABLET BY MOUTH TWICE A DAY 11/09/23   Vicci Barnie NOVAK, MD  Carboxymethylcellulose Sodium (EYE DROPS OP) Place 1 drop into both eyes daily as needed (dry eyes). Multi- purpose eye drops saline    [provider]  furosemide  (LASIX ) 40 MG tablet TAKE 1 TABLET BY MOUTH TWICE A DAY Patient taking differently: Take 40 mg by mouth daily. 12/21/23   Patwardhan, Newman PARAS, MD  gabapentin  (NEURONTIN ) 300 MG capsule Take 2 capsules (600 mg total) by mouth 3 (three) times daily AND 3 capsules (900 mg total) at bedtime. 06/08/23    Danton Jon HERO, PA-C  glucose blood (ACCU-CHEK GUIDE) test strip Test blood sugar once a day 11/22/22   Vicci Barnie NOVAK, MD  lidocaine  (LIDODERM ) 5 % PLACE 1 PATCH ONTO SKIN DAILY TO LOWER BACK, REMOVE AFTER 12 HOURS Patient taking differently: Place 1 patch onto the skin daily as needed (pain). 12/21/23   Vicci Barnie NOVAK, MD  lisinopril  (ZESTRIL ) 5 MG tablet Take 1 tablet (5 mg total) by mouth daily. 12/06/23   Vicci Barnie NOVAK, MD  meloxicam  (MOBIC ) 15 MG tablet Take 1 tablet (15 mg total) by mouth daily as needed for pain Patient taking differently: Take 15 mg by mouth daily. Daily 06/08/23   Danton Jon HERO, PA-C  metFORMIN  (GLUCOPHAGE -XR) 500 MG 24 hr tablet Take 1 tablet (500 mg total) by mouth 2 (two) times daily with a meal. 08/02/23   Vicci Barnie NOVAK, MD  mometasone-formoterol (DULERA) 100-5 MCG/ACT AERO Inhale 2 puffs into the lungs 2 (two) times daily. 12/20/23   Vicci Barnie NOVAK, MD  potassium chloride  SA (KLOR-CON  M) 20 MEQ tablet Take 1 tablet (20 mEq total) by mouth daily. 01/04/24   Carlin Delon BROCKS, NP  Semaglutide ,0.25 or 0.5MG /DOS, (OZEMPIC , 0.25 OR 0.5 MG/DOSE,) 2 MG/3ML SOPN Inject 0.25 mg into the skin once a week. Pharmacist to teach administration 12/06/23   Vicci Barnie NOVAK, MD  sertraline  (ZOLOFT ) 100 MG tablet Take 2 tablets (200 mg total) by mouth daily. 12/06/23   Vicci Barnie NOVAK, MD    Scheduled Meds:  NOREEN ON 01/08/2024] ARIPiprazole   10 mg Oral Daily   aspirin  EC  81 mg Oral Daily   atorvastatin   40 mg Oral Daily   buPROPion   150 mg Oral BID   [START ON 01/08/2024] enoxaparin  (LOVENOX ) injection  50 mg Subcutaneous Q24H   [START ON 01/08/2024] fluticasone furoate-vilanterol  1 puff Inhalation Daily   [START ON 01/08/2024] furosemide   40 mg Oral Daily   [START ON 01/08/2024] insulin aspart  0-9 Units Subcutaneous TID WC   [START ON 01/08/2024] lisinopril   5 mg Oral Daily   nicotine   21 mg Transdermal Daily   [START ON 01/08/2024] potassium  chloride SA  20 mEq Oral Daily   [START ON 01/08/2024] sertraline   200 mg Oral Daily   sodium chloride  flush  3 mL Intravenous Q12H   Continuous Infusions:  PRN Meds: acetaminophen  **OR** acetaminophen , albuterol , ondansetron  **OR** ondansetron  (ZOFRAN ) IV, senna-docusate  Allergies:    Allergies  Allergen Reactions   Cymbalta  [Duloxetine  Hcl]     Urinary retention    Social History:   Social History   Socioeconomic History   Marital status: Married    Spouse name: Not on file   Number of children: Not on file   Years of education: Not on file   Highest education  level: Not on file  Occupational History   Not on file  Tobacco Use   Smoking status: Every Day    Current packs/day: 1.00    Average packs/day: 1 pack/day for 33.0 years (33.0 ttl pk-yrs)    Types: Cigarettes   Smokeless tobacco: Current  Vaping Use   Vaping status: Never Used  Substance and Sexual Activity   Alcohol use: Not Currently    Comment: occ. monthly   Drug use: No   Sexual activity: Yes    Partners: Male    Birth control/protection: Post-menopausal  Other Topics Concern   Not on file  Social History Narrative   Not on file   Social Drivers of Health   Financial Resource Strain: High Risk (01/05/2024)   Overall Financial Resource Strain (CARDIA)    Difficulty of Paying Living Expenses: Very hard  Food Insecurity: Food Insecurity Present (01/07/2024)   Hunger Vital Sign    Worried About Running Out of Food in the Last Year: Often true    Ran Out of Food in the Last Year: Sometimes true  Transportation Needs: Unmet Transportation Needs (01/07/2024)   PRAPARE - Transportation    Lack of Transportation (Medical): Yes    Lack of Transportation (Non-Medical): Yes  Physical Activity: Insufficiently Active (08/02/2023)   Exercise Vital Sign    Days of Exercise per Week: 1 day    Minutes of Exercise per Session: 10 min  Stress: Stress Concern Present (08/02/2023)   [employer] of  Occupational Health - Occupational Stress Questionnaire    Feeling of Stress : Very much  Social Connections: Socially Isolated (08/02/2023)   Social Connection and Isolation Panel    Frequency of Communication with Friends and Family: Never    Frequency of Social Gatherings with Friends and Family: Never    Attends Religious Services: Never    Database Administrator or Organizations: No    Attends Banker Meetings: Never    Marital Status: Married  Catering Manager Violence: Not At Risk (01/07/2024)   Humiliation, Afraid, Rape, and Kick questionnaire    Fear of Current or Ex-Partner: No    Emotionally Abused: No    Physically Abused: No    Sexually Abused: No    Family History:   Family History  Problem Relation Age of Onset   Hypertension Mother    Diabetes Mother    Uterine cancer Mother    Heart disease Mother    Cancer Mother        UTERINE CANCER    Arthritis Mother    Depression Mother    Hypertension Father    Kidney disease Father    ADD / ADHD Son    ADD / ADHD Daughter    Breast cancer Neg Hx    Colon cancer Neg Hx      ROS:  ROS was otherwise negative Physical Exam/Data: Vitals:   01/07/24 1515 01/07/24 1927 01/07/24 2030 01/07/24 2033  BP:   (!) 136/92   Pulse: 85     Resp: 19  18   Temp:    97.7 F (36.5 C)  TempSrc:    Oral  SpO2: 100%     Height:  5' 6 (1.676 m)     No intake or output data in the 24 hours ending 01/07/24 2047    01/04/2024    1:18 PM 12/20/2023    4:01 PM 12/06/2023    3:08 PM  Last 3 Weights  Weight (lbs) 258 lb 268  lb 270 lb  Weight (kg) 117.028 kg 121.564 kg 122.471 kg     Body mass index is 41.64 kg/m.  General:  Well nourished, well developed, in no acute distress HEENT: normal Neck: no JVD Vascular: No carotid bruits; Distal pulses 2+ bilaterally Cardiac:  normal S1, S2; RRR; 3/6 crescendo decrescendo murmur at the right upper sternal border Lungs:  clear to auscultation bilaterally, no wheezing,  rhonchi or rales  Abd: soft, nontender, no hepatomegaly  Ext: no edema Musculoskeletal:  No deformities, BUE and BLE strength normal and equal Skin: warm and dry  Neuro:  CNs 2-12 intact, no focal abnormalities noted Psych:  Normal affect   EKG 01/07/2024:      Relevant CV Studies: TTE 12/13/2023: IMPRESSIONS     1. Poor interrogation of the aortic valve, not well visualized. V max 3.6  m/s, 36 mmHG, AVA 1.01 cm2, DI 0.24. Concerns for moderate to severe  aortic stenosis, possibly severe. Would recommend a TEE for better  characterization. The aortic valve was not  well visualized. Aortic valve regurgitation is mild. Moderate to severe  aortic valve stenosis. Aortic valve area, by VTI measures 1.01 cm. Aortic  valve mean gradient measures 36.0 mmHg. Aortic valve Vmax measures 3.62  m/s.   2. Left ventricular ejection fraction, by estimation, is 65 to 70%. The  left ventricle has normal function. The left ventricle has no regional  wall motion abnormalities. Left ventricular diastolic parameters were  normal.   3. Right ventricular systolic function is normal. The right ventricular  size is normal. There is normal pulmonary artery systolic pressure. The  estimated right ventricular systolic pressure is 9.2 mmHg.   4. The mitral valve is grossly normal. Trivial mitral valve  regurgitation. No evidence of mitral stenosis.   5. The inferior vena cava is normal in size with greater than 50%  respiratory variability, suggesting right atrial pressure of 3 mmHg.     Laboratory Data: High Sensitivity Troponin:   Recent Labs  Lab 01/07/24 1419 01/07/24 1606  TROPONINIHS 8 10     Chemistry Recent Labs  Lab 01/04/24 1504 01/07/24 1419  NA 134 135  K 4.5 4.5  CL 93* 103  CO2 24 21*  GLUCOSE 110* 115*  BUN 28* 18  CREATININE 1.56* 1.05*  CALCIUM  10.7* 9.6  MG  --  1.6*  GFRNONAA  --  >60  ANIONGAP  --  11    Recent Labs  Lab 01/04/24 1504 01/07/24 1419  PROT 6.7  6.6  ALBUMIN 4.2 3.5  AST 15 25  ALT 20 25  ALKPHOS 86 69  BILITOT 0.4 1.0   Lipids No results for input(s): CHOL, TRIG, HDL, LABVLDL, LDLCALC, CHOLHDL in the last 168 hours.  Hematology Recent Labs  Lab 01/04/24 1504 01/07/24 1419  WBC 10.8 8.5  RBC 5.16 5.11  HGB 16.1* 15.9*  HCT 47.5* 47.3*  MCV 92 92.6  MCH 31.2 31.1  MCHC 33.9 33.6  RDW 13.2 13.2  PLT 295 270   Thyroid No results for input(s): TSH, FREET4 in the last 168 hours.  BNP Recent Labs  Lab 01/07/24 1420  BNP 15.2    DDimer  Recent Labs  Lab 01/07/24 1606  DDIMER 0.34    Radiology/Studies:  DG Chest Portable 1 View Result Date: 01/07/2024 CLINICAL DATA:  Chest pain. EXAM: PORTABLE CHEST 1 VIEW COMPARISON:  10/06/2023 FINDINGS: The heart is upper normal in size. Mediastinal contours are stable. Aortic atherosclerosis. Basilar interstitial opacities. No confluent airspace  disease, pneumothorax or large pleural effusion. IMPRESSION: Basilar interstitial opacities may represent edema or infection. Electronically Signed   By: Andrea Gasman M.D.   On: 01/07/2024 15:09   Assessment and Plan: This is a 58 year old woman with moderate aortic stenosis per TTE in 11/2023, known family history of early CAD, at least 40-pack-year smoking history, obesity with BMI 41, type 2 diabetes, who presents with several months worth of stable angina and an episode of chest pain at rest concerning for unstable angina earlier in the day.  Her troponins were unremarkable in our ED, and her BNP is reassuring.  Overall, while her presentation may be consistent with progression of her aortic stenosis and planned TEE is not unreasonable, she has significant clinical risk factors as well as family history that warrant further investigation of her CAD with coronary angiography.  She took 324 mg of aspirin  earlier in the day.  I agree with assessment and plan by our internal medicine colleagues and would recommend repeat  TTE, obtain hemoglobin A1c, TSH/free T4, A1c, lipid panel, iron studies, high-sensitivity CRP, LP(a).  Please start high intensity statin (consider atorvastatin  80 mg daily) and aspirin  81 mg daily.  I would defer all potentially nephrotoxic medications in anticipation of potential coronary geography early next week. Cardiology will continue to follow and will see her in the morning.  Please reach out to cardiology overnight should she have another episode of chest pain.  For questions or updates, please contact [facility] Please consult [email or web address] for contact info under    Signed, Donley LOISE Devonshire, MD  01/07/2024 8:47 PM

## 2024-01-07 NOTE — ED Provider Notes (Signed)
 Care transferred to me.  Has been having pretty severe exertional chest pain and shortness of breath.  No pain or symptoms at rest.  D-dimer send given some tachycardia but this is negative.  Initial Troponin is negative.  Her symptoms are debilitating and so she will need admission.  Discussed with cardiology, Dr. Loni.  Cardiology will see but is recommending a hospitalist admission given normal troponin.  No heparin for now.   Freddi Hamilton, MD 01/07/24 2255

## 2024-01-07 NOTE — H&P (Signed)
 History and Physical    Tracy Fitzgerald FMW:[id number] DOB: August 21, 1965 DOA: 01/07/2024  PCP: Vicci Barnie NOVAK, MD  Patient coming from: Homeless  I have personally briefly reviewed patient's old medical records in [facility]  Chief Complaint: Chest pain  HPI: Tracy Fitzgerald is a 58 y.o. female with medical history significant for chronic HFpEF, aortic stenosis, T2DM, HTN, asthma, depression/anxiety, OSA, tobacco use, homeless who presented to the ED for evaluation of chest pain.  Patient has been following cardiology as an outpatient for evaluation of exertional dyspnea.  11/03/2023 Lexiscan  was normal, low risk.  Coronary artery calcifications were noted.  12/13/2023 TTE showed moderate-severe aortic stenosis.  TEE was recommended and has been scheduled for 01/11/2024.  Patient states that the middle of the night last night when she was walking back from the bathroom she developed severe central chest pain described as a crushing sensation with radiation down both arms.  This resolved on its own after short amount of time however she had several more similar episodes during the night occurring after exertion and then again today while at rest.  She has had associated shortness of breath.  Patient reports smoking 0.75 PPD, previously was smoking 3 PPD for many years.  ED Course  Labs/Imaging on admission: I have personally reviewed following labs and imaging studies.  Initial vitals showed BP 123/97, pulse 99, RR 20, temp 98.3 F, SpO2 98% on room air.  Labs showed WBC 8.5, hemoglobin 15.9, platelets 270, sodium 135, potassium 4.5, bicarb 21, BUN 18, creatinine 1.05, serum glucose 115, LFTs within normal limits, magnesium 1.6.  Troponin 8 > 10, BNP 15.2, D-dimer 0.34.  Portable chest x-ray showed basilar interstitial opacities.  Patient was given IV magnesium 2 g.  EDP discussed with cardiology who recommended medical admission and they will see in consultation.  The  hospitalist service was consulted for admission.  Review of Systems: All systems reviewed and are negative except as documented in history of present illness above.   Past Medical History:  Diagnosis Date   Aortic stenosis    Asthma    CHF (congestive heart failure) (HCC)    Coronary artery calcification seen on CT scan    Depression    Diabetes mellitus without complication (HCC)    Headache    Hypertension    LAFB (left anterior fascicular block)    Pain disorder    PTSD (post-traumatic stress disorder)    Sleep apnea    CPAP    Past Surgical History:  Procedure Laterality Date   ADENOIDECTOMY     COLONOSCOPY WITH PROPOFOL  N/A 05/24/2017   Procedure: COLONOSCOPY WITH PROPOFOL ;  Surgeon: Albertus Gordy HERO, MD;  Location: [facility];  Service: Gastroenterology;  Laterality: N/A;   TONSILLECTOMY      Social History: Patient reports smoking 0.75 PPD, previously was smoking 3 PPD for many years.  Allergies  Allergen Reactions   Cymbalta  [Duloxetine  Hcl]     Urinary retention    Family History  Problem Relation Age of Onset   Hypertension Mother    Diabetes Mother    Uterine cancer Mother    Heart disease Mother    Cancer Mother        UTERINE CANCER    Arthritis Mother    Depression Mother    Hypertension Father    Kidney disease Father    ADD / ADHD Son    ADD / ADHD Daughter    Breast cancer Neg Hx    Colon cancer  Neg Hx      Prior to Admission medications   Medication Sig Start Date End Date Taking? Authorizing Provider  Accu-Chek Softclix Lancets lancets Test blood sugar once a day 11/22/22   Vicci Barnie NOVAK, MD  albuterol  (VENTOLIN  HFA) 108 (90 Base) MCG/ACT inhaler INHALE 2 PUFFS INTO THE LUNGS EVERY 4 HOURS AS NEEDED FOR WHEEZING OR SHORTNESS OF BREATH 11/16/23   Vicci Barnie NOVAK, MD  ARIPiprazole  (ABILIFY ) 10 MG tablet Take 1 tablet (10 mg total) by mouth daily. 12/14/23   [place]aine LABOR  aspirin  EC 81 MG tablet Take 1 tablet (81 mg total) by mouth  daily. Swallow whole. 12/31/19   Vicci Barnie NOVAK, MD  atorvastatin  (LIPITOR) 40 MG tablet Take 1 tablet (40 mg total) by mouth daily. 12/06/23   Vicci Barnie NOVAK, MD  Blood Glucose Monitoring Suppl (ACCU-CHEK GUIDE) w/Device KIT Test blood sugar once a day 12/06/23   Vicci Barnie NOVAK, MD  buPROPion  (ZYBAN ) 150 MG 12 hr tablet TAKE 1 TABLET BY MOUTH TWICE A DAY 11/09/23   Vicci Barnie NOVAK, MD  Carboxymethylcellulose Sodium (EYE DROPS OP) Place 1 drop into both eyes daily as needed (dry eyes). Multi- purpose eye drops saline    [provider]  furosemide  (LASIX ) 40 MG tablet TAKE 1 TABLET BY MOUTH TWICE A DAY Patient taking differently: Take 40 mg by mouth daily. 12/21/23   Patwardhan, Newman PARAS, MD  gabapentin  (NEURONTIN ) 300 MG capsule Take 2 capsules (600 mg total) by mouth 3 (three) times daily AND 3 capsules (900 mg total) at bedtime. 06/08/23   Danton Jon HERO, PA-C  glucose blood (ACCU-CHEK GUIDE) test strip Test blood sugar once a day 11/22/22   Vicci Barnie NOVAK, MD  lidocaine  (LIDODERM ) 5 % PLACE 1 PATCH ONTO SKIN DAILY TO LOWER BACK, REMOVE AFTER 12 HOURS Patient taking differently: Place 1 patch onto the skin daily as needed (pain). 12/21/23   Vicci Barnie NOVAK, MD  lisinopril  (ZESTRIL ) 5 MG tablet Take 1 tablet (5 mg total) by mouth daily. 12/06/23   Vicci Barnie NOVAK, MD  meloxicam  (MOBIC ) 15 MG tablet Take 1 tablet (15 mg total) by mouth daily as needed for pain Patient taking differently: Take 15 mg by mouth daily. Daily 06/08/23   Danton Jon HERO, PA-C  metFORMIN  (GLUCOPHAGE -XR) 500 MG 24 hr tablet Take 1 tablet (500 mg total) by mouth 2 (two) times daily with a meal. 08/02/23   Vicci Barnie NOVAK, MD  mometasone-formoterol (DULERA) 100-5 MCG/ACT AERO Inhale 2 puffs into the lungs 2 (two) times daily. 12/20/23   Vicci Barnie NOVAK, MD  potassium chloride  SA (KLOR-CON  M) 20 MEQ tablet Take 1 tablet (20 mEq total) by mouth daily. 01/04/24   Carlin Delon BROCKS, NP   Semaglutide ,0.25 or 0.5MG /DOS, (OZEMPIC , 0.25 OR 0.5 MG/DOSE,) 2 MG/3ML SOPN Inject 0.25 mg into the skin once a week. Pharmacist to teach administration 12/06/23   Vicci Barnie NOVAK, MD  sertraline  (ZOLOFT ) 100 MG tablet Take 2 tablets (200 mg total) by mouth daily. 12/06/23   Vicci Barnie NOVAK, MD    Physical Exam: Vitals:   01/07/24 1445 01/07/24 1452 01/07/24 1515 01/07/24 1927  BP: 123/79     Pulse: (!) 101 96 85   Resp: 20  19   Temp:      TempSrc:      SpO2: 97% 96% 100%   Height:    5' 6 (1.676 m)   Constitutional: Sitting up in bed, NAD, calm, comfortable Eyes: EOMI,  lids and conjunctivae normal ENMT: Mucous membranes are moist. Posterior pharynx clear of any exudate or lesions.Normal dentition.  Neck: normal, supple, no masses. Respiratory: clear to auscultation bilaterally, no wheezing, no crackles. Normal respiratory effort. No accessory muscle use.  Cardiovascular: Regular rate and rhythm, systolic murmur. No extremity edema. 2+ pedal pulses. Abdomen: no tenderness, no masses palpated. Musculoskeletal: no clubbing / cyanosis. No joint deformity upper and lower extremities. Good ROM, no contractures. Normal muscle tone.  Skin: no rashes, lesions, ulcers. No induration Neurologic: Sensation intact. Strength 5/5 in all 4.  Psychiatric: Normal judgment and insight. Alert and oriented x 3. Normal mood.   EKG: Personally reviewed. Sinus tachycardia, rate 108, borderline prolonged PR interval, LAFB.  Similar to previous.  Assessment/Plan Principal Problem:   Chest pain Active Problems:   Hypertension associated with diabetes (HCC)   OSA (obstructive sleep apnea)   Type 2 diabetes, controlled, with peripheral neuropathy (HCC)   Hyperlipidemia associated with type 2 diabetes mellitus (HCC)   Chronic heart failure with preserved ejection fraction (HFpEF, >= 50%) (HCC)   Hypomagnesemia   Aortic stenosis   Angelin Cutrone is a 58 y.o. female with medical history significant  for chronic HFpEF, aortic stenosis, T2DM, HTN, asthma, depression/anxiety, OSA, tobacco use, homeless who is admitted for evaluation of chest pain.  Assessment and Plan: Chest pain: Patient presented with concerning chest pain, usually exertional but also reports an episode while at rest.  Troponin has been negative x 2.  EKG without any new significant ischemic changes.  Coronary artery calcifications were noted on CT imaging in August.  TTE 12/13/2023 showed mod-severe AS, EF 65-70%, no RWMA. - Cardiology consult pending - Keep on telemetry - Continue aspirin , atorvastatin   Moderate-severe aortic stenosis: Noted on outpatient TTE.  Patient is scheduled for TEE on 10/29.  Further evaluation as per cardiology.  Chronic HFpEF: Appears euvolemic.  Continue lisinopril  5 mg daily and PO Lasix  40 mg daily.  Hypomagnesemia: Supplementing.  Repeat labs in AM.  Type 2 diabetes: Hemoglobin A1c 9.0%.  Holding metformin , placed on SSI.  Hypertension: Continue lisinopril .  Depression/anxiety: Continue home Abilify , bupropion , sertraline .  Polycythemia: Mild and appears chronic.  Likely multifactorial from tobacco use and OSA.  OSA: Continue CPAP nightly.  Patient states her home CPAP machine broke 7 months ago and she is scheduled for another sleep study to obtain a new CPAP.  Tobacco use: Patient reports smoking 0.75 PPD.  Smoking cessation advised.  Nicotine  patch provided.   DVT prophylaxis: enoxaparin  (LOVENOX ) injection 40 mg Start: 01/07/24 1945 Code Status: Full code, confirmed with patient on admission Family Communication: Discussed with patient, she has discussed with family Disposition Plan: Pending clinical progress Consults called: Cardiology Severity of Illness: The appropriate patient status for this patient is OBSERVATION. Observation status is judged to be reasonable and necessary in order to provide the required intensity of service to ensure the patient's safety. The  patient's presenting symptoms, physical exam findings, and initial radiographic and laboratory data in the context of their medical condition is felt to place them at decreased risk for further clinical deterioration. Furthermore, it is anticipated that the patient will be medically stable for discharge from the hospital within 2 midnights of admission.   Jorie Blanch MD [facility]  If 7PM-7AM, please contact night-coverage [email or web address]  01/07/2024, 7:56 PM

## 2024-01-07 NOTE — ED Triage Notes (Signed)
 Pt BIB [facility] from home d/t CP th started last night. Intermittent. On the center- radiates to both of her arms. Denies n/v sob.not exerting makes it better.  324 aspirin  taken at home.   162/78 98% RA  HT 102  18 G RAC

## 2024-01-07 NOTE — ED Notes (Signed)
 Pt pocket knife turned into security.

## 2024-01-07 NOTE — Progress Notes (Signed)
 Overnight floor coverage  Informed by RN that patient is requesting her home gabapentin .  She takes 600 mg 3 times a day and additional 900 mg at bedtime.  Home medication ordered per patient request.

## 2024-01-07 NOTE — Hospital Course (Signed)
 Tracy Fitzgerald is a 58 y.o. female with medical history significant for chronic HFpEF, aortic stenosis, T2DM, HTN, asthma, depression/anxiety, OSA, tobacco use, homeless who is admitted for evaluation of chest pain.

## 2024-01-08 ENCOUNTER — Observation Stay (HOSPITAL_COMMUNITY)

## 2024-01-08 DIAGNOSIS — I350 Nonrheumatic aortic (valve) stenosis: Secondary | ICD-10-CM

## 2024-01-08 DIAGNOSIS — R079 Chest pain, unspecified: Secondary | ICD-10-CM | POA: Diagnosis not present

## 2024-01-08 DIAGNOSIS — I5032 Chronic diastolic (congestive) heart failure: Secondary | ICD-10-CM | POA: Diagnosis not present

## 2024-01-08 LAB — ECHOCARDIOGRAM COMPLETE
AR max vel: 0.81 cm2
AV Area VTI: 0.79 cm2
AV Area mean vel: 0.83 cm2
AV Mean grad: 25 mmHg
AV Peak grad: 42.3 mmHg
Ao pk vel: 3.25 m/s
Area-P 1/2: 3.72 cm2
Height: 66 in
S' Lateral: 3 cm
Weight: 4042.39 [oz_av]

## 2024-01-08 LAB — IRON AND TIBC
Iron: 67 ug/dL (ref 28–170)
Saturation Ratios: 18 % (ref 10.4–31.8)
TIBC: 372 ug/dL (ref 250–450)
UIBC: 305 ug/dL

## 2024-01-08 LAB — CBC
HCT: 46.6 % — ABNORMAL HIGH (ref 36.0–46.0)
Hemoglobin: 15.6 g/dL — ABNORMAL HIGH (ref 12.0–15.0)
MCH: 30.8 pg (ref 26.0–34.0)
MCHC: 33.5 g/dL (ref 30.0–36.0)
MCV: 91.9 fL (ref 80.0–100.0)
Platelets: 267 K/uL (ref 150–400)
RBC: 5.07 MIL/uL (ref 3.87–5.11)
RDW: 13.3 % (ref 11.5–15.5)
WBC: 8.2 K/uL (ref 4.0–10.5)
nRBC: 0 % (ref 0.0–0.2)

## 2024-01-08 LAB — FERRITIN: Ferritin: 108 ng/mL (ref 11–307)

## 2024-01-08 LAB — BASIC METABOLIC PANEL WITH GFR
Anion gap: 10 (ref 5–15)
BUN: 14 mg/dL (ref 6–20)
CO2: 24 mmol/L (ref 22–32)
Calcium: 9.4 mg/dL (ref 8.9–10.3)
Chloride: 103 mmol/L (ref 98–111)
Creatinine, Ser: 0.92 mg/dL (ref 0.44–1.00)
GFR, Estimated: >60 mL/min (ref >60)
Glucose, Bld: 128 mg/dL — ABNORMAL HIGH (ref 70–99)
Potassium: 3.7 mmol/L (ref 3.5–5.1)
Sodium: 137 mmol/L (ref 135–145)

## 2024-01-08 LAB — GLUCOSE, CAPILLARY
Glucose-Capillary: 170 mg/dL — ABNORMAL HIGH (ref 70–99)
Glucose-Capillary: 231 mg/dL — ABNORMAL HIGH (ref 70–99)
Glucose-Capillary: 128 mg/dL — ABNORMAL HIGH (ref 70–99)
Glucose-Capillary: 155 mg/dL — ABNORMAL HIGH (ref 70–99)

## 2024-01-08 LAB — HIV ANTIBODY (ROUTINE TESTING W REFLEX): HIV Screen 4th Generation wRfx: NONREACTIVE

## 2024-01-08 LAB — MAGNESIUM: Magnesium: 1.9 mg/dL (ref 1.7–2.4)

## 2024-01-08 MED ORDER — SODIUM CHLORIDE 0.9 % IV SOLN: INTRAVENOUS | Status: DC

## 2024-01-08 MED ORDER — ATORVASTATIN CALCIUM 80 MG PO TABS
80.0000 mg | ORAL_TABLET | Freq: Every day | ORAL | Status: DC
Start: 2024-01-09 — End: 2024-01-10
  Administered 2024-01-09 – 2024-01-10 (×2): 80 mg via ORAL
  Filled 2024-01-08 (×2): qty 1

## 2024-01-08 NOTE — Progress Notes (Signed)
 Progress Note   Patient: Tracy Fitzgerald FMW:[id number] DOB: December 07, 1965 DOA: 01/07/2024     0 DOS: the patient was seen and examined on 01/08/2024   Brief hospital course: The patient is a 58 yr old woman who presented to [facility] ED on 01/07/2024 with complaints of chest pain. She stated that it started in the middle of last night when she was going to the bathroom. It was severe, sharp, located in the center of her chest. She describes it as crushing with radiation down both arms. This episode was self limited, but it recurred several times with exertion and then again while at rest. The episodes were associated with shortness of breath.   She has a past medical history significant for chronic HFpEF, AS, DM Type II, asthma, depression/anxiety, OSA, tobacco use ( 0.75 PPD currently, used to smoke 3 ppd). She has been following with cardiology as outpatient due to exertional dyspnea. Thsu far that investigation has yielded: 11/03/2023 Lexiscan  was normal, low risk.  Coronary artery calcifications were noted.   12/13/2023 TTE showed moderate-severe aortic stenosis.  TEE was recommended and has been scheduled for 01/11/2024.  In the ED she was found to have troponin 8>1, CXR demonstrating interstitial opacities, Magnesium of 1.6.  Magnesium was supplemented.  The patient was admitted to a telemetry bed. Cardiology has been consutled. The patient will undergo TEE tomorrow to further investigate aortic stenosis. She will also undergo a coronary CTA.  Assessment and Plan: Chest pain: Patient presented with concerning chest pain, usually exertional but also reports an episode while at rest.  Troponin has been negative x 2.  EKG without any new significant ischemic changes.  Coronary artery calcifications were noted on CT imaging in August.  TTE 12/13/2023 showed mod-severe AS, EF 65-70%, no RWMA. - Cardiology consult pending - Keep on telemetry - Continue aspirin , atorvastatin    Moderate-severe aortic  stenosis: Noted on outpatient TTE.  Patient is scheduled for TEE on 10/29.  This will now be performed on 01/09/2024.   Chronic HFpEF: Appears euvolemic.  Continue lisinopril  5 mg daily and PO Lasix  40 mg daily.   Hypomagnesemia: Supplementing.  Repeat labs in AM.   Type 2 diabetes: Hemoglobin A1c 9.0%.  Holding metformin , placed on SSI.   Hypertension: Continue lisinopril .   Depression/anxiety: Continue home Abilify , bupropion , sertraline .   Polycythemia: Mild and appears chronic.  Likely multifactorial from tobacco use and OSA.   OSA: Continue CPAP nightly.  Patient states her home CPAP machine broke 7 months ago and she is scheduled for another sleep study to obtain a new CPAP.   Tobacco use: Patient reports smoking 0.75 PPD.  Smoking cessation advised.  Nicotine  patch provided.   DVT prophylaxis: enoxaparin  (LOVENOX ) injection 40 mg Start: 01/07/24 1945 Code Status: Full code, confirmed with patient on admission Family Communication: Discussed with patient, she has discussed with family Disposition Plan: Pending clinical progress Consults called: Cardiology  Subjective: The patient is resting comfortably. No new complaints.   Physical Exam: Vitals:   01/08/24 0748 01/08/24 0801 01/08/24 1139 01/08/24 1649  BP: (!) 134/91  107/89 98/82  Pulse: 89 91 100 97  Resp: 20 (!) 21 20 20   Temp: 98.5 F (36.9 C)  99.6 F (37.6 C) 98.9 F (37.2 C)  TempSrc: Oral  Oral Oral  SpO2: 96% 98% 95% 97%  Weight:      Height:       Exam:  Constitutional:  The patient is awake, alert, and oriented x 3. No acute distress.  Respiratory:  No increased work of breathing. No wheezes, rales, or rhonchi No tactile fremitus Cardiovascular:  Regular rate and rhythm No murmurs, ectopy, or gallups. No lateral PMI. No thrills. Abdomen:  Abdomen is soft, non-tender, non-distended No hernias, masses, or organomegaly Normoactive bowel sounds.  Musculoskeletal:  No cyanosis, clubbing,  or edema Skin:  No rashes, lesions, ulcers palpation of skin: no induration or nodules Neurologic:  CN 2-12 intact Sensation all 4 extremities intact Psychiatric:  Mental status Mood, affect appropriate Orientation to person, place, time  judgment and insight appear intact  Data Reviewed:  EKG, CMP, TTE  Family Communication: None available  Disposition: Status is: Observation The patient remains OBS appropriate and will d/c before 2 midnights.  Planned Discharge Destination: Home    Time spent: 38 minutes  Author: Tommy Minichiello, DO 01/08/2024 6:26 PM  For on call review [email or web address].

## 2024-01-08 NOTE — Progress Notes (Signed)
   01/08/24 2030  BiPAP/CPAP/SIPAP  $ Non-Invasive Ventilator  Non-Invasive Vent Initial  $ Face Mask Large  Yes  BiPAP/CPAP/SIPAP Pt Type Adult  BiPAP/CPAP/SIPAP Resmed  Mask Type Full face mask  Dentures removed? Not applicable  Mask Size Large  EPAP 8 cmH2O  FiO2 (%) 21 %  Flow Rate 0 lpm  Patient Home Machine No  Patient Home Mask No  Patient Home Tubing No  Auto Titrate No  CPAP/SIPAP surface wiped down Yes  Device Plugged into RED Power Outlet Yes

## 2024-01-08 NOTE — Progress Notes (Signed)
 Echocardiogram 2D Echocardiogram has been performed.  Tracy Fitzgerald 01/08/2024, 4:08 PM

## 2024-01-08 NOTE — Progress Notes (Signed)
 Cardiology Progress Note  Patient ID: Tracy Fitzgerald MRN: [id number] DOB: 1965-08-01 Date of Encounter: 01/08/2024 Primary Cardiologist: Newman JINNY Lawrence, MD  Subjective   Chief Complaint: Chest pain  HPI: No further chest pain.  Concerns for severe aortic stenosis on admission.  Patient is currently homeless.  ROS:  All other ROS reviewed and negative. Pertinent positives noted in the HPI.     Telemetry  Overnight telemetry shows sinus rhythm 90s, which I personally reviewed.   ECG  The most recent ECG shows tachycardia rate 108, no acute ischemic changes, which I personally reviewed.   Physical Exam   Vitals:   01/08/24 0258 01/08/24 0433 01/08/24 0748 01/08/24 0801  BP:  124/75 (!) 134/91   Pulse:  75 89 91  Resp:  18 20 (!) 21  Temp:  98 F (36.7 C) 98.5 F (36.9 C)   TempSrc:  Oral Oral   SpO2:  94% 96% 98%  Weight: 114.6 kg     Height:        Intake/Output Summary (Last 24 hours) at 01/08/2024 0912 Last data filed at 01/08/2024 0431 Gross per 24 hour  Intake 480 ml  Output 950 ml  Net -470 ml       01/08/2024    2:58 AM 01/07/2024    9:00 PM 01/04/2024    1:18 PM  Last 3 Weights  Weight (lbs) 252 lb 10.4 oz 252 lb 10.4 oz 258 lb  Weight (kg) 114.601 kg 114.601 kg 117.028 kg    Body mass index is 40.78 kg/m.  General: Well nourished, well developed, in no acute distress Head: Atraumatic, normal size  Eyes: PEERLA, EOMI  Neck: Supple, no JVD Endocrine: No thryomegaly Cardiac: Normal S1, S2; RRR; 2 out of 6 systolic ejection murmur Lungs: Clear to auscultation bilaterally, no wheezing, rhonchi or rales  Abd: Soft, nontender, no hepatomegaly  Ext: No edema, pulses 2+ Musculoskeletal: No deformities, BUE and BLE strength normal and equal Skin: Warm and dry, no rashes   Neuro: Alert and oriented to person, place, time, and situation, CNII-XII grossly intact, no focal deficits  Psych: Normal mood and affect   Cardiac Studies  TTE 12/13/2023  1.  Poor interrogation of the aortic valve, not well visualized. V max 3.6  m/s, 36 mmHG, AVA 1.01 cm2, DI 0.24. Concerns for moderate to severe  aortic stenosis, possibly severe. Would recommend a TEE for better  characterization. The aortic valve was not  well visualized. Aortic valve regurgitation is mild. Moderate to severe  aortic valve stenosis. Aortic valve area, by VTI measures 1.01 cm. Aortic  valve mean gradient measures 36.0 mmHg. Aortic valve Vmax measures 3.62  m/s.   2. Left ventricular ejection fraction, by estimation, is 65 to 70%. The  left ventricle has normal function. The left ventricle has no regional  wall motion abnormalities. Left ventricular diastolic parameters were  normal.   3. Right ventricular systolic function is normal. The right ventricular  size is normal. There is normal pulmonary artery systolic pressure. The  estimated right ventricular systolic pressure is 9.2 mmHg.   4. The mitral valve is grossly normal. Trivial mitral valve  regurgitation. No evidence of mitral stenosis.   5. The inferior vena cava is normal in size with greater than 50%  respiratory variability, suggesting right atrial pressure of 3 mmHg.   Patient Profile  Tracy Fitzgerald is a 58 y.o. female with HFpEF, diabetes, hypertension, OSA, tobacco abuse, homelessness admitted on 01/07/2024 for chest pain.  Assessment &  Plan   # Acute chest pain # Moderate to severe aortic stenosis - Evaluated in our office for chest pain symptoms.  Echo shows possible moderate to severe aortic stenosis.  Poor interrogation and poor quality study noted. - Troponins are negative.  EKG is unremarkable. - We will proceed with transesophageal echo tomorrow.  Will also do coronary CTA.   - On exam murmur is not consistent with severe aortic stenosis.  We will get a better look at things on transesophageal echo. -Low suspicion for coronary disease as well.  Coronary CTA should clarify this. -Noted to have  coronary calcium  on recent stress imaging.  Should continue aspirin  and statin.  Informed Consent   Shared Decision Making/Informed Consent   The risks [esophageal damage, perforation (1:10,000 risk), bleeding, pharyngeal hematoma as well as other potential complications associated with conscious sedation including aspiration, arrhythmia, respiratory failure and death], benefits (treatment guidance and diagnostic support) and alternatives of a transesophageal echocardiogram were discussed in detail with Ms. Makin and she is willing to proceed.         For questions or updates, please contact [facility] Please consult [email or web address] for contact info under         Signed, Darryle T. Barbaraann, MD, [facility]  [facility]  01/08/2024 9:12 AM

## 2024-01-08 NOTE — Plan of Care (Signed)

## 2024-01-09 ENCOUNTER — Ambulatory Visit: Admitting: Pharmacist

## 2024-01-09 ENCOUNTER — Observation Stay (HOSPITAL_COMMUNITY)

## 2024-01-09 ENCOUNTER — Observation Stay (HOSPITAL_COMMUNITY): Admitting: Anesthesiology

## 2024-01-09 ENCOUNTER — Encounter (HOSPITAL_COMMUNITY): Admission: EM | Disposition: A | Payer: Self-pay | Source: Home / Self Care | Attending: Emergency Medicine

## 2024-01-09 DIAGNOSIS — I10 Essential (primary) hypertension: Secondary | ICD-10-CM

## 2024-01-09 DIAGNOSIS — R079 Chest pain, unspecified: Secondary | ICD-10-CM | POA: Diagnosis not present

## 2024-01-09 DIAGNOSIS — I350 Nonrheumatic aortic (valve) stenosis: Secondary | ICD-10-CM

## 2024-01-09 DIAGNOSIS — I5032 Chronic diastolic (congestive) heart failure: Secondary | ICD-10-CM | POA: Diagnosis not present

## 2024-01-09 DIAGNOSIS — F418 Other specified anxiety disorders: Secondary | ICD-10-CM | POA: Diagnosis not present

## 2024-01-09 DIAGNOSIS — I110 Hypertensive heart disease with heart failure: Secondary | ICD-10-CM | POA: Diagnosis not present

## 2024-01-09 DIAGNOSIS — I2510 Atherosclerotic heart disease of native coronary artery without angina pectoris: Secondary | ICD-10-CM | POA: Diagnosis not present

## 2024-01-09 DIAGNOSIS — I509 Heart failure, unspecified: Secondary | ICD-10-CM | POA: Diagnosis not present

## 2024-01-09 DIAGNOSIS — F17210 Nicotine dependence, cigarettes, uncomplicated: Secondary | ICD-10-CM | POA: Diagnosis not present

## 2024-01-09 LAB — BASIC METABOLIC PANEL WITH GFR
Anion gap: 10 (ref 5–15)
BUN: 14 mg/dL (ref 6–20)
CO2: 26 mmol/L (ref 22–32)
Calcium: 9.3 mg/dL (ref 8.9–10.3)
Chloride: 100 mmol/L (ref 98–111)
Creatinine, Ser: 1.03 mg/dL — ABNORMAL HIGH (ref 0.44–1.00)
GFR, Estimated: >60 mL/min (ref >60)
Glucose, Bld: 140 mg/dL — ABNORMAL HIGH (ref 70–99)
Potassium: 4.2 mmol/L (ref 3.5–5.1)
Sodium: 136 mmol/L (ref 135–145)

## 2024-01-09 LAB — ECHO TEE
AR max vel: 1.34 cm2
AV Area VTI: 1.32 cm2
AV Area mean vel: 1.35 cm2
AV Mean grad: 13 mmHg
AV Peak grad: 21.7 mmHg
Ao pk vel: 2.33 m/s

## 2024-01-09 LAB — CBC WITH DIFFERENTIAL/PLATELET
Abs Immature Granulocytes: 0.03 K/uL (ref 0.00–0.07)
Basophils Absolute: 0.1 K/uL (ref 0.0–0.1)
Basophils Relative: 1 %
Eosinophils Absolute: 0.2 K/uL (ref 0.0–0.5)
Eosinophils Relative: 3 %
HCT: 47.1 % — ABNORMAL HIGH (ref 36.0–46.0)
Hemoglobin: 15.3 g/dL — ABNORMAL HIGH (ref 12.0–15.0)
Immature Granulocytes: 0 %
Lymphocytes Relative: 27 %
Lymphs Abs: 1.9 K/uL (ref 0.7–4.0)
MCH: 30.4 pg (ref 26.0–34.0)
MCHC: 32.5 g/dL (ref 30.0–36.0)
MCV: 93.6 fL (ref 80.0–100.0)
Monocytes Absolute: 0.7 K/uL (ref 0.1–1.0)
Monocytes Relative: 10 %
Neutro Abs: 4.3 K/uL (ref 1.7–7.7)
Neutrophils Relative %: 59 %
Platelets: 255 K/uL (ref 150–400)
RBC: 5.03 MIL/uL (ref 3.87–5.11)
RDW: 13.4 % (ref 11.5–15.5)
WBC: 7.2 K/uL (ref 4.0–10.5)
nRBC: 0 % (ref 0.0–0.2)

## 2024-01-09 LAB — GLUCOSE, CAPILLARY
Glucose-Capillary: 153 mg/dL — ABNORMAL HIGH (ref 70–99)
Glucose-Capillary: 138 mg/dL — ABNORMAL HIGH (ref 70–99)
Glucose-Capillary: 138 mg/dL — ABNORMAL HIGH (ref 70–99)
Glucose-Capillary: 148 mg/dL — ABNORMAL HIGH (ref 70–99)

## 2024-01-09 SURGERY — TRANSESOPHAGEAL ECHOCARDIOGRAM (TEE) (CATHLAB)
Anesthesia: Monitor Anesthesia Care

## 2024-01-09 MED ORDER — SODIUM CHLORIDE 0.9% FLUSH
10.0000 mL | Freq: Two times a day (BID) | INTRAVENOUS | Status: DC
  Administered 2024-01-09 – 2024-01-10 (×3): 10 mL

## 2024-01-09 MED ORDER — DILTIAZEM HCL 25 MG/5ML IV SOLN: 10.0000 mg | INTRAVENOUS | Status: DC | PRN

## 2024-01-09 MED ORDER — PHENYLEPHRINE 80 MCG/ML (10ML) SYRINGE FOR IV PUSH (FOR BLOOD PRESSURE SUPPORT)
PREFILLED_SYRINGE | INTRAVENOUS | Status: DC | PRN
  Administered 2024-01-09: 80 ug via INTRAVENOUS

## 2024-01-09 MED ORDER — PROPOFOL 500 MG/50ML IV EMUL
INTRAVENOUS | Status: DC | PRN
  Administered 2024-01-09: 100 ug/kg/min via INTRAVENOUS

## 2024-01-09 MED ORDER — NITROGLYCERIN 0.4 MG SL SUBL
SUBLINGUAL_TABLET | SUBLINGUAL | Status: AC
  Filled 2024-01-09: qty 2

## 2024-01-09 MED ORDER — IOHEXOL 350 MG/ML SOLN
190.0000 mL | Freq: Once | INTRAVENOUS | Status: AC | PRN
  Administered 2024-01-09: 190 mL via INTRAVENOUS

## 2024-01-09 MED ORDER — METOPROLOL TARTRATE 5 MG/5ML IV SOLN
10.0000 mg | INTRAVENOUS | Status: DC | PRN
  Administered 2024-01-09: 5 mg via INTRAVENOUS

## 2024-01-09 MED ORDER — SODIUM CHLORIDE 0.9% FLUSH: 10.0000 mL | INTRAVENOUS | Status: DC | PRN

## 2024-01-09 MED ORDER — IVABRADINE HCL 5 MG PO TABS
15.0000 mg | ORAL_TABLET | Freq: Once | ORAL | Status: AC
  Administered 2024-01-09: 15 mg via ORAL
  Filled 2024-01-09 (×2): qty 3

## 2024-01-09 MED ORDER — PHENYLEPHRINE HCL-NACL 20-0.9 MG/250ML-% IV SOLN
INTRAVENOUS | Status: DC | PRN
  Administered 2024-01-09: 40 ug/min via INTRAVENOUS

## 2024-01-09 MED ORDER — BUTAMBEN-TETRACAINE-BENZOCAINE 2-2-14 % EX AERO
INHALATION_SPRAY | CUTANEOUS | Status: DC | PRN
  Administered 2024-01-09: 2 via TOPICAL

## 2024-01-09 MED ORDER — SODIUM CHLORIDE 0.9 % IV SOLN: INTRAVENOUS | Status: DC | PRN

## 2024-01-09 MED ORDER — PROPOFOL 10 MG/ML IV BOLUS
INTRAVENOUS | Status: DC | PRN
  Administered 2024-01-09 (×2): 40 mg via INTRAVENOUS

## 2024-01-09 MED ORDER — METOPROLOL TARTRATE 5 MG/5ML IV SOLN
INTRAVENOUS | Status: AC
  Filled 2024-01-09: qty 10

## 2024-01-09 MED ORDER — NITROGLYCERIN 0.4 MG SL SUBL
0.8000 mg | SUBLINGUAL_TABLET | Freq: Once | SUBLINGUAL | Status: AC
  Administered 2024-01-09: 0.8 mg via SUBLINGUAL

## 2024-01-09 MED ORDER — LIDOCAINE 2% (20 MG/ML) 5 ML SYRINGE
INTRAMUSCULAR | Status: DC | PRN
  Administered 2024-01-09: 100 mg via INTRAVENOUS

## 2024-01-09 NOTE — Anesthesia Preprocedure Evaluation (Addendum)
 Anesthesia Evaluation  Patient identified by MRN, date of birth, ID band Patient awake    Reviewed: Allergy & Precautions, NPO status , Patient's Chart, lab work & pertinent test results  History of Anesthesia Complications Negative for: history of anesthetic complications  Airway Mallampati: II  TM Distance: >3 FB Neck ROM: Full    Dental  (+) Loose, Dental Advisory Given, Missing, Poor Dentition   Pulmonary asthma , sleep apnea , Current Smoker   breath sounds clear to auscultation       Cardiovascular hypertension, + CAD and +CHF  + dysrhythmias (LAFB) + Valvular Problems/Murmurs AS  Rhythm:Irregular Rate:Tachycardia  TTE (2025): 1. At least moderate aortic stenosis. Vmax 3.25 m/s, MG 25 mmHG, AVA 0.79  cm2, DI 0.25. Planned for better characterize severity of AS. The aortic  valve was not well visualized. Aortic valve regurgitation is mild.  Moderate to severe aortic valve  stenosis. Aortic valve area, by VTI measures 0.79 cm. Aortic valve mean  gradient measures 25.0 mmHg. Aortic valve Vmax measures 3.25 m/s.   2. Left ventricular ejection fraction, by estimation, is 55 to 60%. The  left ventricle has normal function. The left ventricle has no regional  wall motion abnormalities. Left ventricular diastolic parameters are  consistent with Grade I diastolic  dysfunction (impaired relaxation).   3. Right ventricular systolic function is normal. The right ventricular  size is normal. Tricuspid regurgitation signal is inadequate for assessing  PA pressure.   4. The mitral valve is grossly normal. Trivial mitral valve  regurgitation. No evidence of mitral stenosis.   5. The inferior vena cava is dilated in size with >50% respiratory  variability, suggesting right atrial pressure of 8 mmHg.      Neuro/Psych   Anxiety Depression       GI/Hepatic   Endo/Other  diabetes, Type 2    Renal/GU      Musculoskeletal  (+)  Arthritis ,    Abdominal   Peds  Hematology   Anesthesia Other Findings   Reproductive/Obstetrics                              Anesthesia Physical Anesthesia Plan  ASA: 3  Anesthesia Plan: MAC   Post-op Pain Management:    Induction: Intravenous  PONV Risk Score and Plan: 1 and Propofol  infusion and Treatment may vary due to age or medical condition  Airway Management Planned: Natural Airway and Nasal Cannula  Additional Equipment:   Intra-op Plan:   Post-operative Plan:   Informed Consent:      Dental advisory given  Plan Discussed with: CRNA and Surgeon  Anesthesia Plan Comments:          Anesthesia Quick Evaluation

## 2024-01-09 NOTE — Anesthesia Postprocedure Evaluation (Signed)
 Anesthesia Post Note  Patient: Tracy Fitzgerald  Procedure(s) Performed: TRANSESOPHAGEAL ECHOCARDIOGRAM     Patient location during evaluation: Other Anesthesia Type: MAC Level of consciousness: awake and alert Pain management: pain level controlled Vital Signs Assessment: post-procedure vital signs reviewed and stable Respiratory status: spontaneous breathing Cardiovascular status: blood pressure returned to baseline Postop Assessment: no apparent nausea or vomiting Anesthetic complications: no   There were no known notable events for this encounter.  Last Vitals:  Vitals:   01/09/24 1430 01/09/24 1555  BP: 111/76 132/85  Pulse: 76 76  Resp: 18 20  Temp:  36.8 C  SpO2: 96% 96%    Last Pain:  Vitals:   01/09/24 1555  TempSrc: Oral  PainSc:                  [name]

## 2024-01-09 NOTE — Discharge Instructions (Signed)

## 2024-01-09 NOTE — Interval H&P Note (Signed)
 History and Physical Interval Note:  01/09/2024 1:39 PM  Tracy Fitzgerald  has presented today for surgery, with the diagnosis of Aortic valve stenosis.  The various methods of treatment have been discussed with the patient and family. After consideration of risks, benefits and other options for treatment, the patient has consented to  Procedure(s): TRANSESOPHAGEAL ECHOCARDIOGRAM (N/A) as a surgical intervention.  The patient's history has been reviewed, patient examined, no change in status, stable for surgery.  I have reviewed the patient's chart and labs.  Questions were answered to the patient's satisfaction.     Tracy Fitzgerald H Azobou Tonleu

## 2024-01-09 NOTE — Progress Notes (Signed)
  Echocardiogram Echocardiogram Transesophageal has been performed.  Tracy Fitzgerald 01/09/2024, 2:22 PM

## 2024-01-09 NOTE — Progress Notes (Incomplete)
 Progress Note   Patient: Tracy Fitzgerald FMW:[id number] DOB: 10/24/1965 DOA: 01/07/2024     0 DOS: the patient was seen and examined on 01/09/2024   Brief hospital course: The patient is a 58 yr old woman who presented to [facility] ED on 01/07/2024 with complaints of chest pain. She stated that it started in the middle of last night when she was going to the bathroom. It was severe, sharp, located in the center of her chest. She describes it as crushing with radiation down both arms. This episode was self limited, but it recurred several times with exertion and then again while at rest. The episodes were associated with shortness of breath.   She has a past medical history significant for chronic HFpEF, AS, DM Type II, asthma, depression/anxiety, OSA, tobacco use ( 0.75 PPD currently, used to smoke 3 ppd). She has been following with cardiology as outpatient due to exertional dyspnea. Thsu far that investigation has yielded: 11/03/2023 Lexiscan  was normal, low risk.  Coronary artery calcifications were noted.   12/13/2023 TTE showed moderate-severe aortic stenosis.  TEE was recommended and has been scheduled for 01/11/2024.  In the ED she was found to have troponin 8>1, CXR demonstrating interstitial opacities, Magnesium of 1.6.  Magnesium was supplemented.  The patient was admitted to a telemetry bed. Cardiology has been consutled. The patient will undergo TEE tomorrow to further investigate aortic stenosis. She will also undergo a coronary CTA.  Assessment and Plan: Chest pain: Patient presented with concerning chest pain, usually exertional but also reports an episode while at rest.  Troponin has been negative x 2.  EKG without any new significant ischemic changes.  Coronary artery calcifications were noted on CT imaging in August.  TTE 12/13/2023 showed mod-severe AS, EF 65-70%, no RWMA. - Cardiology consult pending - Keep on telemetry - Continue aspirin , atorvastatin    Moderate-severe aortic  stenosis: Noted on outpatient TTE.  Patient is scheduled for TEE on 10/29.  This will now be performed on 01/09/2024.   Chronic HFpEF: Appears euvolemic.  Continue lisinopril  5 mg daily and PO Lasix  40 mg daily.   Hypomagnesemia: Supplementing.  Repeat labs in AM.   Type 2 diabetes: Hemoglobin A1c 9.0%.  Holding metformin , placed on SSI.   Hypertension: Continue lisinopril .   Depression/anxiety: Continue home Abilify , bupropion , sertraline .   Polycythemia: Mild and appears chronic.  Likely multifactorial from tobacco use and OSA.   OSA: Continue CPAP nightly.  Patient states her home CPAP machine broke 7 months ago and she is scheduled for another sleep study to obtain a new CPAP.   Tobacco use: Patient reports smoking 0.75 PPD.  Smoking cessation advised.  Nicotine  patch provided.   DVT prophylaxis: enoxaparin  (LOVENOX ) injection 40 mg Start: 01/07/24 1945 Code Status: Full code, confirmed with patient on admission Family Communication: Discussed with patient, she has discussed with family Disposition Plan: Pending clinical progress Consults called: Cardiology  Subjective: The patient is resting comfortably. No new complaints.   Physical Exam: Vitals:   01/09/24 1416 01/09/24 1420 01/09/24 1430 01/09/24 1555  BP: 106/72 111/76 111/76 132/85  Pulse: 70 70 76 76  Resp: 18 10 18 20   Temp:    98.2 F (36.8 C)  TempSrc:    Oral  SpO2: 98% 98% 96% 96%  Weight:      Height:       Exam:  Constitutional:  The patient is awake, alert, and oriented x 3. No acute distress. Respiratory:  No increased work of breathing. No  wheezes, rales, or rhonchi No tactile fremitus Cardiovascular:  Regular rate and rhythm No murmurs, ectopy, or gallups. No lateral PMI. No thrills. Abdomen:  Abdomen is soft, non-tender, non-distended No hernias, masses, or organomegaly Normoactive bowel sounds.  Musculoskeletal:  No cyanosis, clubbing, or edema Skin:  No rashes, lesions,  ulcers palpation of skin: no induration or nodules Neurologic:  CN 2-12 intact Sensation all 4 extremities intact Psychiatric:  Mental status Mood, affect appropriate Orientation to person, place, time  judgment and insight appear intact  Data Reviewed:  EKG, CMP, TTE  Family Communication: None available  Disposition: Status is: Observation The patient remains OBS appropriate and will d/c before 2 midnights.  Planned Discharge Destination: Home {Tip this will not be part of the note when signed  DVT Prophylaxis  .,  (Optional):26781}   Time spent: 38 minutes  Author: Iceis Knab, DO 01/09/2024 6:59 PM  For on call review [email or web address].

## 2024-01-09 NOTE — Progress Notes (Signed)
 A midline has been ordered for this patient. The VAST RN has reviewed the patient's medical record including any arm restrictions, current creatinine clearance, length IV therapy is needed, and infusions needed/ordered to determine if a midline is the appropriate line for this individual patient. If there are contraindications, the physician and primary RN have been contacted by VAST RN for further discussion. Midline Education: a midline is a long peripheral IV placed in the upper arm with the tip located at or near the axilla and distal to the shoulder should not be used as a CLABSI preventative measure   has one lumen only can remain in place for up to 29 days  Safe for Vancomycin infusion LESS THAN 6 days Safe for power injection if good blood return can be obtained and line flushes easily CANNOT be used for continuous infusion of vesicants. Including TPN and chemotherapy Should NOT be placed for sole intent of obtaining labs as there is no guarantee blood can be successfully drawn from line  contraindicated in patients with thrombosis, hypercoagulability, decreased venous flow to the extremities, ESRD (without a nephrologist's approval), small vessels, allergy to polyurethane, or known/suspected presence of a device-related infection, bacteremia, or septicemia

## 2024-01-09 NOTE — Progress Notes (Signed)
 Cardiology Progress Note  Patient ID: Tracy Fitzgerald MRN: [id number] DOB: [birth year] Date of Encounter: 01/09/2024 Primary Cardiologist: Tracy JINNY Lawrence, MD  Subjective   Chief Complaint: None.   HPI: Denies chest pain or trouble breathing.  Plan for TEE and coronary CTA today.  ROS:  All other ROS reviewed and negative. Pertinent positives noted in the HPI.     Telemetry  Overnight telemetry shows sinus rhythm 80s, which I personally reviewed.    Physical Exam   Vitals:   01/09/24 0434 01/09/24 0436 01/09/24 0736 01/09/24 0748  BP: 123/73  105/73   Pulse: 75  82 78  Resp: 20  20 16   Temp: 97.9 F (36.6 C)  98.8 F (37.1 C)   TempSrc: Oral  Oral   SpO2: 93%  96% 96%  Weight:  114.2 kg    Height:        Intake/Output Summary (Last 24 hours) at 01/09/2024 1007 Last data filed at 01/09/2024 0738 Gross per 24 hour  Intake 897 ml  Output 2150 ml  Net -1253 ml       01/09/2024    4:36 AM 01/08/2024    2:58 AM 01/07/2024    9:00 PM  Last 3 Weights  Weight (lbs) 251 lb 12.8 oz 252 lb 10.4 oz 252 lb 10.4 oz  Weight (kg) 114.216 kg 114.601 kg 114.601 kg    Body mass index is 40.64 kg/m.  General: Well nourished, well developed, in no acute distress Head: Atraumatic, normal size  Eyes: PEERLA, EOMI  Neck: Supple, no JVD Endocrine: No thryomegaly Cardiac: Normal S1, S2; RRR; 2 out of 6 systolic ejection murmur Lungs: Clear to auscultation bilaterally, no wheezing, rhonchi or rales  Abd: Soft, nontender, no hepatomegaly  Ext: No edema, pulses 2+ Musculoskeletal: No deformities, BUE and BLE strength normal and equal Skin: Warm and dry, no rashes   Neuro: Alert and oriented to person, place, time, and situation, CNII-XII grossly intact, no focal deficits  Psych: Normal mood and affect   Cardiac Studies  TTE 01/08/2024  1. At least moderate aortic stenosis. Vmax 3.25 m/s, MG 25 mmHG, AVA 0.79  cm2, DI 0.25. Planned for better characterize severity of AS. The  aortic  valve was not well visualized. Aortic valve regurgitation is mild.  Moderate to severe aortic valve  stenosis. Aortic valve area, by VTI measures 0.79 cm. Aortic valve mean  gradient measures 25.0 mmHg. Aortic valve Vmax measures 3.25 m/s.   2. Left ventricular ejection fraction, by estimation, is 55 to 60%. The  left ventricle has normal function. The left ventricle has no regional  wall motion abnormalities. Left ventricular diastolic parameters are  consistent with Grade I diastolic  dysfunction (impaired relaxation).   3. Right ventricular systolic function is normal. The right ventricular  size is normal. Tricuspid regurgitation signal is inadequate for assessing  PA pressure.   4. The mitral valve is grossly normal. Trivial mitral valve  regurgitation. No evidence of mitral stenosis.   5. The inferior vena cava is dilated in size with >50% respiratory  variability, suggesting right atrial pressure of 8 mmHg.   Patient Profile  Tracy Fitzgerald is a 58 y.o. female with HFpEF, diabetes, hypertension, OSA, tobacco abuse, homelessness admitted on 01/07/2024 with chest pains and concern for severe aortic stenosis.  Assessment & Plan   # Chest pain, possibly cardiac # Moderate to severe aortic stenosis - Admitted with chest pain symptoms that have been worsening.  Echo shows possible moderate to severe  aortic stenosis.  Murmur inconsistent with severe aortic stenosis. - Troponins negative.  EKG nonischemic. - Plans for transesophageal echo and coronary CTA to evaluate her valve and coronary arteries today. - We should have an answer today. - Continue aspirin  and statin in the interim.  Informed Consent   Shared Decision Making/Informed Consent   The risks [esophageal damage, perforation (1:10,000 risk), bleeding, pharyngeal hematoma as well as other potential complications associated with conscious sedation including aspiration, arrhythmia, respiratory failure and death],  benefits (treatment guidance and diagnostic support) and alternatives of a transesophageal echocardiogram were discussed in detail with [name] and she is willing to proceed.          For questions or updates, please contact [facility] Please consult [email or web address] for contact info under      Signed, Darryle T. Barbaraann, MD, [facility]  [facility]  01/09/2024 10:07 AM

## 2024-01-09 NOTE — H&P (View-Only) (Signed)
 Cardiology Progress Note  Patient ID: Tracy Fitzgerald MRN: [id number] DOB: [birth year] Date of Encounter: 01/09/2024 Primary Cardiologist: Newman JINNY Lawrence, MD  Subjective   Chief Complaint: None.   HPI: Denies chest pain or trouble breathing.  Plan for TEE and coronary CTA today.  ROS:  All other ROS reviewed and negative. Pertinent positives noted in the HPI.     Telemetry  Overnight telemetry shows sinus rhythm 80s, which I personally reviewed.    Physical Exam   Vitals:   01/09/24 0434 01/09/24 0436 01/09/24 0736 01/09/24 0748  BP: 123/73  105/73   Pulse: 75  82 78  Resp: 20  20 16   Temp: 97.9 F (36.6 C)  98.8 F (37.1 C)   TempSrc: Oral  Oral   SpO2: 93%  96% 96%  Weight:  114.2 kg    Height:        Intake/Output Summary (Last 24 hours) at 01/09/2024 1007 Last data filed at 01/09/2024 0738 Gross per 24 hour  Intake 897 ml  Output 2150 ml  Net -1253 ml       01/09/2024    4:36 AM 01/08/2024    2:58 AM 01/07/2024    9:00 PM  Last 3 Weights  Weight (lbs) 251 lb 12.8 oz 252 lb 10.4 oz 252 lb 10.4 oz  Weight (kg) 114.216 kg 114.601 kg 114.601 kg    Body mass index is 40.64 kg/m.  General: Well nourished, well developed, in no acute distress Head: Atraumatic, normal size  Eyes: PEERLA, EOMI  Neck: Supple, no JVD Endocrine: No thryomegaly Cardiac: Normal S1, S2; RRR; 2 out of 6 systolic ejection murmur Lungs: Clear to auscultation bilaterally, no wheezing, rhonchi or rales  Abd: Soft, nontender, no hepatomegaly  Ext: No edema, pulses 2+ Musculoskeletal: No deformities, BUE and BLE strength normal and equal Skin: Warm and dry, no rashes   Neuro: Alert and oriented to person, place, time, and situation, CNII-XII grossly intact, no focal deficits  Psych: Normal mood and affect   Cardiac Studies  TTE 01/08/2024  1. At least moderate aortic stenosis. Vmax 3.25 m/s, MG 25 mmHG, AVA 0.79  cm2, DI 0.25. Planned for better characterize severity of AS. The  aortic  valve was not well visualized. Aortic valve regurgitation is mild.  Moderate to severe aortic valve  stenosis. Aortic valve area, by VTI measures 0.79 cm. Aortic valve mean  gradient measures 25.0 mmHg. Aortic valve Vmax measures 3.25 m/s.   2. Left ventricular ejection fraction, by estimation, is 55 to 60%. The  left ventricle has normal function. The left ventricle has no regional  wall motion abnormalities. Left ventricular diastolic parameters are  consistent with Grade I diastolic  dysfunction (impaired relaxation).   3. Right ventricular systolic function is normal. The right ventricular  size is normal. Tricuspid regurgitation signal is inadequate for assessing  PA pressure.   4. The mitral valve is grossly normal. Trivial mitral valve  regurgitation. No evidence of mitral stenosis.   5. The inferior vena cava is dilated in size with >50% respiratory  variability, suggesting right atrial pressure of 8 mmHg.   Patient Profile  Tracy Fitzgerald is a 58 y.o. female with HFpEF, diabetes, hypertension, OSA, tobacco abuse, homelessness admitted on 01/07/2024 with chest pains and concern for severe aortic stenosis.  Assessment & Plan   # Chest pain, possibly cardiac # Moderate to severe aortic stenosis - Admitted with chest pain symptoms that have been worsening.  Echo shows possible moderate to severe  aortic stenosis.  Murmur inconsistent with severe aortic stenosis. - Troponins negative.  EKG nonischemic. - Plans for transesophageal echo and coronary CTA to evaluate her valve and coronary arteries today. - We should have an answer today. - Continue aspirin  and statin in the interim.  Informed Consent   Shared Decision Making/Informed Consent   The risks [esophageal damage, perforation (1:10,000 risk), bleeding, pharyngeal hematoma as well as other potential complications associated with conscious sedation including aspiration, arrhythmia, respiratory failure and death],  benefits (treatment guidance and diagnostic support) and alternatives of a transesophageal echocardiogram were discussed in detail with [name] and she is willing to proceed.          For questions or updates, please contact [facility] Please consult [email or web address] for contact info under      Signed, Darryle T. Barbaraann, MD, [facility] [place]  [facility]  01/09/2024 10:07 AM

## 2024-01-09 NOTE — CV Procedure (Signed)
    TRANSESOPHAGEAL ECHOCARDIOGRAM   NAME:  Tracy Fitzgerald    MRN: [id number] DOB:  June 20, 1965    ADMIT DATE: 01/07/2024  INDICATIONS: Moderate to severe AS  PROCEDURE:  Informed consent was obtained prior to the procedure. The risks, benefits and alternatives for the procedure were discussed and the patient comprehended these risks.  Risks include, but are not limited to, cough, sore throat, vomiting, nausea, somnolence, esophageal and stomach trauma or perforation, bleeding, low blood pressure, aspiration, pneumonia, infection, trauma to the teeth and death.    Procedural time out performed. The oropharynx was anesthetized with topical 1% benzocaine.    Anesthesia was administered by Dr. Waddell.  The patient's heart rate, blood pressure, and oxygen  saturation are monitored continuously during the procedure. The period of conscious sedation is 26 minutes, of which I was present face-to-face 100% of this time.   The transesophageal probe was inserted in the esophagus and stomach without difficulty and multiple views were obtained.   COMPLICATIONS:    There were no immediate complications.  KEY FINDINGS:  Moderate AS. Normal LVEF 55-60%  Full report to follow. Further management per primary team.   Signed, Joelle DEL. Ren Ny, MD, [facility]  [facility]  2:10 PM

## 2024-01-09 NOTE — Transfer of Care (Signed)
 Immediate Anesthesia Transfer of Care Note  Patient: Tracy Fitzgerald  Procedure(s) Performed: TRANSESOPHAGEAL ECHOCARDIOGRAM  Patient Location: PACU and Cath Lab  Anesthesia Type:MAC  Level of Consciousness: awake, alert , oriented, and patient cooperative  Airway & Oxygen  Therapy: Patient Spontanous Breathing  Post-op Assessment: Report given to RN and Post -op Vital signs reviewed and stable  Post vital signs: Reviewed and stable  Last Vitals:  Vitals Value Taken Time  BP 106/72 01/09/24 14:16  Temp    Pulse 71 01/09/24 14:18  Resp 17 01/09/24 14:18  SpO2 97 % 01/09/24 14:18  Vitals shown include unfiled device data.  Last Pain:  Vitals:   01/09/24 1415  TempSrc:   PainSc: 0-No pain         Complications: There were no known notable events for this encounter.

## 2024-01-09 NOTE — Progress Notes (Signed)
 CSW provided patient with community resources to address SDOH needs for housing, transport, utilites, and food.   Zerick Prevette, MSW, LCSWA Transitions of Care [phone]

## 2024-01-10 ENCOUNTER — Encounter (HOSPITAL_COMMUNITY): Payer: Self-pay

## 2024-01-10 DIAGNOSIS — R079 Chest pain, unspecified: Secondary | ICD-10-CM | POA: Diagnosis not present

## 2024-01-10 DIAGNOSIS — I5032 Chronic diastolic (congestive) heart failure: Secondary | ICD-10-CM | POA: Diagnosis not present

## 2024-01-10 DIAGNOSIS — I350 Nonrheumatic aortic (valve) stenosis: Secondary | ICD-10-CM | POA: Diagnosis not present

## 2024-01-10 LAB — CBC WITH DIFFERENTIAL/PLATELET
Abs Immature Granulocytes: 0.04 K/uL (ref 0.00–0.07)
Basophils Absolute: 0.1 K/uL (ref 0.0–0.1)
Basophils Relative: 1 %
Eosinophils Absolute: 0.2 K/uL (ref 0.0–0.5)
Eosinophils Relative: 3 %
HCT: 46.1 % — ABNORMAL HIGH (ref 36.0–46.0)
Hemoglobin: 15.4 g/dL — ABNORMAL HIGH (ref 12.0–15.0)
Immature Granulocytes: 1 %
Lymphocytes Relative: 25 %
Lymphs Abs: 2.2 K/uL (ref 0.7–4.0)
MCH: 30.7 pg (ref 26.0–34.0)
MCHC: 33.4 g/dL (ref 30.0–36.0)
MCV: 92 fL (ref 80.0–100.0)
Monocytes Absolute: 0.7 K/uL (ref 0.1–1.0)
Monocytes Relative: 8 %
Neutro Abs: 5.6 K/uL (ref 1.7–7.7)
Neutrophils Relative %: 62 %
Platelets: 251 K/uL (ref 150–400)
RBC: 5.01 MIL/uL (ref 3.87–5.11)
RDW: 13.3 % (ref 11.5–15.5)
WBC: 8.8 K/uL (ref 4.0–10.5)
nRBC: 0 % (ref 0.0–0.2)

## 2024-01-10 LAB — BASIC METABOLIC PANEL WITH GFR
Anion gap: 9 (ref 5–15)
BUN: 15 mg/dL (ref 6–20)
CO2: 23 mmol/L (ref 22–32)
Calcium: 9.6 mg/dL (ref 8.9–10.3)
Chloride: 102 mmol/L (ref 98–111)
Creatinine, Ser: 0.99 mg/dL (ref 0.44–1.00)
GFR, Estimated: >60 mL/min (ref >60)
Glucose, Bld: 138 mg/dL — ABNORMAL HIGH (ref 70–99)
Potassium: 3.8 mmol/L (ref 3.5–5.1)
Sodium: 134 mmol/L — ABNORMAL LOW (ref 135–145)

## 2024-01-10 LAB — GLUCOSE, CAPILLARY
Glucose-Capillary: 168 mg/dL — ABNORMAL HIGH (ref 70–99)
Glucose-Capillary: 149 mg/dL — ABNORMAL HIGH (ref 70–99)

## 2024-01-10 LAB — LIPOPROTEIN A (LPA): Lipoprotein (a): 57.8 nmol/L — ABNORMAL HIGH (ref <75.0)

## 2024-01-10 MED ORDER — FUROSEMIDE 40 MG PO TABS: 40.0000 mg | ORAL_TABLET | Freq: Every day | ORAL | 0 refills | Status: DC

## 2024-01-10 MED ORDER — NICOTINE 21 MG/24HR TD PT24: 21.0000 mg | MEDICATED_PATCH | Freq: Every day | TRANSDERMAL | 0 refills | Status: AC

## 2024-01-10 MED ORDER — ATORVASTATIN CALCIUM 80 MG PO TABS: 80.0000 mg | ORAL_TABLET | Freq: Every day | ORAL | 0 refills | Status: DC

## 2024-01-10 NOTE — TOC CM/SW Note (Signed)
 Transition of Care [facility]) - Inpatient Brief Assessment   Patient Details  Name: Tracy Fitzgerald MRN: [id number] Date of Birth: Jul 14, 1965  Transition of [facility]) CM/SW Contact:    Waddell Barnie Rama, RN Phone Number: 01/10/2024, 10:11 AM   Clinical Narrative: From home with spouse, lives in a tent (pretty big tent per patient)  has PCP  [facility] clinic and insurance on file, states has no HH services in place at this time , has walker at home.  States she  will need a cab for  transport  home at [employer] and spouse is support system, states gets medications from [facility] on [address]  Pta self ambulatory with walker.   CSW gave patient resources.      Transition of Care Asessment: Insurance and Status: Insurance coverage has been reviewed Patient has primary care physician: Yes Home environment has been reviewed: lives in a tent with spouse, (pretty big tent) Prior level of function:: ambulatory with walker Prior/Current Home Services: Current home services (rolling walker) Social Drivers of Health Review: SDOH reviewed interventions complete Readmission risk has been reviewed: Yes Transition of care needs: no transition of care needs at this time

## 2024-01-10 NOTE — TOC Transition Note (Signed)
 Transition of Care [facility]) - Discharge Note   Patient Details  Name: Tracy Fitzgerald MRN: [id number] Date of Birth: 06-18-1965  Transition of Care [facility]) CM/SW Contact:  Waddell Barnie Rama, RN Phone Number: 01/10/2024, 12:46 PM   Clinical Narrative:    For dc today, will need a cab.         Patient Goals and CMS Choice            Discharge Placement                       Discharge Plan and Services Additional resources added to the After Visit Summary for                                       Social Drivers of Health (SDOH) Interventions SDOH Screenings   Food Insecurity: Food Insecurity Present (01/07/2024)  Housing: High Risk (01/07/2024)  Transportation Needs: Unmet Transportation Needs (01/07/2024)  Utilities: At Risk (01/07/2024)  Alcohol Screen: Low Risk  (08/02/2023)  Depression (PHQ2-9): High Risk (12/20/2023)  Financial Resource Strain: High Risk (01/05/2024)  Physical Activity: Insufficiently Active (08/02/2023)  Social Connections: Socially Isolated (08/02/2023)  Stress: Stress Concern Present (08/02/2023)  Tobacco Use: High Risk (01/07/2024)  Health Literacy: Adequate Health Literacy (01/05/2024)     Readmission Risk Interventions     No data to display

## 2024-01-10 NOTE — Progress Notes (Signed)
 Cardiology Progress Note  Patient ID: Elfreda Blanchet MRN: [id number] DOB: [birth year] Date of Encounter: 01/10/2024 Primary Cardiologist: Newman JINNY Lawrence, MD  Subjective   Chief Complaint: None.   HPI: Reviewed her testing.  Findings consistent with moderate aortic stenosis.  Nonobstructive CAD.  ROS:  All other ROS reviewed and negative. Pertinent positives noted in the HPI.     Telemetry  Overnight telemetry shows sinus rhythm 80s, which I personally reviewed.     Physical Exam   Vitals:   01/09/24 1555 01/10/24 0020 01/10/24 0446 01/10/24 0734  BP: 132/85 115/80 122/75   Pulse: 76 77 80 84  Resp: 20 20 18 16   Temp: 98.2 F (36.8 C) 98.5 F (36.9 C) 98.2 F (36.8 C)   TempSrc: Oral Oral Oral   SpO2: 96% 92% 97%   Weight:   114.8 kg   Height:        Intake/Output Summary (Last 24 hours) at 01/10/2024 0906 Last data filed at 01/10/2024 0449 Gross per 24 hour  Intake 950 ml  Output 3250 ml  Net -2300 ml       01/10/2024    4:46 AM 01/09/2024    4:36 AM 01/08/2024    2:58 AM  Last 3 Weights  Weight (lbs) 253 lb 1.6 oz 251 lb 12.8 oz 252 lb 10.4 oz  Weight (kg) 114.805 kg 114.216 kg 114.601 kg    Body mass index is 40.85 kg/m.  General: Well nourished, well developed, in no acute distress Head: Atraumatic, normal size  Eyes: PEERLA, EOMI  Neck: Supple, no JVD Endocrine: No thryomegaly Cardiac: Normal S1, S2; RRR; 2 out of 6 systolic ejection murmur Lungs: Clear to auscultation bilaterally, no wheezing, rhonchi or rales  Abd: Soft, nontender, no hepatomegaly  Ext: No edema, pulses 2+ Musculoskeletal: No deformities, BUE and BLE strength normal and equal Skin: Warm and dry, no rashes   Neuro: Alert and oriented to person, place, time, and situation, CNII-XII grossly intact, no focal deficits  Psych: Normal mood and affect   Cardiac Studies  TEE 01/09/2024  1. Left ventricular ejection fraction, by estimation, is 50 to 55%. The  left ventricle has  low normal function.   2. No left atrial/left atrial appendage thrombus was detected.   3. The mitral valve is normal in structure. Trivial mitral valve  regurgitation. No evidence of mitral stenosis.   4. There is a bicuspid aortic valve with fusion of left and right cusps  and associated moderate AS (PV 2.33 m/sec, MG 13 mmHg, AVA 1.32 cm^2 by  VTI, DVI 0.31) and mild aortic regurgitation. . The aortic valve is  bicuspid. Aortic valve regurgitation is  mild. Moderate aortic valve stenosis.   5. Aortic dilatation noted. There is dilatation of the ascending aorta,  measuring 41 mm.   CCTA 01/09/2024 IMPRESSION: 1. Coronary calcium  score of 378. This was 98th percentile for age-, sex, and race-matched controls.   2. Normal coronary origin with right dominance.   3. Mild mixed density plaque (25-49%) in the LAD.   4. Mild mixed density plaque (25-49%) in the LCX.   5. Minimal CAD (<25%) in the RCA.   6. Bicuspid aortic valve with fusion of the RCC/LCC with raphe. Suspect moderate aortic stenosis based on AVA of 1.61 cm2 and calcium  score of 933.   7. Dilated pulmonary artery suggestive of pulmonary hypertension.  Patient Profile  Yanilen Adamik is a 58 y.o. female with aortic stenosis, HFpEF, diabetes, hypertension, OSA, tobacco abuse, homelessness admitted  on 01/07/2024 with acute chest pain.  Assessment & Plan   # Acute chest pain - Noncardiac.  Coronary CTA with nonobstructive disease.  Moderate aortic stenosis.  No cardiac explanation.  Suspect this could have been related to asthma or COPD.  She describes symptoms worsening with cold weather change.  Further management per hospital team.  # Moderate aortic stenosis # Bicuspid aortic valve - TEE and cardiac CT consistent with moderate aortic stenosis.  Will need yearly follow-up in our office.  Does not need valve intervention at this time.  # Nonobstructive CAD - Aspirin  and statin.  # Chronic HFpEF - Euvolemic.  She  can take her maintenance dose of Lasix .  [facility] will sign off.   The patient is ready for discharge today from a cardiac standpoint. Medication Recommendations:  As above Other recommendations (labs, testing, etc):  none Follow up as an outpatient:  4-6 weeks with [facility].   For questions or updates, please contact [facility] Please consult [email or web address] for contact info under         Signed, Darryle T. Barbaraann, MD, [facility]  [facility]  01/10/2024 9:06 AM

## 2024-01-11 ENCOUNTER — Ambulatory Visit (HOSPITAL_COMMUNITY): Admission: RE | Admit: 2024-01-11 | Admitting: Cardiology

## 2024-01-11 ENCOUNTER — Encounter (HOSPITAL_COMMUNITY): Admission: RE | Payer: Self-pay | Source: Home / Self Care

## 2024-01-11 ENCOUNTER — Ambulatory Visit: Admitting: Physician Assistant

## 2024-01-11 DIAGNOSIS — I350 Nonrheumatic aortic (valve) stenosis: Secondary | ICD-10-CM

## 2024-01-11 LAB — HIGH SENSITIVITY CRP: CRP, High Sensitivity: 3.97 mg/L — ABNORMAL HIGH (ref 0.00–3.00)

## 2024-01-11 SURGERY — TRANSESOPHAGEAL ECHOCARDIOGRAM (TEE) (CATHLAB)
Anesthesia: Monitor Anesthesia Care

## 2024-01-11 NOTE — Discharge Summary (Signed)
 Physician Discharge Summary   Patient: Tracy Fitzgerald MRN: [id number] DOB: 10-25-1965  Admit date:     01/07/2024  Discharge date: 01/10/2024  Discharge Physician: Brigida Bureau   PCP: Vicci Barnie NOVAK, MD   Recommendations at discharge:    Discharge to home Stop smoking Follow up with PCP in 7-10 days. Have chemistry checked at that visit to be reported to PCP. Follow up withy Cardiology in one year.  Discharge Diagnoses: Principal Problem:   Acute chest pain Active Problems:   Hypertension associated with diabetes (HCC)   OSA (obstructive sleep apnea)   Type 2 diabetes, controlled, with peripheral neuropathy (HCC)   Hyperlipidemia associated with type 2 diabetes mellitus (HCC)   Chronic heart failure with preserved ejection fraction (HFpEF, >= 50%) (HCC)   Hypomagnesemia   Aortic stenosis  Resolved Problems:   * No resolved hospital problems. [place] Course: The patient is a 58 yr old woman who presented to [facility] ED on 01/07/2024 with complaints of chest pain. She stated that it started in the middle of last night when she was going to the bathroom. It was severe, sharp, located in the center of her chest. She describes it as crushing with radiation down both arms. This episode was self limited, but it recurred several times with exertion and then again while at rest. The episodes were associated with shortness of breath.    She has a past medical history significant for chronic HFpEF, AS, DM Type II, asthma, depression/anxiety, OSA, tobacco use ( 0.75 PPD currently, used to smoke 3 ppd). She has been following with cardiology as outpatient due to exertional dyspnea. Thsu far that investigation has yielded: 11/03/2023 Lexiscan  was normal, low risk.  Coronary artery calcifications were noted.   12/13/2023 TTE showed moderate-severe aortic stenosis.  TEE was recommended and has been scheduled for 01/11/2024.   In the ED she was found to have troponin 8>1, CXR demonstrating  interstitial opacities, Magnesium of 1.6.   Magnesium was supplemented.   The patient was admitted to a telemetry bed. Cardiology has been consutled. TTE was performed on 01/08/2024 and demonstrated at least moderate aortic stenosis/ LVEF was calculated to be 55-60% with normal LV function and no regional wall motion abnormalities. There was Grade I diastolic dysfuntion. RV size and systolic function were within normal range. TEE was recommended to better characterize the aortic valve stenosis. This was performed on 01/09/2024. It demonstrated EF of 50-55% with low normal function. There was no left atrial/left atrial appendage thrombus detected. The aortic valve was found to be bicuspid with fusion of the left and right cusps and associated moderate AS and mild aortic regurgitation. There was also for to be dilatation of the ascending aorta (41mm).    Coronary CTA was also performed. It demonstrated aortic atherosclerosis.  On 01/11/2024 the patient was cleared for discharge to home.  Assessment and Plan: Chest pain: Patient presented with concerning chest pain, usually exertional but also reports an episode while at rest.  Troponin has been negative x 2.  EKG without any new significant ischemic changes.  Coronary artery calcifications were noted on CT imaging in August.  TTE 12/13/2023 showed mod-severe AS, EF 65-70%, no RWMA. - Cardiology consulted - Keep on telemetry - Continue aspirin , atorvastatin  -TTE and TEE completed. Pending cardiology evaluation and planning. There seems to have been a reduction in EF since the September study   Moderate-severe aortic stenosis: Noted on outpatient TTE.  TEE was performed today. Results as in HPI.  Cardiology eval and plans are pending.    Chronic HFpEF: Appears euvolemic.  Continue lisinopril  5 mg daily and PO Lasix  40 mg daily. By measurements in TTE and TEE it appears that the patient's ejection fraction had decreased quite a bit since late  September. Monitor volume status.   Hypomagnesemia: Supplementing.  Resolved.   Type 2 diabetes: Hemoglobin A1c 9.0%.  Holding metformin , placed on SSI.   Hypertension: Continue lisinopril .   Depression/anxiety: Continue home Abilify , bupropion , sertraline .   Polycythemia: Mild and appears chronic.  Likely multifactorial from tobacco use and OSA.   OSA: Continue CPAP nightly.  Patient states her home CPAP machine broke 7 months ago and she is scheduled for another sleep study to obtain a new CPAP.   Tobacco use: Patient reports smoking 0.75 PPD.  Smoking cessation advised.  Nicotine  patch provided.   DVT prophylaxis: enoxaparin  (LOVENOX ) injection 40 mg Start: 01/07/24 1945 Code Status: Full code, confirmed with patient on admission Family Communication: Discussed with patient, she has discussed with family Disposition Plan: Pending clinical progress Consults called: Cardiology  Diet recommendation:  Discharge Diet Orders (From admission, onward)     Start     Ordered   01/10/24 0000  Diet - low sodium heart healthy        01/10/24 1236   01/10/24 0000  Diet Carb Modified        01/10/24 1236           Cardiac diet DISCHARGE MEDICATION: Allergies as of 01/10/2024       Reactions   Cymbalta  [duloxetine  Hcl]    Urinary retention        Medication List     TAKE these medications    Accu-Chek Guide test strip Generic drug: glucose blood Test blood sugar once a day   Accu-Chek Guide w/Device Kit Test blood sugar once a day   Accu-Chek Softclix Lancets lancets Test blood sugar once a day   ARIPiprazole  10 MG tablet Commonly known as: ABILIFY  Take 1 tablet (10 mg total) by mouth daily.   aspirin  EC 81 MG tablet Take 1 tablet (81 mg total) by mouth daily. Swallow whole.   atorvastatin  80 MG tablet Commonly known as: LIPITOR Take 1 tablet (80 mg total) by mouth daily. What changed:  medication strength how much to take   buPROPion  150 MG 12 hr  tablet Commonly known as: ZYBAN  TAKE 1 TABLET BY MOUTH TWICE A DAY   EYE DROPS OP Place 1 drop into both eyes daily as needed (dry eyes). Multi- purpose eye drops saline   furosemide  40 MG tablet Commonly known as: LASIX  Take 1 tablet (40 mg total) by mouth daily.   gabapentin  300 MG capsule Commonly known as: Neurontin  Take 2 capsules (600 mg total) by mouth 3 (three) times daily AND 3 capsules (900 mg total) at bedtime.   lidocaine  5 % Commonly known as: LIDODERM  PLACE 1 PATCH ONTO SKIN DAILY TO LOWER BACK, REMOVE AFTER 12 HOURS What changed: See the new instructions.   lisinopril  5 MG tablet Commonly known as: ZESTRIL  Take 1 tablet (5 mg total) by mouth daily.   meloxicam  15 MG tablet Commonly known as: MOBIC  Take 1 tablet (15 mg total) by mouth daily as needed for pain What changed: additional instructions   metFORMIN  500 MG 24 hr tablet Commonly known as: GLUCOPHAGE -XR Take 1 tablet (500 mg total) by mouth 2 (two) times daily with a meal.   mometasone-formoterol 100-5 MCG/ACT Aero Commonly known as: DULERA  Inhale 2 puffs into the lungs 2 (two) times daily.   nicotine  21 mg/24hr patch Commonly known as: NICODERM CQ  - dosed in mg/24 hours Place 1 patch (21 mg total) onto the skin daily.   Ozempic  (0.25 or 0.5 MG/DOSE) 2 MG/3ML Sopn Generic drug: Semaglutide (0.25 or 0.5MG /DOS) Inject 0.25 mg into the skin once a week. Pharmacist to teach administration   potassium chloride  SA 20 MEQ tablet Commonly known as: KLOR-CON  M Take 1 tablet (20 mEq total) by mouth daily.   sertraline  100 MG tablet Commonly known as: ZOLOFT  Take 2 tablets (200 mg total) by mouth daily.   Ventolin  HFA 108 (90 Base) MCG/ACT inhaler Generic drug: albuterol  INHALE 2 PUFFS INTO THE LUNGS EVERY 4 HOURS AS NEEDED FOR WHEEZING OR SHORTNESS OF BREATH        Discharge Exam: Filed Weights   01/08/24 0258 01/09/24 0436 01/10/24 0446  Weight: 114.6 kg 114.2 kg 114.8 kg    Exam:  Constitutional:  The patient is awake, alert, and oriented x 3. No acute distress. Eyes:  pupils and irises appear normal Normal lids and conjunctivae ENMT:  grossly normal hearing  Lips appear normal external ears, nose appear normal Oropharynx: mucosa, tongue,posterior pharynx appear normal Neck:  neck appears normal, no masses, normal ROM, supple no thyromegaly Respiratory:  No increased work of breathing. No wheezes, rales, or rhonchi No tactile fremitus Cardiovascular:  Regular rate and rhythm No murmurs, ectopy, or gallups. No lateral PMI. No thrills. Abdomen:  Abdomen is soft, non-tender, non-distended No hernias, masses, or organomegaly Normoactive bowel sounds.  Musculoskeletal:  No cyanosis, clubbing, or edema Skin:  No rashes, lesions, ulcers palpation of skin: no induration or nodules Neurologic:  CN 2-12 intact Sensation all 4 extremities intact Psychiatric:  Mental status Mood, affect appropriate Orientation to person, place, time  judgment and insight appear intact   Condition at discharge: fair  The results of significant diagnostics from this hospitalization (including imaging, microbiology, ancillary and laboratory) are listed below for reference.   Imaging Studies: CT CORONARY MORPH W/CTA COR W/SCORE W/CA W/CM &/OR WO/CM Addendum Date: 01/09/2024 ADDENDUM REPORT: 01/09/2024 22:35 EXAM: OVER-READ INTERPRETATION  CT CHEST The following report is an over-read performed by radiologist Dr. Andrea Gasman of [facility] on 01/09/2024. This over-read does not include interpretation of cardiac or coronary anatomy or pathology. The coronary CTA interpretation by the cardiologist is attached. COMPARISON:  None. FINDINGS: Vascular: Mild aortic atherosclerosis. The included aorta is normal in caliber. Mediastinum/nodes: No adenopathy or mass. Unremarkable esophagus. Lungs: No focal airspace disease. No pulmonary nodule. No pleural  fluid. 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 Upper abdomen: No acute or unexpected findings. Musculoskeletal: There are no acute or suspicious osseous abnormalities. IMPRESSION: Aortic Atherosclerosis (ICD10-I70.0). Electronically Signed   By: Andrea Gasman M.D.   On: 01/09/2024 22:35   Result Date: 01/09/2024 CLINICAL DATA:  Chest pain.  Concerns for severe aortic stenosis. EXAM: Cardiac/Coronary CTA TECHNIQUE: A non-contrast, gated CT scan was obtained with axial slices of 3 mm through the heart for calcium  scoring. Calcium  scoring was performed using the Agatston method. A 120 kV retrospective, gated, contrast cardiac scan was obtained. Gantry rotation speed was 250 msecs and collimation was 0.6 mm. Two sublingual nitroglycerin tablets (0.8 mg) were given. The 3D data set was reconstructed in 5% intervals of the 0-95% of the R-R cycle. Diastolic phases were analyzed on a dedicated workstation using MPR, MIP, and VRT modes. The patient received 95 cc of contrast.  FINDINGS: Image quality: Excellent. Noise artifact is: Limited. Aortic valve: Bicuspid aortic valve with fusion of the RCC/LCC with raphe. Moderate calcifications with restricted leaflet motion. Nyah Shepherd by planimetry 1.61 cm2. Aortic valve calcium  score 933. Coronary Arteries:  Normal coronary origin.  Right dominance. Left main: The left main is a large caliber vessel with a normal take off from the left coronary cusp that bifurcates to form a left anterior descending artery and a left circumflex artery. There is no plaque or stenosis. Left anterior descending artery: The proximal LAD contains minimal mixed density plaque (<25%). The mid LAD contains mild mixed density plaque (25-49%). The distal LAD is small and diminutive. D1 contains mild non-calcified plaque (25-49%). D2 is patent. Left circumflex artery: The LCX is non-dominant. There is mild mixed density plaque in the mid LCX. The LCX gives off 2 patent obtuse marginal branches.  Right coronary artery: The RCA is dominant with normal take off from the right coronary cusp. There is minimal calcified plaque in the proximal segment (<25%). The RCA terminates as a PDA and right posterolateral branch without evidence of plaque or stenosis. Right Atrium: Right atrial size is within normal limits. Right Ventricle: The right ventricular cavity is within normal limits. Left Atrium: Left atrial size is normal in size with no left atrial appendage filling defect. Left Ventricle: The ventricular cavity size is within normal limits. Pulmonary arteries: Dilated pulmonary artery suggestive of pulmonary hypertension. Pulmonary veins: Normal pulmonary venous drainage. Pericardium: Normal thickness without significant effusion or calcium  present. Mitral valve: The mitral valve is normal without significant calcification. Aorta: Normal caliber up to 37 mm without significant disease. Aortic root up to 36 mm. Extra-cardiac findings: See attached radiology report for non-cardiac structures. IMPRESSION: 1. Coronary calcium  score of 378. This was 98th percentile for age-, sex, and race-matched controls. 2. Normal coronary origin with right dominance. 3. Mild mixed density plaque (25-49%) in the LAD. 4. Mild mixed density plaque (25-49%) in the LCX. 5. Minimal CAD (<25%) in the RCA. 6. Bicuspid aortic valve with fusion of the RCC/LCC with raphe. Suspect moderate aortic stenosis based on Rashia Mckesson of 1.61 cm2 and calcium  score of 933. 7. Dilated pulmonary artery suggestive of pulmonary hypertension. RECOMMENDATIONS: 1. CAD-RADS 2: Mild non-obstructive CAD (25-49%). Consider non-atherosclerotic causes of chest pain. Consider preventive therapy and risk factor modification. Darryle Decent, MD Electronically Signed: By: Darryle Decent M.D. On: 01/09/2024 13:35   ECHO TEE Result Date: 01/09/2024    TRANSESOPHOGEAL ECHO REPORT   Patient Name:   Jylian Verge Date of Exam: 01/09/2024 Medical Rec #:  [id number]     Height:        66.0 in Accession #:    [id number]    Weight:       251.8 lb Date of Birth:  [birth year]     BSA:          2.205 m Patient Age:    58 years      BP:           105/73 mmHg Patient Gender: F             HR:           62 bpm. Exam Location:  Inpatient Procedure: Transesophageal Echo, Cardiac Doppler and Color Doppler (Both            Spectral and Color Flow Doppler were utilized during procedure). Indications:     I35.0 Nonrheumatic aortic (valve) stenosis  History:  Patient has prior history of Echocardiogram examinations, most                  recent 01/08/2024. CHF, Aortic Valve Disease,                  Signs/Symptoms:Chest Pain; Risk Factors:Sleep Apnea, Diabetes,                  Hypertension and Current Smoker. Bicuspid aortic valve.  Sonographer:     Ellouise Mose RDCS Referring Phys:  [id number] DAYNA N DUNN Diagnosing Phys: Joelle Cedars Tonleu PROCEDURE: After discussion of the risks and benefits of a TEE, an informed consent was obtained from the patient. The transesophogeal probe was passed without difficulty through the esophogus of the patient. Imaged were obtained with the patient in a left lateral decubitus position. Sedation performed by different physician. The patient was monitored while under deep sedation. Anesthestetic sedation was provided intravenously by Anesthesiology: 274mg  of Propofol , 100mg  of Lidocaine . The patient's vital signs; including heart rate, blood pressure, and oxygen  saturation; remained stable throughout the procedure. The patient developed no complications during the procedure.  IMPRESSIONS  1. Left ventricular ejection fraction, by estimation, is 50 to 55%. The left ventricle has low normal function.  2. No left atrial/left atrial appendage thrombus was detected.  3. The mitral valve is normal in structure. Trivial mitral valve regurgitation. No evidence of mitral stenosis.  4. There is a bicuspid aortic valve with fusion of left and right cusps and associated moderate AS (PV  2.33 m/sec, MG 13 mmHg, Rockey Guarino 1.32 cm^2 by VTI, DVI 0.31) and mild aortic regurgitation. . The aortic valve is bicuspid. Aortic valve regurgitation is mild. Moderate aortic valve stenosis.  5. Aortic dilatation noted. There is dilatation of the ascending aorta, measuring 41 mm. FINDINGS  Left Ventricle: Left ventricular ejection fraction, by estimation, is 50 to 55%. The left ventricle has low normal function. Left Atrium: No left atrial/left atrial appendage thrombus was detected. Pericardium: There is no evidence of pericardial effusion. Mitral Valve: The mitral valve is normal in structure. Trivial mitral valve regurgitation. No evidence of mitral valve stenosis. Tricuspid Valve: The tricuspid valve is normal in structure. Tricuspid valve regurgitation is trivial. No evidence of tricuspid stenosis. Aortic Valve: There is a bicuspid aortic valve with fusion of left and right cusps and associated moderate AS (PV 2.33 m/sec, MG 13 mmHg, Shaneca Orne 1.32 cm^2 by VTI, DVI 0.31) and mild aortic regurgitation. The aortic valve is bicuspid. Aortic valve regurgitation is mild. Moderate aortic stenosis is present. Aortic valve mean gradient measures 13.0 mmHg. Aortic valve peak gradient measures 21.7 mmHg. Aortic valve area, by VTI measures 1.32 cm. Pulmonic Valve: The pulmonic valve was normal in structure. Pulmonic valve regurgitation is trivial. No evidence of pulmonic stenosis. Aorta: Aortic dilatation noted. There is dilatation of the ascending aorta, measuring 41 mm. Venous: A normal flow pattern is recorded from the right upper pulmonary vein and the right lower pulmonary vein. IAS/Shunts: The interatrial septum appears to be lipomatous. No atrial level shunt detected by color flow Doppler. Additional Comments: Spectral Doppler performed. LEFT VENTRICLE PLAX 2D LVOT diam:     2.30 cm LV SV:         67 LV SV Index:   31 LVOT Area:     4.15 cm  AORTIC VALVE AV Area (Vmax):    1.34 cm AV Area (Vmean):   1.35 cm AV Area  (VTI):     1.32 cm AV  Vmax:           233.00 cm/s AV Vmean:          177.000 cm/s AV VTI:            0.510 m AV Peak Grad:      21.7 mmHg AV Mean Grad:      13.0 mmHg LVOT Vmax:         75.00 cm/s LVOT Vmean:        57.400 cm/s LVOT VTI:          0.162 m LVOT/AV VTI ratio: 0.32  AORTA Ao Asc diam: 4.05 cm  SHUNTS Systemic VTI:  0.16 m Systemic Diam: 2.30 cm Joelle Cedars Tonleu Electronically signed by Joelle Cedars Tonleu Signature Date/Time: 01/09/2024/4:04:11 PM    Final    EP STUDY Result Date: 01/09/2024 See surgical note for result.  ECHOCARDIOGRAM COMPLETE Result Date: 01/08/2024    ECHOCARDIOGRAM REPORT   Patient Name:   Janecia Palau Date of Exam: 01/08/2024 Medical Rec #:  [id number]     Height:       66.0 in Accession #:    [id number]    Weight:       252.6 lb Date of Birth:  March 15, 1966     BSA:          2.209 m Patient Age:    58 years      BP:           134/91 mmHg Patient Gender: F             HR:           104 bpm. Exam Location:  Inpatient Procedure: 2D Echo, Cardiac Doppler and Color Doppler (Both Spectral and Color            Flow Doppler were utilized during procedure). Indications:    Chest Pain R07.9  History:        Patient has prior history of Echocardiogram examinations, most                 recent 12/13/2023. CHF, Signs/Symptoms:Chest Pain; Risk                 Factors:Hypertension, Sleep Apnea, Dyslipidemia, Diabetes and                 Current Smoker.  Sonographer:    Thea Norlander RCS Referring Phys: JORIE SAUNDERS PATEL IMPRESSIONS  1. At least moderate aortic stenosis. Vmax 3.25 m/s, MG 25 mmHG, [name] 0.79 cm2, DI 0.25. Planned for better characterize severity of AS. The aortic valve was not well visualized. Aortic valve regurgitation is mild. Moderate to severe aortic valve stenosis. Aortic valve area, by VTI measures 0.79 cm. Aortic valve mean gradient measures 25.0 mmHg. Aortic valve Vmax measures 3.25 m/s.  2. Left ventricular ejection fraction, by estimation, is 55 to 60%. The  left ventricle has normal function. The left ventricle has no regional wall motion abnormalities. Left ventricular diastolic parameters are consistent with Grade I diastolic dysfunction (impaired relaxation).  3. Right ventricular systolic function is normal. The right ventricular size is normal. Tricuspid regurgitation signal is inadequate for assessing PA pressure.  4. The mitral valve is grossly normal. Trivial mitral valve regurgitation. No evidence of mitral stenosis.  5. The inferior vena cava is dilated in size with >50% respiratory variability, suggesting right atrial pressure of 8 mmHg. Comparison(s): No significant change from prior study. FINDINGS  Left Ventricle: Left ventricular ejection fraction, by estimation, is 55  to 60%. The left ventricle has normal function. The left ventricle has no regional wall motion abnormalities. The left ventricular internal cavity size was normal in size. There is  no left ventricular hypertrophy. Left ventricular diastolic parameters are consistent with Grade I diastolic dysfunction (impaired relaxation). Right Ventricle: The right ventricular size is normal. No increase in right ventricular wall thickness. Right ventricular systolic function is normal. Tricuspid regurgitation signal is inadequate for assessing PA pressure. Left Atrium: Left atrial size was normal in size. Right Atrium: Right atrial size was normal in size. Pericardium: There is no evidence of pericardial effusion. Mitral Valve: The mitral valve is grossly normal. Trivial mitral valve regurgitation. No evidence of mitral valve stenosis. Tricuspid Valve: The tricuspid valve is grossly normal. Tricuspid valve regurgitation is trivial. No evidence of tricuspid stenosis. Aortic Valve: At least moderate aortic stenosis. Vmax 3.25 m/s, MG 25 mmHG, Kita Neace 0.79 cm2, DI 0.25. Planned for better characterize severity of AS. The aortic valve was not well visualized. Aortic valve regurgitation is mild. Moderate to  severe aortic stenosis is present. Aortic valve mean gradient measures 25.0 mmHg. Aortic valve peak gradient measures 42.2 mmHg. Aortic valve area, by VTI measures 0.79 cm. Pulmonic Valve: The pulmonic valve was grossly normal. Pulmonic valve regurgitation is not visualized. No evidence of pulmonic stenosis. Aorta: The aortic root and ascending aorta are structurally normal, with no evidence of dilitation. Venous: The inferior vena cava is dilated in size with greater than 50% respiratory variability, suggesting right atrial pressure of 8 mmHg. IAS/Shunts: The atrial septum is grossly normal.  LEFT VENTRICLE PLAX 2D LVIDd:         4.50 cm   Diastology LVIDs:         3.00 cm   LV e' medial:    5.22 cm/s LV PW:         0.90 cm   LV E/e' medial:  10.7 LV IVS:        0.80 cm   LV e' lateral:   7.94 cm/s LVOT diam:     2.00 cm   LV E/e' lateral: 7.1 LV SV:         44 LV SV Index:   20 LVOT Area:     3.14 cm  RIGHT VENTRICLE            IVC RV S prime:     9.51 cm/s  IVC diam: 2.30 cm TAPSE (M-mode): 2.3 cm LEFT ATRIUM           Index        RIGHT ATRIUM           Index LA diam:      3.00 cm 1.36 cm/m   RA Area:     12.20 cm LA Vol (A2C): 31.3 ml 14.17 ml/m  RA Volume:   24.10 ml  10.91 ml/m LA Vol (A4C): 15.9 ml 7.20 ml/m  AORTIC VALVE AV Area (Vmax):    0.81 cm AV Area (Vmean):   0.83 cm AV Area (VTI):     0.79 cm AV Vmax:           325.00 cm/s AV Vmean:          211.667 cm/s AV VTI:            0.553 m AV Peak Grad:      42.2 mmHg AV Mean Grad:      25.0 mmHg LVOT Vmax:         83.87 cm/s LVOT  Vmean:        55.733 cm/s LVOT VTI:          0.139 m LVOT/AV VTI ratio: 0.25  AORTA Ao Root diam: 3.00 cm Ao Asc diam:  3.50 cm MITRAL VALVE MV Area (PHT): 3.72 cm    SHUNTS MV Decel Time: 204 msec    Systemic VTI:  0.14 m MV E velocity: 56.10 cm/s  Systemic Diam: 2.00 cm MV A velocity: 82.80 cm/s MV E/A ratio:  0.68 Darryle Decent MD Electronically signed by Darryle Decent MD Signature Date/Time: 01/08/2024/4:20:07 PM     Final    DG Chest Portable 1 View Result Date: 01/07/2024 CLINICAL DATA:  Chest pain. EXAM: PORTABLE CHEST 1 VIEW COMPARISON:  10/06/2023 FINDINGS: The heart is upper normal in size. Mediastinal contours are stable. Aortic atherosclerosis. Basilar interstitial opacities. No confluent airspace disease, pneumothorax or large pleural effusion. IMPRESSION: Basilar interstitial opacities may represent edema or infection. Electronically Signed   By: Andrea Gasman M.D.   On: 01/07/2024 15:09   DG Foot Complete Left Result Date: 12/30/2023 Please see detailed radiograph report in office note.  DG Foot Complete Right Result Date: 12/30/2023 Please see detailed radiograph report in office note.  ECHOCARDIOGRAM COMPLETE Result Date: 12/13/2023    ECHOCARDIOGRAM REPORT   Patient Name:   Rakeb Jovel Date of Exam: 12/13/2023 Medical Rec #:  [id number]     Height:       66.0 in Accession #:    [id number]    Weight:       270.0 lb Date of Birth:  1965/07/08     BSA:          2.272 m Patient Age:    58 years      BP:           149/112 mmHg Patient Gender: F             HR:           86 bpm. Exam Location:  [address] Procedure: 2D Echo, Cardiac Doppler, Color Doppler and Strain Analysis (Both            Spectral and Color Flow Doppler were utilized during procedure). Indications:    R06.09 Dyspnea  History:        Patient has prior history of Echocardiogram examinations, most                 recent 06/18/2014. Signs/Symptoms:Chest Pain; Risk                 Factors:Hypertension, Diabetes and Current Smoker.  Sonographer:    Waldo Guadalajara RCS Referring Phys: [id number] [facility] J PATWARDHAN IMPRESSIONS  1. Poor interrogation of the aortic valve, not well visualized. V max 3.6 m/s, 36 mmHG, Dru [place] 1.01 cm2, DI 0.24. Concerns for moderate to severe aortic stenosis, possibly severe. Would recommend a TEE for better characterization. The aortic valve was not well visualized. Aortic valve regurgitation is mild. Moderate to  severe aortic valve stenosis. Aortic valve area, by VTI measures 1.01 cm. Aortic valve mean gradient measures 36.0 mmHg. Aortic valve Vmax measures 3.62 m/s.  2. Left ventricular ejection fraction, by estimation, is 65 to 70%. The left ventricle has normal function. The left ventricle has no regional wall motion abnormalities. Left ventricular diastolic parameters were normal.  3. Right ventricular systolic function is normal. The right ventricular size is normal. There is normal pulmonary artery systolic pressure. The estimated right ventricular systolic pressure is 9.2 mmHg.  4.  The mitral valve is grossly normal. Trivial mitral valve regurgitation. No evidence of mitral stenosis.  5. The inferior vena cava is normal in size with greater than 50% respiratory variability, suggesting right atrial pressure of 3 mmHg. FINDINGS  Left Ventricle: Left ventricular ejection fraction, by estimation, is 65 to 70%. The left ventricle has normal function. The left ventricle has no regional wall motion abnormalities. Global longitudinal strain performed but not reported based on interpreter judgement due to suboptimal tracking. The left ventricular internal cavity size was normal in size. There is no left ventricular hypertrophy. Left ventricular diastolic parameters were normal. Right Ventricle: The right ventricular size is normal. No increase in right ventricular wall thickness. Right ventricular systolic function is normal. There is normal pulmonary artery systolic pressure. The tricuspid regurgitant velocity is 1.24 m/s, and  with an assumed right atrial pressure of 3 mmHg, the estimated right ventricular systolic pressure is 9.2 mmHg. Left Atrium: Left atrial size was normal in size. Right Atrium: Right atrial size was normal in size. Prominent Crista terminalis. Pericardium: There is no evidence of pericardial effusion. Mitral Valve: The mitral valve is grossly normal. Trivial mitral valve regurgitation. No evidence of  mitral valve stenosis. Tricuspid Valve: The tricuspid valve is grossly normal. Tricuspid valve regurgitation is not demonstrated. No evidence of tricuspid stenosis. Aortic Valve: Poor interrogation of the aortic valve, not well visualized. V max 3.6 m/s, 36 mmHG, [place] 1.01 cm2, DI 0.24. Concerns for moderate to severe aortic stenosis, possibly severe. Would recommend a TEE for better characterization. The aortic valve was not well visualized. Aortic valve regurgitation is mild. Aortic regurgitation PHT measures 549 msec. Moderate to severe aortic stenosis is present. Aortic valve mean gradient measures 36.0 mmHg. Aortic valve peak gradient measures 52.4 mmHg. Aortic valve area, by VTI measures 1.01 cm. Pulmonic Valve: The pulmonic valve was not well visualized. Pulmonic valve regurgitation is not visualized. Aorta: The aortic root and ascending aorta are structurally normal, with no evidence of dilitation. Venous: The inferior vena cava is normal in size with greater than 50% respiratory variability, suggesting right atrial pressure of 3 mmHg. IAS/Shunts: The atrial septum is grossly normal.  LEFT VENTRICLE PLAX 2D LVIDd:         4.30 cm   Diastology LVIDs:         3.20 cm   LV e' medial:    7.18 cm/s LV PW:         0.80 cm   LV E/e' medial:  13.1 LV IVS:        0.80 cm   LV e' lateral:   9.68 cm/s LVOT diam:     2.30 cm   LV E/e' lateral: 9.7 LV SV:         87 LV SV Index:   38 LVOT Area:     4.15 cm LV IVRT:       102 msec  RIGHT VENTRICLE RV Basal diam:  3.80 cm     PULMONARY VEINS RV S prime:     10.30 cm/s  A Reversal Velocity: 22.30 cm/s TAPSE (M-mode): 2.1 cm      Diastolic Velocity:  52.00 cm/s RVSP:           9.2 mmHg    S/D Velocity:        1.30                             Systolic Velocity:  67.00 cm/s LEFT ATRIUM             Index        RIGHT ATRIUM           Index LA diam:        3.40 cm 1.50 cm/m   RA Pressure: 3.00 mmHg LA Vol (A2C):   75.2 ml 33.10 ml/m  RA Area:     15.70 cm LA Vol (A4C):    28.3 ml 12.46 ml/m  RA Volume:   38.00 ml  16.73 ml/m LA Biplane Vol: 48.3 ml 21.26 ml/m  AORTIC VALVE AV Area (Vmax):    1.08 cm AV Area (Vmean):   0.91 cm AV Area (VTI):     1.01 cm AV Vmax:           362.00 cm/s AV Vmean:          293.000 cm/s AV VTI:            0.856 m AV Peak Grad:      52.4 mmHg AV Mean Grad:      36.0 mmHg LVOT Vmax:         94.20 cm/s LVOT Vmean:        64.400 cm/s LVOT VTI:          0.209 m LVOT/AV VTI ratio: 0.24 AI PHT:            549 msec  AORTA Ao Root diam: 3.10 cm Ao Asc diam:  3.40 cm MITRAL VALVE                TRICUSPID VALVE                             TR Peak grad:   6.2 mmHg MV Decel Time:              TR Vmax:        124.00 cm/s MV E velocity: 93.80 cm/s   Estimated RAP:  3.00 mmHg MV A velocity: 101.00 cm/s  RVSP:           9.2 mmHg MV E/A ratio:  0.93                             SHUNTS                             Systemic VTI:  0.21 m                             Systemic Diam: 2.30 cm Darryle Decent MD Electronically signed by Darryle Decent MD Signature Date/Time: 12/13/2023/3:32:56 PM    Final     Microbiology: Results for orders placed or performed in visit on 11/22/22  Treponemal Antibodies, TPPA     Status: None   Collection Time: 11/22/22  3:43 PM  Result Value Ref Range Status   Treponemal Antibodies, TPPA Non Reactive Non Reactive Final   Interpretation: Comment  Final    Comment: Syphilis: Treponemal Antibodies with Reflex to RPR and RPR           Titer, Reverse Screening and Diagnosis Algorithm ------------------------------------------------------------ Treponemal              Treponemal     Ab       RPR, Qn  Ab, TPPA    Final Interpretation ----------   --------   ----------   ----------------------- Non          N/A        N/A          No laboratory evidence Reactive                             of syphilis. Retest in                                      2-4 weeks if recent                                      exposure is  suspected. ----------   --------   ----------   ----------------------- Reactive     Non        Non          Treponemal antibodies              Reactive   Reactive     not confirmed.                                      Inconclusive for                                      syphilis; potential                                      early syphilis,                                      possible false                                       positive. Retest in 2-4                                      weeks if recent                                      exposure is suspected. ----------   --------   ----------   ----------------------- Reactive     Non        Reactive     Treponemal antibodies              Reactive                detected. Consistent                                      with past or  current                                      (potential early)                                      syphilis. ----------   --------   ----------   ----------------------- Reactive     >/=1:1     N/A          Treponemal and                                      nontreponemal                                      antibodies detected.                                      Consistent with current                                      or past syphilis. This test is intended ONLY for specimens that have tested positive (reactive) or equivocal for Treponema pallidum antibodies prior to submission for testing.  For the full CDC-recommended syphilis screening and diagnosis algorithm, [facility] offers test code 012005 RPR, Rfx Qn RPR/Confirm TP or 917654 T pallidum Screening Cascade.     Labs: CBC: Recent Labs  Lab 01/04/24 1504 01/07/24 1419 01/08/24 0201 01/09/24 0208 01/10/24 0238  WBC 10.8 8.5 8.2 7.2 8.8  NEUTROABS  --  5.6  --  4.3 5.6  HGB 16.1* 15.9* 15.6* 15.3* 15.4*  HCT 47.5* 47.3* 46.6* 47.1* 46.1*  MCV 92 92.6 91.9 93.6 92.0  PLT 295 270 267 255 251   Basic Metabolic Panel: Recent  Labs  Lab 01/04/24 1504 01/07/24 1419 01/08/24 0201 01/09/24 0208 01/10/24 0238  NA 134 135 137 136 134*  K 4.5 4.5 3.7 4.2 3.8  CL 93* 103 103 100 102  CO2 24 21* 24 26 23   GLUCOSE 110* 115* 128* 140* 138*  BUN 28* 18 14 14 15   CREATININE 1.56* 1.05* 0.92 1.03* 0.99  CALCIUM  10.7* 9.6 9.4 9.3 9.6  MG  --  1.6* 1.9  --   --    Liver Function Tests: Recent Labs  Lab 01/04/24 1504 01/07/24 1419  AST 15 25  ALT 20 25  ALKPHOS 86 69  BILITOT 0.4 1.0  PROT 6.7 6.6  ALBUMIN 4.2 3.5   CBG: Recent Labs  Lab 01/09/24 1052 01/09/24 1553 01/09/24 2119 01/10/24 0602 01/10/24 1119  GLUCAP 138* 138* 148* 168* 149*    Discharge time spent: greater than 30 minutes.  Signed: Lynasia Meloche, DO [facility] 01/11/2024

## 2024-01-16 ENCOUNTER — Telehealth: Payer: Self-pay

## 2024-01-16 NOTE — Telephone Encounter (Signed)
 Signed Standard Written Order, Detailed Product Description and Letter of Medical Necessity faxed to sarah Lassiter/ [facility]: [phone].  I also included a copy of the last visit note with Dr Vicci, 12/20/2023 and asked Sarah to review that and let me know if that note is sufficient.

## 2024-01-17 ENCOUNTER — Ambulatory Visit
Admission: RE | Admit: 2024-01-17 | Discharge: 2024-01-17 | Disposition: A | Discharge status: Home / Self Care | Source: Ambulatory Visit | Attending: Internal Medicine | Admitting: Internal Medicine

## 2024-01-17 DIAGNOSIS — G8929 Other chronic pain: Secondary | ICD-10-CM

## 2024-01-17 DIAGNOSIS — M48061 Spinal stenosis, lumbar region without neurogenic claudication: Secondary | ICD-10-CM | POA: Diagnosis not present

## 2024-01-17 DIAGNOSIS — R269 Unspecified abnormalities of gait and mobility: Secondary | ICD-10-CM

## 2024-01-17 DIAGNOSIS — M4316 Spondylolisthesis, lumbar region: Secondary | ICD-10-CM | POA: Diagnosis not present

## 2024-01-17 DIAGNOSIS — M5116 Intervertebral disc disorders with radiculopathy, lumbar region: Secondary | ICD-10-CM | POA: Diagnosis not present

## 2024-01-17 DIAGNOSIS — M4727 Other spondylosis with radiculopathy, lumbosacral region: Secondary | ICD-10-CM | POA: Diagnosis not present

## 2024-01-19 ENCOUNTER — Telehealth: Payer: Self-pay

## 2024-01-19 NOTE — Telephone Encounter (Signed)
 Orthotics are here Community Education Officer form signed and on file Appt set for 11/13

## 2024-01-21 ENCOUNTER — Ambulatory Visit: Payer: Self-pay | Admitting: Internal Medicine

## 2024-01-21 ENCOUNTER — Other Ambulatory Visit: Payer: Self-pay | Admitting: Internal Medicine

## 2024-01-21 DIAGNOSIS — M48062 Spinal stenosis, lumbar region with neurogenic claudication: Secondary | ICD-10-CM

## 2024-01-23 ENCOUNTER — Other Ambulatory Visit: Payer: Self-pay | Admitting: Internal Medicine

## 2024-01-23 ENCOUNTER — Telehealth: Payer: Self-pay | Admitting: Licensed Clinical Social Worker

## 2024-01-23 DIAGNOSIS — E1142 Type 2 diabetes mellitus with diabetic polyneuropathy: Secondary | ICD-10-CM

## 2024-01-23 NOTE — Telephone Encounter (Signed)
 H&V Care Navigation CSW Progress Note  Clinical Social Worker contacted patient by phone to f/u on community resources sent, pt reached at [phone]. Pt shares that she and her husband are doing okay, she is aware of referral made for electric wheelchair from PCP team. She is appreciative of the resources for dentists, was able to get needed extractions and they have submitted for dentures. If we hear of any white flag or other winter shelter I shared I would let her know- she feels okay hunkering down with her spouse for the evening. Encouraged her to call me if any additional resources needed.  Patient is participating in a Managed Medicaid Plan:  Yes- healthy blue  SDOH Screenings   Food Insecurity: Food Insecurity Present (01/07/2024)  Housing: High Risk (01/07/2024)  Transportation Needs: Unmet Transportation Needs (01/07/2024)  Utilities: At Risk (01/07/2024)  Alcohol Screen: Low Risk  (08/02/2023)  Depression (PHQ2-9): High Risk (12/20/2023)  Financial Resource Strain: High Risk (01/05/2024)  Physical Activity: Insufficiently Active (08/02/2023)  Social Connections: Socially Isolated (08/02/2023)  Stress: Stress Concern Present (08/02/2023)  Tobacco Use: High Risk (01/07/2024)  Health Literacy: Adequate Health Literacy (01/05/2024)    Marit Lark, MSW, LCSW Clinical Social Worker II [facility] Heart/Vascular Care Navigation  [phone]- work cell phone (preferred)

## 2024-01-25 ENCOUNTER — Other Ambulatory Visit: Payer: Self-pay | Admitting: Internal Medicine

## 2024-01-25 ENCOUNTER — Ambulatory Visit
Admission: RE | Admit: 2024-01-25 | Discharge: 2024-01-25 | Disposition: A | Discharge status: Home / Self Care | Source: Ambulatory Visit | Attending: Internal Medicine | Admitting: Internal Medicine

## 2024-01-25 DIAGNOSIS — M1611 Unilateral primary osteoarthritis, right hip: Secondary | ICD-10-CM | POA: Diagnosis not present

## 2024-01-25 DIAGNOSIS — R269 Unspecified abnormalities of gait and mobility: Secondary | ICD-10-CM

## 2024-01-25 DIAGNOSIS — M25551 Pain in right hip: Secondary | ICD-10-CM

## 2024-01-25 DIAGNOSIS — G8929 Other chronic pain: Secondary | ICD-10-CM

## 2024-01-25 DIAGNOSIS — F332 Major depressive disorder, recurrent severe without psychotic features: Secondary | ICD-10-CM

## 2024-01-25 DIAGNOSIS — J449 Chronic obstructive pulmonary disease, unspecified: Secondary | ICD-10-CM

## 2024-01-25 DIAGNOSIS — Z9181 History of falling: Secondary | ICD-10-CM

## 2024-01-25 DIAGNOSIS — R0609 Other forms of dyspnea: Secondary | ICD-10-CM

## 2024-01-25 DIAGNOSIS — M47812 Spondylosis without myelopathy or radiculopathy, cervical region: Secondary | ICD-10-CM | POA: Diagnosis not present

## 2024-01-26 ENCOUNTER — Ambulatory Visit

## 2024-01-26 NOTE — Progress Notes (Signed)
 Patient presents today to pick up custom molded foot orthotics, diagnosed with pes planus by Dr. Magdalen.   Orthotics were dispensed and fit was satisfactory. Reviewed instructions for break-in and wear. Written instructions given to patient.  Patient will follow up as needed. Lolita Schultze Cped, CFo, CFm

## 2024-01-28 ENCOUNTER — Ambulatory Visit: Payer: Self-pay | Admitting: Internal Medicine

## 2024-01-30 ENCOUNTER — Other Ambulatory Visit: Payer: Self-pay | Admitting: Internal Medicine

## 2024-01-30 DIAGNOSIS — J449 Chronic obstructive pulmonary disease, unspecified: Secondary | ICD-10-CM

## 2024-01-30 MED ORDER — BUDESONIDE-FORMOTEROL FUMARATE 80-4.5 MCG/ACT IN AERO: 2.0000 | INHALATION_SPRAY | Freq: Two times a day (BID) | RESPIRATORY_TRACT | 3 refills | Status: DC

## 2024-01-31 ENCOUNTER — Encounter: Payer: Self-pay | Admitting: Obstetrics and Gynecology

## 2024-01-31 ENCOUNTER — Other Ambulatory Visit (HOSPITAL_COMMUNITY)
Admission: RE | Admit: 2024-01-31 | Discharge: 2024-01-31 | Disposition: A | Source: Ambulatory Visit | Attending: Obstetrics and Gynecology | Admitting: Obstetrics and Gynecology

## 2024-01-31 ENCOUNTER — Ambulatory Visit: Admitting: Obstetrics and Gynecology

## 2024-01-31 VITALS — BP 123/85 | HR 103 | Wt 260.8 lb

## 2024-01-31 DIAGNOSIS — Z113 Encounter for screening for infections with a predominantly sexual mode of transmission: Secondary | ICD-10-CM

## 2024-01-31 DIAGNOSIS — Z124 Encounter for screening for malignant neoplasm of cervix: Secondary | ICD-10-CM

## 2024-01-31 DIAGNOSIS — Z01419 Encounter for gynecological examination (general) (routine) without abnormal findings: Secondary | ICD-10-CM | POA: Diagnosis not present

## 2024-01-31 DIAGNOSIS — Z1231 Encounter for screening mammogram for malignant neoplasm of breast: Secondary | ICD-10-CM

## 2024-01-31 NOTE — Progress Notes (Signed)
 GYNECOLOGY ANNUAL PREVENTATIVE CARE ENCOUNTER NOTE  Subjective:   Tracy Fitzgerald is a 58 y.o. [phone] female here for a annual gynecologic exam. Current complaints: needs pap. Has not had one in several years, h/o LEEP and knows last pap was abnormal. Mother died of uterine cancer.   Denies abnormal vaginal bleeding, discharge, pelvic pain, problems with intercourse or other gynecologic concerns. Requests STI screen.   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 - HIGH GRADE SQUAMOUS DYSPLASIA IS FOCALLY PRESENT AT THE ENDOCERVICAL MARGIN IN THE 9-12 O'CLOCK QUADRANT.   Gynecologic History Patient's last menstrual period was 11/23/2015. Contraception: post menopausal status Last Pap: 2023. Results: HSIL Last mammogram: 08/2023. Results: Birads I  Obstetric History OB History  Gravida Para Term Preterm AB Living  6 6 6   7   SAB IAB Ectopic Multiple Live Births     1 7    # Outcome Date GA Lbr Len/2nd Weight Sex Type Anes PTL Lv  6 Term           5 Term           4 Term           3 Term           2 Term           1 Term             Past Medical History:  Diagnosis Date   Aortic stenosis    Asthma    CHF (congestive heart failure) (HCC)    Coronary artery calcification seen on CT scan    Depression    Diabetes mellitus without complication (HCC)    Headache    Hypertension    LAFB (left anterior fascicular block)    Pain disorder    PTSD (post-traumatic stress disorder)    Sleep apnea    CPAP    Past Surgical History:  Procedure Laterality Date   ADENOIDECTOMY     COLONOSCOPY WITH PROPOFOL  N/A 05/24/2017   Procedure: COLONOSCOPY WITH PROPOFOL ;  Surgeon: Albertus Gordy HERO, MD;  Location: [facility] END[facility];  Service: Gastroenterology;  Laterality: N/A;   TONSILLECTOMY      TRANSESOPHAGEAL ECHOCARDIOGRAM (CATH LAB) N/A 01/09/2024   Procedure: TRANSESOPHAGEAL ECHOCARDIOGRAM;  Surgeon: Ren Donley Joelle VEAR, MD;  Location: [facility] INVASIVE CV LAB;  Service: Cardiovascular;  Laterality: N/A;    Current Outpatient Medications on File Prior to Visit  Medication Sig Dispense Refill   Accu-Chek Softclix Lancets lancets Test blood sugar once a day 100 each 12   albuterol  (VENTOLIN  HFA) 108 (90 Base) MCG/ACT inhaler INHALE 2 PUFFS INTO THE LUNGS EVERY 4 HOURS AS NEEDED FOR WHEEZING OR SHORTNESS OF BREATH 18 each 6   ARIPiprazole  (ABILIFY ) 10 MG tablet Take 1 tablet (10 mg total) by mouth daily. 30 tablet 2   aspirin  EC 81 MG tablet Take 1 tablet (81 mg total) by mouth daily. Swallow whole. 30 tablet 11   atorvastatin  (LIPITOR) 80 MG tablet Take 1 tablet (80 mg total) by mouth daily. 30 tablet 0   Blood Glucose Monitoring Suppl (ACCU-CHEK GUIDE) w/Device KIT Test blood sugar once a day 1 kit 0   budesonide-formoterol (SYMBICORT) 80-4.5 MCG/ACT inhaler Inhale 2 puffs into the lungs 2 (two) times daily. 1 each 3   buPROPion  (ZYBAN ) 150 MG 12 hr tablet TAKE  1 TABLET BY MOUTH TWICE A DAY 180 tablet 0   Carboxymethylcellulose Sodium (EYE DROPS OP) Place 1 drop into both eyes daily as needed (dry eyes). Multi- purpose eye drops saline     furosemide  (LASIX ) 40 MG tablet Take 1 tablet (40 mg total) by mouth daily. 30 tablet 0   gabapentin  (NEURONTIN ) 300 MG capsule Take 2 capsules (600 mg total) by mouth 3 (three) times daily AND 3 capsules (900 mg total) at bedtime. 270 capsule 5   glucose blood (ACCU-CHEK GUIDE) test strip Test blood sugar once a day 100 each 12   lidocaine  (LIDODERM ) 5 % PLACE 1 PATCH ONTO SKIN DAILY TO LOWER BACK, REMOVE AFTER 12 HOURS 30 patch 1   lisinopril  (ZESTRIL ) 5 MG tablet Take 1 tablet (5 mg total) by mouth daily. 90 tablet 1   metFORMIN  (GLUCOPHAGE -XR) 500 MG 24 hr tablet Take 1 tablet (500 mg total) by mouth 2 (two) times daily with a meal. 180 tablet 2    potassium chloride  SA (KLOR-CON  M) 20 MEQ tablet Take 1 tablet (20 mEq total) by mouth daily. 90 tablet 3   Semaglutide ,0.25 or 0.5MG /DOS, (OZEMPIC , 0.25 OR 0.5 MG/DOSE,) 2 MG/3ML SOPN Inject 0.25 mg into the skin once a week. 3 mL 1   sertraline  (ZOLOFT ) 100 MG tablet Take 2 tablets (200 mg total) by mouth daily. 90 tablet 1   meloxicam  (MOBIC ) 15 MG tablet Take 1 tablet (15 mg total) by mouth daily as needed for pain (Patient not taking: Reported on 01/31/2024) 30 tablet 3   nicotine  (NICODERM CQ  - DOSED IN MG/24 HOURS) 21 mg/24hr patch Place 1 patch (21 mg total) onto the skin daily. (Patient not taking: Reported on 01/31/2024) 28 patch 0   No current facility-administered medications on file prior to visit.    Allergies  Allergen Reactions   Cymbalta  [Duloxetine  Hcl]     Urinary retention    Social History   Socioeconomic History   Marital status: Married    Spouse name: Not on file   Number of children: Not on file   Years of education: Not on file   Highest education level: Not on file  Occupational History   Not on file  Tobacco Use   Smoking status: Every Day    Current packs/day: 1.00    Average packs/day: 1 pack/day for 33.0 years (33.0 ttl pk-yrs)    Types: Cigarettes   Smokeless tobacco: Current  Vaping Use   Vaping status: Never Used  Substance and Sexual Activity   Alcohol use: Not Currently    Comment: occ. monthly   Drug use: No   Sexual activity: Yes    Partners: Male    Birth control/protection: Post-menopausal  Other Topics Concern   Not on file  Social History Narrative   Not on file   Social Drivers of Health   Financial Resource Strain: High Risk (01/05/2024)   Overall Financial Resource Strain (CARDIA)    Difficulty of Paying Living Expenses: Very hard  Food Insecurity: Food Insecurity Present (01/07/2024)   Hunger Vital Sign    Worried About Running Out of Food in the Last Year: Often true    Ran Out of Food in the Last Year: Sometimes  true  Transportation Needs: Unmet Transportation Needs (01/07/2024)   PRAPARE - Transportation    Lack of Transportation (Medical): Yes    Lack of Transportation (Non-Medical): Yes  Physical Activity: Insufficiently Active (08/02/2023)   Exercise Vital Sign    Days of Exercise  per Week: 1 day    Minutes of Exercise per Session: 10 min  Stress: Stress Concern Present (08/02/2023)   [employer] of Occupational Health - Occupational Stress Questionnaire    Feeling of Stress : Very much  Social Connections: Socially Isolated (08/02/2023)   Social Connection and Isolation Panel    Frequency of Communication with Friends and Family: Never    Frequency of Social Gatherings with Friends and Family: Never    Attends Religious Services: Never    Database Administrator or Organizations: No    Attends Banker Meetings: Never    Marital Status: Married  Catering Manager Violence: Not At Risk (01/07/2024)   Humiliation, Afraid, Rape, and Kick questionnaire    Fear of Current or Ex-Partner: No    Emotionally Abused: No    Physically Abused: No    Sexually Abused: No    Family History  Problem Relation Age of Onset   Hypertension Mother    Diabetes Mother    Uterine cancer Mother    Heart disease Mother    Cancer Mother        UTERINE CANCER    Arthritis Mother    Depression Mother    Hypertension Father    Kidney disease Father    ADD / ADHD Son    ADD / ADHD Daughter    Breast cancer Neg Hx    Colon cancer Neg Hx      The following portions of the patient's history were reviewed and updated as appropriate: allergies, current medications, past family history, past medical history, past social history, past surgical history and problem list.  Review of Systems Pertinent items are noted in HPI.   Objective:  BP 123/85   Pulse (!) 103   Wt 260 lb 12.8 oz (118.3 kg)   LMP 11/23/2015   BMI 42.09 kg/m  CONSTITUTIONAL: Well-developed, well-nourished female in no  acute distress. Using a walker for assistance but able to move off and on table fairly easily HENT:  Normocephalic, atraumatic, External right and left ear normal. Oropharynx is clear and moist EYES: Conjunctivae and EOM are normal. Pupils are equal, round, and reactive to light. No scleral icterus.  NECK: Normal range of motion, supple, no masses.  Normal thyroid.  SKIN: Skin is warm and dry. No rash noted. Not diaphoretic. No erythema. No pallor. NEUROLOGIC: Alert and oriented to person, place, and time. Normal reflexes, muscle tone coordination. No cranial nerve deficit noted. PSYCHIATRIC: Normal mood and affect. Normal behavior. Normal judgment and thought content. CARDIOVASCULAR: Normal heart rate noted RESPIRATORY: Effort normal, no problems with respiration noted. BREASTS: Symmetric in size. No masses, skin changes, nipple drainage, or lymphadenopathy. ABDOMEN: Soft, large umbilical hernia present, no distention noted.  No tenderness, rebound or guarding.  PELVIC: Normal appearing external genitalia; normal appearing vaginal mucosa and cervix with some vaginal sidewall prolapse, No abnormal discharge noted.  Pap smear obtained. Pelvic cultures obtained. Normal uterine size, no other palpable masses, no uterine or adnexal tenderness. MUSCULOSKELETAL: limited range of motion. No tenderness.  No cyanosis, clubbing. Mild edema   Exam done with chaperone present.  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 - HIGH GRADE SQUAMOUS DYSPLASIA IS FOCALLY PRESENT AT THE ENDOCERVICAL MARGIN IN THE 9-12 O'CLOCK QUADRANT.  Assessment and Plan:  1. Well woman exam (Primary) Normal female exam Reviewed anticipatory guidance for screening for ca  2. Cervical  cancer screening Given h/o LEEP with f/u HSIL, rec we proceed with scheduling colposcopy regardless of pap outcome, she is agreeable -return for colpo  3. Encounter for screening mammogram for malignant neoplasm of breast Up to date  4. Routine screening for STI (sexually transmitted infection) - Cervicovaginal ancillary only( [place]) - HIV antibody (with reflex) - Hepatitis B Surface AntiGEN - Hepatitis C Antibody - RPR    Will follow up results of pap smear/STI screen and manage accordingly. Encouraged improvement in diet and exercise.    Routine preventative health maintenance measures emphasized. Please refer to After Visit Summary for other counseling recommendations.    LOIS Yolanda Moats, MD, [facility] Attending Center for [facility] [facility])

## 2024-01-31 NOTE — Progress Notes (Signed)
 Scored 22 on PHQ and 16 on GAD. Pt states she sees a therapist/psychiatrist  already.   Here for PAP. Last one in 02-07-22 was abnormal. Mother died of uterine cancer.  Diabetic.  No other concerns today.
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
## 2024-01-31 NOTE — Telephone Encounter (Signed)
 I spoke to [employer] [facility] : [phone] to obtain an update on the status of the order for the power wheelchair.  She confirmed receipt of all signed documents from Dr Vicci and said they are just waiting on a prior authorization from [employer]

## 2024-02-01 ENCOUNTER — Encounter (HOSPITAL_COMMUNITY): Payer: Self-pay | Admitting: Psychiatry

## 2024-02-01 ENCOUNTER — Ambulatory Visit: Payer: Self-pay | Admitting: Obstetrics and Gynecology

## 2024-02-01 ENCOUNTER — Telehealth (INDEPENDENT_AMBULATORY_CARE_PROVIDER_SITE_OTHER): Admitting: Psychiatry

## 2024-02-01 DIAGNOSIS — F411 Generalized anxiety disorder: Secondary | ICD-10-CM | POA: Diagnosis not present

## 2024-02-01 DIAGNOSIS — F4310 Post-traumatic stress disorder, unspecified: Secondary | ICD-10-CM

## 2024-02-01 DIAGNOSIS — F331 Major depressive disorder, recurrent, moderate: Secondary | ICD-10-CM | POA: Diagnosis not present

## 2024-02-01 DIAGNOSIS — E1159 Type 2 diabetes mellitus with other circulatory complications: Secondary | ICD-10-CM | POA: Diagnosis not present

## 2024-02-01 DIAGNOSIS — N3946 Mixed incontinence: Secondary | ICD-10-CM | POA: Diagnosis not present

## 2024-02-01 LAB — CERVICOVAGINAL ANCILLARY ONLY
Bacterial Vaginitis (gardnerella): POSITIVE — AB
Candida Glabrata: POSITIVE — AB
Candida Vaginitis: NEGATIVE
Chlamydia: NEGATIVE
Comment: NEGATIVE
Comment: NEGATIVE
Comment: NEGATIVE
Comment: NEGATIVE
Comment: NEGATIVE
Comment: NORMAL
Neisseria Gonorrhea: NEGATIVE
Trichomonas: NEGATIVE

## 2024-02-01 LAB — HEPATITIS C ANTIBODY: Hep C Virus Ab: NONREACTIVE

## 2024-02-01 LAB — HIV ANTIBODY (ROUTINE TESTING W REFLEX): HIV Screen 4th Generation wRfx: NONREACTIVE

## 2024-02-01 LAB — RPR: RPR Ser Ql: NONREACTIVE

## 2024-02-01 LAB — HEPATITIS B SURFACE ANTIGEN: Hepatitis B Surface Ag: NEGATIVE

## 2024-02-01 MED ORDER — METRONIDAZOLE 500 MG PO TABS: 500.0000 mg | ORAL_TABLET | Freq: Two times a day (BID) | ORAL | 0 refills | Status: DC

## 2024-02-01 MED ORDER — HYDROXYZINE HCL 25 MG PO TABS: 25.0000 mg | ORAL_TABLET | Freq: Two times a day (BID) | ORAL | 2 refills | Status: DC | PRN

## 2024-02-01 MED ORDER — SERTRALINE HCL 100 MG PO TABS: 200.0000 mg | ORAL_TABLET | Freq: Every day | ORAL | 1 refills | Status: DC

## 2024-02-01 MED ORDER — ARIPIPRAZOLE 10 MG PO TABS: 10.0000 mg | ORAL_TABLET | Freq: Every day | ORAL | 1 refills | Status: DC

## 2024-02-01 NOTE — Patient Instructions (Signed)
 Thank you for attending your appointment today.  -- START Atarax 12.5-25 mg twice daily as needed for anxiety -- Continue other medications as prescribed.  Please do not make any changes to medications without first discussing with your provider. If you are experiencing a psychiatric emergency, please call 911 or present to your nearest emergency department. Additional crisis, medication management, and therapy resources are included below.  [facility]  [address], [place], [place] [address] [phone] WALK-IN URGENT CARE 24/7 FOR ANYONE [address], [place], [place]  [phone] Fax: [phone] [email or web address] *Interpreters available *Accepts all insurance and uninsured for Urgent Care needs *Accepts Medicaid and uninsured for outpatient treatment (below)      ONLY FOR [facility]  Below:    Outpatient New Patient Assessment/Therapy Walk-ins:        Monday, Wednesday, and Thursday 8am until slots are full (first come, first served)                   New Patient Psychiatry/Medication Management        Monday-Friday 8am-11am (first come, first served)               For all walk-ins we ask that you arrive by 7:15am, because patients will be seen in the order of arrival.

## 2024-02-02 ENCOUNTER — Other Ambulatory Visit: Payer: Self-pay

## 2024-02-02 ENCOUNTER — Encounter: Payer: Self-pay | Admitting: Obstetrics and Gynecology

## 2024-02-02 LAB — CYTOLOGY - PAP
Comment: NEGATIVE
Comment: NEGATIVE
Comment: NEGATIVE
Diagnosis: HIGH — AB
HPV 16: NEGATIVE
HPV 18 / 45: NEGATIVE
High risk HPV: POSITIVE — AB

## 2024-02-02 MED ORDER — BORIC ACID CRYS: 600.0000 mg | CRYSTALS | Freq: Every day | 0 refills | Status: AC

## 2024-02-03 ENCOUNTER — Other Ambulatory Visit: Payer: Self-pay | Admitting: Internal Medicine

## 2024-02-03 DIAGNOSIS — M1611 Unilateral primary osteoarthritis, right hip: Secondary | ICD-10-CM
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
## 2024-02-08 NOTE — Telephone Encounter (Signed)
 I spoke to [employer] and she confirmed that they received approval from the patient's insurance company for the power wheelchair and they working with Selinda to process the order.

## 2024-02-13 ENCOUNTER — Encounter: Payer: Self-pay | Admitting: Neurosurgery

## 2024-02-13 ENCOUNTER — Ambulatory Visit: Admitting: Neurosurgery

## 2024-02-13 VITALS — BP 120/78 | Ht 66.0 in | Wt 256.4 lb

## 2024-02-13 DIAGNOSIS — M48062 Spinal stenosis, lumbar region with neurogenic claudication: Secondary | ICD-10-CM | POA: Insufficient documentation

## 2024-02-13 DIAGNOSIS — M419 Scoliosis, unspecified: Secondary | ICD-10-CM | POA: Diagnosis not present

## 2024-02-13 DIAGNOSIS — M4150 Other secondary scoliosis, site unspecified: Secondary | ICD-10-CM | POA: Insufficient documentation

## 2024-02-15 ENCOUNTER — Other Ambulatory Visit: Payer: Self-pay | Admitting: Internal Medicine

## 2024-02-15 ENCOUNTER — Ambulatory Visit: Attending: Cardiology | Admitting: Cardiology

## 2024-02-15 DIAGNOSIS — I5030 Unspecified diastolic (congestive) heart failure: Secondary | ICD-10-CM | POA: Diagnosis not present

## 2024-02-15 DIAGNOSIS — E1142 Type 2 diabetes mellitus with diabetic polyneuropathy: Secondary | ICD-10-CM | POA: Diagnosis not present

## 2024-02-15 DIAGNOSIS — G8929 Other chronic pain: Secondary | ICD-10-CM

## 2024-02-15 DIAGNOSIS — M48062 Spinal stenosis, lumbar region with neurogenic claudication: Secondary | ICD-10-CM

## 2024-02-21 ENCOUNTER — Ambulatory Visit: Admitting: Orthopaedic Surgery

## 2024-02-29 ENCOUNTER — Encounter: Admitting: Obstetrics & Gynecology

## 2024-03-02 ENCOUNTER — Ambulatory Visit (INDEPENDENT_AMBULATORY_CARE_PROVIDER_SITE_OTHER): Admitting: Clinical

## 2024-03-02 DIAGNOSIS — F331 Major depressive disorder, recurrent, moderate: Secondary | ICD-10-CM | POA: Diagnosis not present

## 2024-03-02 DIAGNOSIS — F4310 Post-traumatic stress disorder, unspecified: Secondary | ICD-10-CM | POA: Diagnosis not present

## 2024-03-03 DIAGNOSIS — N3946 Mixed incontinence: Secondary | ICD-10-CM | POA: Diagnosis not present

## 2024-03-03 DIAGNOSIS — E1159 Type 2 diabetes mellitus with other circulatory complications: Secondary | ICD-10-CM | POA: Diagnosis not present

## 2024-03-06 ENCOUNTER — Ambulatory Visit: Admitting: Orthopaedic Surgery

## 2024-03-14 ENCOUNTER — Other Ambulatory Visit: Payer: Self-pay | Admitting: Internal Medicine

## 2024-03-14 DIAGNOSIS — F411 Generalized anxiety disorder: Secondary | ICD-10-CM

## 2024-03-21 ENCOUNTER — Encounter: Admitting: Obstetrics and Gynecology

## 2024-03-22 ENCOUNTER — Encounter: Payer: Self-pay | Admitting: Physical Medicine & Rehabilitation

## 2024-03-23 ENCOUNTER — Telehealth (HOSPITAL_COMMUNITY): Admitting: Student in an Organized Health Care Education/Training Program

## 2024-03-23 ENCOUNTER — Telehealth (HOSPITAL_COMMUNITY): Payer: Self-pay | Admitting: Student in an Organized Health Care Education/Training Program

## 2024-03-23 DIAGNOSIS — F331 Major depressive disorder, recurrent, moderate: Secondary | ICD-10-CM

## 2024-03-23 DIAGNOSIS — F411 Generalized anxiety disorder: Secondary | ICD-10-CM

## 2024-03-23 MED ORDER — PROPRANOLOL HCL 10 MG PO TABS: 10.0000 mg | ORAL_TABLET | Freq: Two times a day (BID) | ORAL | 2 refills | Status: AC | PRN

## 2024-03-23 MED ORDER — SERTRALINE HCL 100 MG PO TABS: 200.0000 mg | ORAL_TABLET | Freq: Every day | ORAL | 1 refills | Status: AC

## 2024-03-23 MED ORDER — ARIPIPRAZOLE 10 MG PO TABS: 10.0000 mg | ORAL_TABLET | Freq: Every day | ORAL | 1 refills | Status: AC

## 2024-03-23 MED ORDER — BUPROPION HCL ER (SR) 150 MG PO TB12: 150.0000 mg | ORAL_TABLET | Freq: Two times a day (BID) | ORAL | 2 refills | Status: AC

## 2024-03-23 NOTE — Telephone Encounter (Signed)
 Contacted and spoke with patient at her mobile number. She has a follow up appointment with me on 04/27/2023 and has been doing well with her regimen with the exception of hydroxyzine .  She reports she is unable to tolerate the side effects of hydroxyzine , reports dry mouth and excess sedation.  She was amenable to trialing as needed propranolol  until follow-up appointment.  All questions were answered.   -- Continue Zoloft  200 mg daily -- Continue Wellbutrin  SR 150 mg BID -- Continue Abilify  10 mg daily (i10/1/25) -- Stop hydroxyzine  due to adverse effects -- Start Propranolol  10 mg twice daily as needed for acute anxiety   Arty Lantzy Carrin Carrero, MD PGY-3, [facility]
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
## 2024-03-25 NOTE — Progress Notes (Signed)
 This encounter was created in error - please disregard.

## 2024-03-26 ENCOUNTER — Ambulatory Visit (INDEPENDENT_AMBULATORY_CARE_PROVIDER_SITE_OTHER): Admitting: Clinical

## 2024-03-26 DIAGNOSIS — F331 Major depressive disorder, recurrent, moderate: Secondary | ICD-10-CM

## 2024-03-26 NOTE — Progress Notes (Signed)
 Client did not show for virtual therapy appt.

## 2024-04-04 ENCOUNTER — Ambulatory Visit (HOSPITAL_BASED_OUTPATIENT_CLINIC_OR_DEPARTMENT_OTHER): Admitting: Pulmonary Disease

## 2024-04-06 ENCOUNTER — Encounter: Payer: Self-pay | Admitting: Internal Medicine

## 2024-04-06 ENCOUNTER — Ambulatory Visit: Attending: Internal Medicine | Admitting: Internal Medicine

## 2024-04-06 DIAGNOSIS — Z7984 Long term (current) use of oral hypoglycemic drugs: Secondary | ICD-10-CM

## 2024-04-06 DIAGNOSIS — E1159 Type 2 diabetes mellitus with other circulatory complications: Secondary | ICD-10-CM

## 2024-04-06 DIAGNOSIS — G8929 Other chronic pain: Secondary | ICD-10-CM

## 2024-04-06 DIAGNOSIS — E119 Type 2 diabetes mellitus without complications: Secondary | ICD-10-CM

## 2024-04-06 DIAGNOSIS — E1169 Type 2 diabetes mellitus with other specified complication: Secondary | ICD-10-CM

## 2024-04-06 DIAGNOSIS — Z6841 Body Mass Index (BMI) 40.0 and over, adult: Secondary | ICD-10-CM

## 2024-04-06 DIAGNOSIS — Q2381 Bicuspid aortic valve: Secondary | ICD-10-CM

## 2024-04-06 DIAGNOSIS — F411 Generalized anxiety disorder: Secondary | ICD-10-CM

## 2024-04-06 DIAGNOSIS — F17200 Nicotine dependence, unspecified, uncomplicated: Secondary | ICD-10-CM

## 2024-04-06 DIAGNOSIS — M5441 Lumbago with sciatica, right side: Secondary | ICD-10-CM

## 2024-04-06 DIAGNOSIS — I350 Nonrheumatic aortic (valve) stenosis: Secondary | ICD-10-CM

## 2024-04-06 DIAGNOSIS — J439 Emphysema, unspecified: Secondary | ICD-10-CM

## 2024-04-06 DIAGNOSIS — F331 Major depressive disorder, recurrent, moderate: Secondary | ICD-10-CM

## 2024-04-06 DIAGNOSIS — I10 Essential (primary) hypertension: Secondary | ICD-10-CM

## 2024-04-06 DIAGNOSIS — Z7985 Long-term (current) use of injectable non-insulin antidiabetic drugs: Secondary | ICD-10-CM

## 2024-04-06 LAB — POCT GLYCOSYLATED HEMOGLOBIN (HGB A1C): HbA1c, POC (controlled diabetic range): 7.2 % — AB (ref 0.0–7.0)

## 2024-04-06 LAB — GLUCOSE, POCT (MANUAL RESULT ENTRY): POC Glucose: 126 mg/dL — AB (ref 70–99)

## 2024-04-06 MED ORDER — LISINOPRIL 5 MG PO TABS: 5.0000 mg | ORAL_TABLET | Freq: Every day | ORAL | 1 refills | Status: AC

## 2024-04-06 MED ORDER — FUROSEMIDE 40 MG PO TABS: 40.0000 mg | ORAL_TABLET | Freq: Every day | ORAL | 1 refills | Status: AC

## 2024-04-06 MED ORDER — METFORMIN HCL ER 500 MG PO TB24: 500.0000 mg | ORAL_TABLET | Freq: Two times a day (BID) | ORAL | 2 refills | Status: AC

## 2024-04-06 MED ORDER — ALBUTEROL SULFATE HFA 108 (90 BASE) MCG/ACT IN AERS: 2.0000 | INHALATION_SPRAY | RESPIRATORY_TRACT | 6 refills | Status: AC | PRN

## 2024-04-06 MED ORDER — ATORVASTATIN CALCIUM 80 MG PO TABS: 80.0000 mg | ORAL_TABLET | Freq: Every day | ORAL | 1 refills | Status: AC

## 2024-04-06 MED ORDER — LIDOCAINE 5 % EX PTCH: MEDICATED_PATCH | CUTANEOUS | 1 refills | Status: AC

## 2024-04-06 MED ORDER — SEMAGLUTIDE (1 MG/DOSE) 4 MG/3ML ~~LOC~~ SOPN: 1.0000 mg | PEN_INJECTOR | SUBCUTANEOUS | 3 refills | Status: AC

## 2024-04-06 MED ORDER — BUDESONIDE-FORMOTEROL FUMARATE 80-4.5 MCG/ACT IN AERO: 2.0000 | INHALATION_SPRAY | Freq: Two times a day (BID) | RESPIRATORY_TRACT | 3 refills | Status: AC
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
## 2024-04-06 NOTE — Patient Instructions (Signed)
" °  VISIT SUMMARY: During your follow-up appointment, we discussed your ongoing health issues, including lumbar scoliosis, diabetes, hypertension, congestive heart failure, and mental health concerns. We reviewed your current treatments and made adjustments where necessary. We also discussed the importance of rescheduling your physical therapy and continuing with your mental health support.  YOUR PLAN: -LUMBAR DEGENERATIVE SCOLIOSIS WITH RADICULOPATHY AND CHRONIC PAIN: This condition involves the degeneration of the spine, causing nerve pain and mobility issues. You need to reschedule your physical therapy appointment as recommended by your neurosurgeon. Follow up with the neurosurgeon after your physical therapy evaluation. If physical therapy does not provide enough relief, we may consider referring you to pain management.  -TYPE 2 DIABETES MELLITUS ASSOCIATED WITH OBESITY: Type 2 diabetes is a condition where your body does not use insulin  properly, leading to high blood sugar levels. Your A1c has improved from 9.0 to 7.2, and your current blood sugar is 126 mg/dL. Continue taking metformin  500 mg twice daily and increase Ozempic  to 1 mg after your current supply is finished. Work on reducing sweets in your diet. Your diabetic eye exam is scheduled for April.  -HYPERTENSION ASSOCIATED WITH TYPE 2 DIABETES MELLITUS: Hypertension, or high blood pressure, is being managed well with your current medications. Your blood pressure today is 118/84 mmHg. Continue taking lisinopril  5 mg daily and furosemide  40 mg daily.  -CONGESTIVE HEART FAILURE WITH MODERATE AORTIC STENOSIS DUE TO BICUSPID AORTIC VALVE: Congestive heart failure is a condition where your heart does not pump blood as well as it should. You also have moderate aortic stenosis due to a bicuspid aortic valve. Your heart function is currently 50-55%. Continue taking lisinopril  5 mg daily and furosemide  40 mg daily. We will continue to monitor your heart  with annual echocardiograms. Your cardiologist anticipates that you may need an aortic valve replacement in 2-3 years.  -DEPRESSION AND ANXIETY: Depression and anxiety are mental health conditions that you are currently managing with medication and therapy. Continue taking propranolol , Abilify  10 mg daily, Wellbutrin  150 mg twice daily, and Zoloft  100 mg daily. Keep attending your therapy sessions as they are helping you make progress.  INSTRUCTIONS: Please call to reschedule your physical therapy appointment. Follow up with your neurosurgeon after your physical therapy evaluation. Continue with your current medications and therapy sessions. Your diabetic eye exam is scheduled for April. We will continue to monitor your heart condition with annual echocardiograms.    Contains text generated by [employer].   "

## 2024-04-07 ENCOUNTER — Encounter: Payer: Self-pay | Admitting: Internal Medicine

## 2024-04-07 DIAGNOSIS — Q2381 Bicuspid aortic valve: Secondary | ICD-10-CM | POA: Insufficient documentation

## 2024-04-09 ENCOUNTER — Ambulatory Visit (HOSPITAL_COMMUNITY): Admitting: Clinical

## 2024-04-09 ENCOUNTER — Ambulatory Visit: Payer: Self-pay | Admitting: Internal Medicine

## 2024-04-09 ENCOUNTER — Encounter (HOSPITAL_COMMUNITY): Payer: Self-pay

## 2024-04-09 DIAGNOSIS — G8929 Other chronic pain: Secondary | ICD-10-CM

## 2024-04-09 MED ORDER — GABAPENTIN 300 MG PO CAPS: ORAL_CAPSULE | ORAL | 3 refills | Status: AC

## 2024-04-09 NOTE — Telephone Encounter (Signed)
 Gabapentin  last RF 06/08/23 for 6 month worth . Will refill until May   Requested Prescriptions  Pending Prescriptions Disp Refills   gabapentin  (NEURONTIN ) 300 MG capsule 270 capsule 3    Sig: Take 2 capsules (600 mg total) by mouth 3 (three) times daily AND 3 capsules (900 mg total) at bedtime.     There is no refill protocol information for this order

## 2024-04-09 NOTE — Telephone Encounter (Signed)
 Noted.

## 2024-04-09 NOTE — Telephone Encounter (Signed)
 Message from [place] sent at 04/09/2024 11:11 AM EST  Reason for Triage: Patient states her feet has been hurting like crazy. Pain level 10. Can't sleep. Need prior authorization for gabapentin  (NEURONTIN ) 300 MG capsule   Reason for Disposition  [1] Prescription refill request for ESSENTIAL medicine (i.e., likelihood of harm to patient if not taken) AND [2] triager unable to refill per department policy  Answer Assessment - Initial Assessment Questions 1. ONSET: When did the pain start?      1 week  2. LOCATION: Where is the pain located?      Both feet  3. PAIN: How bad is the pain?    (Scale 1-10; or mild, moderate, severe)     10/10 4. WORK OR EXERCISE: Has there been any recent work or exercise that involved this part of the body?      no 5. CAUSE: What do you think is causing the foot pain? Out of Gabapentin  6. OTHER SYMPTOMS: Do you have any other symptoms? (e.g., leg pain, rash, fever, numbness)     Feet numbness to halfway up leg  Answer Assessment - Initial Assessment Questions 1. DRUG NAME: What medicine do you need to have refilled?     Gabapentin  2. REFILLS REMAINING: How many refills are remaining? Notes: The label on the medicine or pill bottle will show how many refills are remaining. If there are no refills remaining, then a renewal may be needed.     no 4. PRESCRIBER: Who prescribed it? Note: The prescribing doctor or group is responsible for refill approvals..     Needs reordering 5. PHARMACY: Have you contacted your pharmacy (drugstore)? Note: Some pharmacies will contact the doctor (or NP/PA).      [facility]  6. SYMPTOMS: Do you have any symptoms?     Feet pain  Protocols used: Foot Pain-A-AH, Medication Refill and Renewal Call-A-AH

## 2024-04-12 ENCOUNTER — Other Ambulatory Visit: Payer: Self-pay | Admitting: Internal Medicine

## 2024-04-12 DIAGNOSIS — E1142 Type 2 diabetes mellitus with diabetic polyneuropathy: Secondary | ICD-10-CM

## 2024-04-16 ENCOUNTER — Ambulatory Visit (HOSPITAL_BASED_OUTPATIENT_CLINIC_OR_DEPARTMENT_OTHER): Admitting: Pulmonary Disease

## 2024-04-17 ENCOUNTER — Telehealth: Payer: Self-pay | Admitting: Internal Medicine

## 2024-04-17 NOTE — Telephone Encounter (Signed)
Called but no answer. Unable to LVM due to no VM set up.

## 2024-04-18 NOTE — Telephone Encounter (Signed)
Called but no answer. Unable to LVM due to no VM set up.

## 2024-04-19 NOTE — Telephone Encounter (Signed)
 Third attempt. Called but no answer. Unable to LVM due to no VM set up.

## 2024-04-19 NOTE — Telephone Encounter (Signed)
 Will address with her on f/u visit.

## 2024-04-26 ENCOUNTER — Telehealth (HOSPITAL_COMMUNITY): Admitting: Student in an Organized Health Care Education/Training Program

## 2024-05-01 ENCOUNTER — Ambulatory Visit (HOSPITAL_BASED_OUTPATIENT_CLINIC_OR_DEPARTMENT_OTHER): Admitting: Pulmonary Disease

## 2024-05-01 ENCOUNTER — Encounter: Admitting: Physical Medicine & Rehabilitation

## 2024-06-13 ENCOUNTER — Ambulatory Visit: Admitting: Orthopedic Surgery

## 2024-06-14 ENCOUNTER — Ambulatory Visit: Admitting: Orthopedic Surgery

## 2024-08-07 ENCOUNTER — Ambulatory Visit: Payer: Self-pay | Admitting: Internal Medicine
